# Patient Record
Sex: Male | Born: 1955 | Race: White | Hispanic: No | Marital: Married | Smoking: Current every day smoker
Health system: Southern US, Community
[De-identification: ages and names within clinical notes are randomized; demographics above are authoritative.]

## PROBLEM LIST (undated history)

## (undated) DIAGNOSIS — E7800 Pure hypercholesterolemia, unspecified: Secondary | ICD-10-CM

## (undated) DIAGNOSIS — N529 Male erectile dysfunction, unspecified: Secondary | ICD-10-CM

## (undated) DIAGNOSIS — E785 Hyperlipidemia, unspecified: Secondary | ICD-10-CM

## (undated) DIAGNOSIS — K603 Anal fistula, unspecified: Secondary | ICD-10-CM

## (undated) DIAGNOSIS — I639 Cerebral infarction, unspecified: Secondary | ICD-10-CM

## (undated) DIAGNOSIS — J449 Chronic obstructive pulmonary disease, unspecified: Secondary | ICD-10-CM

## (undated) DIAGNOSIS — E278 Other specified disorders of adrenal gland: Secondary | ICD-10-CM

## (undated) DIAGNOSIS — K649 Unspecified hemorrhoids: Secondary | ICD-10-CM

## (undated) DIAGNOSIS — E039 Hypothyroidism, unspecified: Secondary | ICD-10-CM

## (undated) DIAGNOSIS — R200 Anesthesia of skin: Secondary | ICD-10-CM

## (undated) DIAGNOSIS — I10 Essential (primary) hypertension: Secondary | ICD-10-CM

## (undated) HISTORY — DX: Other specified disorders of adrenal gland: E27.8

## (undated) HISTORY — DX: Hyperlipidemia, unspecified: E78.5

## (undated) HISTORY — DX: Cerebral infarction, unspecified: I63.9

## (undated) HISTORY — DX: Essential (primary) hypertension: I10

## (undated) HISTORY — DX: Chronic obstructive pulmonary disease, unspecified: J44.9

## (undated) HISTORY — DX: Pure hypercholesterolemia, unspecified: E78.00

## (undated) HISTORY — DX: Male erectile dysfunction, unspecified: N52.9

## (undated) HISTORY — DX: Anesthesia of skin: R20.0

## (undated) HISTORY — PX: COLONOSCOPY: SHX174

---

## 2000-03-31 ENCOUNTER — Encounter (INDEPENDENT_AMBULATORY_CARE_PROVIDER_SITE_OTHER): Payer: Self-pay | Admitting: Gastroenterology

## 2000-03-31 ENCOUNTER — Other Ambulatory Visit: Admission: RE | Admit: 2000-03-31 | Discharge: 2000-03-31 | Payer: Self-pay | Admitting: Gastroenterology

## 2003-04-27 ENCOUNTER — Emergency Department (HOSPITAL_COMMUNITY): Admission: EM | Admit: 2003-04-27 | Discharge: 2003-04-27 | Payer: Self-pay | Admitting: Emergency Medicine

## 2003-11-20 ENCOUNTER — Inpatient Hospital Stay (HOSPITAL_COMMUNITY): Admission: AC | Admit: 2003-11-20 | Discharge: 2003-11-22 | Payer: Self-pay

## 2003-11-21 HISTORY — PX: OTHER SURGICAL HISTORY: SHX169

## 2003-12-27 ENCOUNTER — Ambulatory Visit (HOSPITAL_COMMUNITY): Admission: RE | Admit: 2003-12-27 | Discharge: 2003-12-27 | Payer: Self-pay | Admitting: *Deleted

## 2003-12-27 ENCOUNTER — Ambulatory Visit (HOSPITAL_BASED_OUTPATIENT_CLINIC_OR_DEPARTMENT_OTHER): Admission: RE | Admit: 2003-12-27 | Discharge: 2003-12-27 | Payer: Self-pay | Admitting: *Deleted

## 2003-12-27 HISTORY — PX: OTHER SURGICAL HISTORY: SHX169

## 2005-04-14 ENCOUNTER — Encounter: Payer: Self-pay | Admitting: Gastroenterology

## 2005-08-11 ENCOUNTER — Encounter: Payer: Self-pay | Admitting: Gastroenterology

## 2005-08-11 ENCOUNTER — Inpatient Hospital Stay (HOSPITAL_COMMUNITY): Admission: AD | Admit: 2005-08-11 | Discharge: 2005-08-14 | Payer: Self-pay | Admitting: *Deleted

## 2005-08-11 ENCOUNTER — Encounter (INDEPENDENT_AMBULATORY_CARE_PROVIDER_SITE_OTHER): Payer: Self-pay | Admitting: Specialist

## 2005-08-11 HISTORY — PX: OTHER SURGICAL HISTORY: SHX169
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
## 2006-11-01 ENCOUNTER — Ambulatory Visit: Payer: Self-pay | Admitting: Gastroenterology

## 2006-11-17 ENCOUNTER — Encounter (INDEPENDENT_AMBULATORY_CARE_PROVIDER_SITE_OTHER): Payer: Self-pay | Admitting: *Deleted

## 2006-11-17 ENCOUNTER — Ambulatory Visit: Payer: Self-pay | Admitting: Gastroenterology

## 2009-01-09 ENCOUNTER — Ambulatory Visit: Payer: Self-pay | Admitting: Gastroenterology

## 2009-01-09 DIAGNOSIS — R198 Other specified symptoms and signs involving the digestive system and abdomen: Secondary | ICD-10-CM | POA: Insufficient documentation

## 2009-01-09 DIAGNOSIS — R1031 Right lower quadrant pain: Secondary | ICD-10-CM | POA: Insufficient documentation

## 2009-01-09 DIAGNOSIS — R159 Full incontinence of feces: Secondary | ICD-10-CM | POA: Insufficient documentation

## 2009-01-24 ENCOUNTER — Ambulatory Visit: Payer: Self-pay | Admitting: Gastroenterology

## 2009-01-31 ENCOUNTER — Ambulatory Visit: Payer: Self-pay | Admitting: Gastroenterology

## 2009-01-31 ENCOUNTER — Encounter: Payer: Self-pay | Admitting: Gastroenterology

## 2009-02-04 ENCOUNTER — Telehealth (INDEPENDENT_AMBULATORY_CARE_PROVIDER_SITE_OTHER): Payer: Self-pay

## 2009-02-06 ENCOUNTER — Ambulatory Visit: Payer: Self-pay | Admitting: Internal Medicine

## 2009-02-08 ENCOUNTER — Encounter: Payer: Self-pay | Admitting: Gastroenterology

## 2009-02-10 ENCOUNTER — Telehealth (INDEPENDENT_AMBULATORY_CARE_PROVIDER_SITE_OTHER): Payer: Self-pay

## 2009-02-21 ENCOUNTER — Ambulatory Visit: Payer: Self-pay | Admitting: Endocrinology

## 2009-02-21 DIAGNOSIS — E278 Other specified disorders of adrenal gland: Secondary | ICD-10-CM | POA: Insufficient documentation

## 2009-02-24 ENCOUNTER — Encounter: Payer: Self-pay | Admitting: Internal Medicine

## 2009-02-24 ENCOUNTER — Telehealth (INDEPENDENT_AMBULATORY_CARE_PROVIDER_SITE_OTHER): Payer: Self-pay | Admitting: *Deleted

## 2009-03-03 ENCOUNTER — Ambulatory Visit: Payer: Self-pay | Admitting: Gastroenterology

## 2009-03-03 DIAGNOSIS — K623 Rectal prolapse: Secondary | ICD-10-CM

## 2009-03-03 DIAGNOSIS — R933 Abnormal findings on diagnostic imaging of other parts of digestive tract: Secondary | ICD-10-CM

## 2009-03-10 LAB — CONVERTED CEMR LAB
Catecholamines Tot(E+NE) 24 Hr U: 0.049 mg/24hr
Metaneph Total, Ur: 427 ug/24hr (ref 224–832)

## 2009-04-02 ENCOUNTER — Telehealth: Payer: Self-pay | Admitting: Endocrinology

## 2009-04-18 ENCOUNTER — Ambulatory Visit: Payer: Self-pay | Admitting: Endocrinology

## 2009-04-29 ENCOUNTER — Telehealth: Payer: Self-pay | Admitting: Endocrinology

## 2009-05-01 ENCOUNTER — Ambulatory Visit: Payer: Self-pay | Admitting: Gastroenterology

## 2009-05-06 ENCOUNTER — Ambulatory Visit: Payer: Self-pay | Admitting: Cardiology

## 2009-05-15 ENCOUNTER — Encounter: Payer: Self-pay | Admitting: Gastroenterology

## 2009-05-15 ENCOUNTER — Ambulatory Visit: Payer: Self-pay | Admitting: Gastroenterology

## 2009-05-18 ENCOUNTER — Encounter: Payer: Self-pay | Admitting: Gastroenterology
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
## 2011-01-01 NOTE — Op Note (Signed)
NAME:  Todd Elliott, Todd Elliott               ACCOUNT NO.:  [id number]   MEDICAL RECORD NO.:  [id number]          PATIENT TYPE:  AMB   LOCATION:  DAY                          FACILITY:  [facility]   PHYSICIAN:  Vikki Ports, MDDATE OF BIRTH:  01/15/1956   DATE OF PROCEDURE:  08/11/2005  DATE OF DISCHARGE:                                 OPERATIVE REPORT   PREOPERATIVE DIAGNOSIS:  Acute appendicitis, probable rupture.   POSTOPERATIVE DIAGNOSIS:  Gangrenous ruptured appendicitis.   PROCEDURE:  Laparoscopy converted to open ileocecostomy.   SURGEON:  Danna Hefty, MD.   ASSISTANTWilmon Arms. Tsuei, MD.   ANESTHESIA:  General.   DESCRIPTION OF PROCEDURE:  The patient was taken to the operating room,  placed in supine position and after adequate general anesthesia was induced  using endotracheal tube, the abdomen was prepped and draped in a normal  sterile fashion. Using a 12 mm Optiview trocar in the left upper quadrant,  peritoneal access was obtained using direct vision. Pneumoperitoneum was  obtained and additional 12 and 5 mm trocars were placed in the left abdomen.  The right lower quadrant was inspected and a gangrenous appendix was  visualized. The tip of the appendix was inspected and mobilized. The  mesoappendix was very stuck as was the cecum. On mobilization, an area of  purulence and abscess was encountered. There was obvious fecal contamination  and obvious perforation was encountered as well. At this point, I decided to  convert to an open operation.   I then made a right lower quadrant transverse incision. Dissecting down  through the skin and dermis, I went through the fascia and muscle using  Bovie electrocautery and entered the peritoneum. The right colon was  mobilized. The cecum was densely adhered to the omentum and to the right  paracolic gutter. After mobilizing this with great caution, there was  significant fibrosis and inflammation. The terminal ileum  was transected  using a GIA stapling device and the mesentery was taken down with the  harmonic scalpel. An area of noninflamed right colon which had been  mobilized was transected using the GIA stapling device and the ileocecum was  sent for pathologic evaluation along with the perforated appendix. A side-to-  side ileocolostomy was then performed in the standard fashion using a GIA 75  stapling device and a TA-60 stapling device. The anastomosis was reinforced  using Tisseel. The mesenteric defect was closed with interrupted 2-0 silk  ligatures. The right lower quadrant was copiously irrigated with 3 liters  of saline solution. The fascia was closed in two layers with running #1  Novofil and skin was closed loosely with staples and packed with a Teflon  wicks. The patient tolerated the procedure well and went to PACU in good  condition.      Vikki Ports, MD  Electronically Signed     KRH/MEDQ  D:  08/11/2005  T:  08/12/2005  Job:  [id number]

## 2011-01-01 NOTE — Op Note (Signed)
NAME:  Todd Elliott, Todd Elliott                         ACCOUNT NO.:  [id number]   MEDICAL RECORD NO.:  [id number]                   PATIENT TYPE:  AMB   LOCATION:  DSC                                  FACILITY:  [facility]   PHYSICIAN:  Lowell Bouton, M.D.      DATE OF BIRTH:  02/04/1956   DATE OF PROCEDURE:  DATE OF DISCHARGE:                                 OPERATIVE REPORT   PREOPERATIVE DIAGNOSIS:  Fractured left scaphoid.   POSTOPERATIVE DIAGNOSIS:  Fractured left scaphoid.   PROCEDURE:  Open reduction internal fixation, left scaphoid fracture.   SURGEON:  Lowell Bouton, M.D.   ANESTHESIA:  General.   OPERATIVE FINDINGS:  The patient had a mid shaft scaphoid fracture, that was  minimally displaced.  The fracture was about four weeks old.   PROCEDURE:  Under general anesthesia, with a tourniquet on the left arm, the  left hand was prepped and draped in the usual fashion.  After exsanguinating  the limb, the tourniquet was inflated to 250 mmHg.  A longitudinal incision  was made along the FCR tendon volarly and then oblique to cross the thenar  eminence and the wrist crease.  Sharp dissection was carried through the  subcutaneous tissues and bleeding points were coagulated.  Blunt dissection  was carried down to the FCR tendon sheath, and the sheath was opened with  the scissors.  The tendon was then retracted ulnarly and the floor of the  sheath was incised sharply.  Dissection was carried down onto the scaphoid  tuberosity and a knife was used to elevate the periosteum.  A branch of the  radial artery was tied off with a 4-0 chromic suture that crossed right at  the fracture.  The fracture site was then identified and a Therapist, nutritional  was inserted in the radiocarpal joint.  The fracture was comminuted ulnarly,  but aligned well radially.  It was felt that an AccuTrac screw would be  appropriate and supply good compression.  There were no cystic changes in  the  bone that I could note volarly.  A K-wire was then placed through the  scaphoid tuberosity across the fracture site and into the proximal pole.  X-  ray showed good alignment with good centralization of the K-wire.  A second  K-wire using a 3.5 wire was then placed obliquely across the fracture to  keep the fracture from rotating.  The AccuTrac drill was then placed over  the first K-wire and under power, the track was drilled using the AccuTrac  drill.  Size 27.5 mm screw was then inserted over the K-wire.  X-rays showed  excellent alignment and there was good compression of the fracture site.  It  was determined that no bone graft would be required as there was no defect  in the fracture.  The rotational K-wire was then removed and the screw was  inserted a few turns further.  X-rays showed good alignment in  both the PA,  lateral and oblique views.  The wound was then irrigated copiously with  saline.  A vessel loop drain  was left in for drainage.  Subcutaneous tissues were closed with 4-0 Vicryl,  and the skin with a 3-0 subcuticular Prolene.  Steri-Strips were applied,  followed by dressings and a thumb spica splint.  The patient tolerated the  procedure well, and went to the recovery room awake, stable and in good  condition.                                               Lowell Bouton, M.D.    EMM/MEDQ  D:  12/27/2003  T:  12/28/2003  Job:  [id number]   cc:   Nadara Mustard, M.D.  Fax: [phone]

## 2011-01-01 NOTE — Discharge Summary (Signed)
NAME:  Todd Elliott, Todd Elliott               ACCOUNT NO.:  [id number]   MEDICAL RECORD NO.:  [id number]          PATIENT TYPE:  INP   LOCATION:  1518                         FACILITY:  [facility]   PHYSICIAN:  Vikki Ports, MDDATE OF BIRTH:  08-20-1955   DATE OF ADMISSION:  08/11/2005  DATE OF DISCHARGE:  08/14/2005                                 DISCHARGE SUMMARY   ADMISSION DIAGNOSIS:  Ruptured appendicitis.   DISCHARGE DIAGNOSIS:  Ruptured appendicitis   CONDITION ON DISCHARGE:  Good and improved.   DISPOSITION:  Discharged to home.   MEDICATIONS ON DISCHARGE:  Levothyroxine, Cipro, Flagyl, and Vicodin.   FOLLOW-UP:  With me in four to five days.   HOSPITAL COURSE:  Patient was admitted with a CT diagnosis of ruptured  appendicitis.  He was taken to the operating room.  Attempt at laparoscopic  appendectomy was performed.  The patient had rupture and repair of the  appendiceal base of the cecum.  He was converted to an open ileocecostomy.  He was maintained on IV antibiotics and his diet was advanced over the  following two to three days.  Patient was doing very well, tolerating a  regular diet.  He was a afebrile for over 24 hours, had had a bowel  movement, was anxious to go home.  Wound care instructions were given.  He  was changed to p.o. antibiotics and was discharged home with instructions to  call for a fever of over 101 or any change in his symptomatology.  His white  count was decreased to 12,000 at the time of discharge.      Vikki Ports, MD  Electronically Signed     KRH/MEDQ  D:  08/14/2005  T:  08/16/2005  Job:  [id number]

## 2011-01-01 NOTE — H&P (Signed)
NAME:  Todd Elliott, Todd Elliott               ACCOUNT NO.:  [id number]   MEDICAL RECORD NO.:  [id number]          PATIENT TYPE:  AMB   LOCATION:  DAY                          [facility]ACILITY:  [facility]   PHYSICIAN:  Vikki Ports, MDDATE OF BIRTH:  June 06, 1956   DATE OF ADMISSION:  08/11/2005  DATE OF DISCHARGE:                                HISTORY & PHYSICAL   HISTORY OF PRESENT ILLNESS:  The patient is a 55 year old white male who  began with right upper quadrant pain localizing to the right lower quadrant.  He was seen by Dr. Herb Grays earlier today with an onset of pain six days  ago in the right lower quadrant which then localized about three days ago to  the right lower quadrant.  The patient had increasing pain over the last 48  hours and was seen in her office today.  He had a white count of 11,000 and  a temperature of 99 degrees.  He was sent for CT scan which showed acute  appendicitis with early rupture.  He was sent to the [facility]  emergency room and prepared for laparoscopic appendectomy.  The patient has  complained of anorexia, did have one episode of emesis.  He did use on  laxative when he complained of constipation.   PAST MEDICAL HISTORY:  Significant only for urolithiasis.  He does have a  questionable history of cholelithiasis.   MEDICATIONS:  None.   ALLERGIES:  None.   SOCIAL HISTORY:  Negative for tobacco and alcohol use.   FAMILY HISTORY:  Noncontributory.   PHYSICAL EXAMINATION:  VITAL SIGNS:  His temperature was 99 degrees.  His  heart rate was 88.  Respiratory rate was 18.  Blood pressure was 146/72.  GENERAL:  He is an age appropriate white male in minimal distress.  HEENT:  Benign.  Normocephalic, atraumatic.  Pupils were equal, round,  reactive to light.  NECK:  Supple and soft without thyromegaly or cervical adenopathy.  LUNGS:  Clear to auscultation and percussion x2.  ABDOMEN:  Soft and tender in the right lower quadrant to minimal  palpation  with localized peritonitis.  EXTREMITIES:  Without clubbing, cyanosis, or edema.  NEUROLOGIC:  Grossly intact.   White count was 11,000.   IMPRESSION:  Acute appendicitis with early rupture.   PLAN OF ACTION:  Laparoscopic appendectomy with possible open appendectomy.  Risks and indications, benefits and options were explained to the patient  and he wishes to proceed.      Vikki Ports, MD  Electronically Signed     KRH/MEDQ  D:  08/11/2005  T:  08/11/2005  Job:  [id number]
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
## 2011-08-17 HISTORY — PX: CHOLECYSTECTOMY: SHX55

## 2011-08-17 HISTORY — PX: VENTRAL HERNIA REPAIR: SHX424

## 2011-11-05 ENCOUNTER — Encounter (INDEPENDENT_AMBULATORY_CARE_PROVIDER_SITE_OTHER): Payer: Self-pay | Admitting: General Surgery

## 2013-11-22 ENCOUNTER — Encounter: Payer: Self-pay | Admitting: Gastroenterology

## 2013-12-24 ENCOUNTER — Encounter (HOSPITAL_BASED_OUTPATIENT_CLINIC_OR_DEPARTMENT_OTHER): Payer: Self-pay | Admitting: Emergency Medicine

## 2013-12-24 ENCOUNTER — Emergency Department (HOSPITAL_BASED_OUTPATIENT_CLINIC_OR_DEPARTMENT_OTHER)
Admission: EM | Admit: 2013-12-24 | Discharge: 2013-12-24 | Disposition: A | Discharge status: Home / Self Care | Payer: BC Managed Care – PPO | Attending: Emergency Medicine | Admitting: Emergency Medicine

## 2013-12-24 DIAGNOSIS — E079 Disorder of thyroid, unspecified: Secondary | ICD-10-CM | POA: Insufficient documentation

## 2013-12-24 DIAGNOSIS — K644 Residual hemorrhoidal skin tags: Secondary | ICD-10-CM | POA: Insufficient documentation

## 2013-12-24 DIAGNOSIS — F17200 Nicotine dependence, unspecified, uncomplicated: Secondary | ICD-10-CM | POA: Insufficient documentation

## 2013-12-24 DIAGNOSIS — R198 Other specified symptoms and signs involving the digestive system and abdomen: Secondary | ICD-10-CM | POA: Insufficient documentation

## 2013-12-24 HISTORY — DX: Unspecified hemorrhoids: K64.9

## 2013-12-24 MED ORDER — LIDOCAINE HCL 2 % EX GEL
Freq: Once | CUTANEOUS | Status: AC
  Administered 2013-12-24: 1 via TOPICAL
  Filled 2013-12-24: qty 20

## 2013-12-24 NOTE — ED Notes (Signed)
Pt reports 2 weeks of rectal pressure.  Went to [facility] and was sent here for further eval.  Hx of hemorrhoids.  States stools are small semiformed.  Reports hemoccult at [facility] was positive.

## 2013-12-24 NOTE — Discharge Instructions (Signed)
Start doing a warm water soak several times daily, especially when your pain is worsening - this may relieve your symptoms.  Follow up with your doctor at [facility] as discussed.  Also,  You may try your flexeril  - this may also be helpful.

## 2013-12-24 NOTE — ED Provider Notes (Signed)
CSN: [id number]     Arrival date & time 12/24/13  1505 History   First MD Initiated Contact with Patient 12/24/13 1746     Chief Complaint  Patient presents with  . Rectal Pain     (Consider location/radiation/quality/duration/timing/severity/associated sxs/prior Treatment) The history is provided by the patient.   Todd Elliott is a 58 y.o. male presenting with rectal pain which started early this am,  Waking him up.  He describes intermittent episodes of severe, sharp, cramping pain and pressure in his rectum which have been recurring, with several similar episodes over the past month.  He has a history of hemorrhoids with a hemorrhoidectomy many years ago, and reports he was able to palpate a swelling along his right medial buttock near his rectum when the pain was severe this am, but since the swelling has resolved.  He still has soreness at this location with palpation,  Stating it feels "bruise like".  He denies problems with constipation or diarrhea, had a small stool today without change in symptoms.  He denies back pain, dysuria, hematuria and rectal bleeding. He was seen by his pcp today, however,  Was found to be hemoccult positive and was sent here for further testing.  He has had screening colonoscopies, most recently 4 years ago and states he is due for one this year.     Past Medical History  Diagnosis Date  . Hemorrhoid   . Thyroid disease    Past Surgical History  Procedure Laterality Date  . Arm surgery    . Appendectomy    . Abdominal surgery    . Gastric resection    . Hernia repair     No family history on file. History  Substance Use Topics  . Smoking status: Light Tobacco Smoker  . Smokeless tobacco: Not on file  . Alcohol Use: Yes    Review of Systems  Constitutional: Negative for fever.  HENT: Negative for congestion and sore throat.   Eyes: Negative.   Respiratory: Negative for chest tightness and shortness of breath.   Cardiovascular: Negative  for chest pain.  Gastrointestinal: Positive for rectal pain. Negative for nausea, abdominal pain, diarrhea, constipation and anal bleeding.  Genitourinary: Negative.   Musculoskeletal: Negative for arthralgias, joint swelling and neck pain.  Skin: Negative.  Negative for rash and wound.  Neurological: Negative for dizziness, weakness, light-headedness, numbness and headaches.  Psychiatric/Behavioral: Negative.       Allergies  Review of patient's allergies indicates no known allergies.  Home Medications   Prior to Admission medications   Medication Sig Start Date End Date Taking? Authorizing Provider  levothyroxine (SYNTHROID, LEVOTHROID) 175 MCG tablet Take 175 mcg by mouth daily before breakfast.   Yes Historical Provider, MD   BP 167/99  Pulse 94  Temp(Src) 98.7 F (37.1 C) (Oral)  Resp 18  Ht 5\' 10"  (1.778 m)  Wt 218 lb (98.884 kg)  BMI 31.28 kg/m2  SpO2 100% Physical Exam  Nursing note and vitals reviewed. Constitutional: He appears well-developed and well-nourished.  HENT:  Head: Normocephalic and atraumatic.  Eyes: Conjunctivae are normal.  Neck: Normal range of motion.  Cardiovascular: Normal rate, regular rhythm, normal heart sounds and intact distal pulses.   Pulmonary/Chest: Effort normal and breath sounds normal. He has no wheezes.  Abdominal: Soft. Bowel sounds are normal. He exhibits no distension and no mass. There is no tenderness. There is no rebound and no guarding.  Genitourinary: Rectal exam shows external hemorrhoid. Rectal exam shows no  internal hemorrhoid, no fissure, no mass, no tenderness and anal tone normal. Prostate is not enlarged and not tender. Cremasteric reflex is present.  Small external hemorrhoidal flap,  Not tense,  No hematoma, more consistent with a healed old hemorrhoid rather than a new one.  No internal hemorrhoid appreciated.  Prostate nontender.  TTP at left buttock around the 5 oclock position near the anus.  No edema, fluctuance,  erythema or induration.  Few scattered folliculitis lesions but no abscess.    Musculoskeletal: Normal range of motion.  Neurological: He is alert.  Skin: Skin is warm and dry.  Psychiatric: He has a normal mood and affect.    ED Course  Procedures (including critical care time) Labs Review Labs Reviewed - No data to display  Imaging Review No results found.   EKG Interpretation None      MDM   Final diagnoses:  Rectal tenesmus    Discussed with Dr. Doy Mince prior to dc home.  Pt was given topical lidocaine jelly with small amount applied to rectal area.  He did obtain partial pain relief.  At time of dc,  His pain was improved,  Stating no pain with lying and standing, only with sitting.  He was offered pain medicine prescription which he declined.  Plan to f/u with [facility] for further evaluation of these intermittent episodes of pain.  No appreciable hemorrhoidal findings to suggest this as source of pain.  No abscess, no hx of falls to suggest pelvic bony source of pain.  Suspect rectal tenesmus. He does have prescription of flexeril for prn back pain,  Suggested he try this medicine, may also do warm sitz baths for muscle spasm relief.      Evalee Jefferson, PA-C 12/25/13 2312

## 2013-12-25 ENCOUNTER — Telehealth: Payer: Self-pay | Admitting: Gastroenterology

## 2013-12-25 NOTE — Telephone Encounter (Signed)
Patient reports rectal pain and pressure.  States he can't sit.  He was seen in the ER but note not completed.  He was given lidocaine and not able to tolerate the pain.  He will come in and see Alonza Bogus, PA tomorrow at 2;00

## 2013-12-26 ENCOUNTER — Ambulatory Visit (INDEPENDENT_AMBULATORY_CARE_PROVIDER_SITE_OTHER): Payer: BC Managed Care – PPO | Admitting: Gastroenterology

## 2013-12-26 ENCOUNTER — Encounter: Payer: Self-pay | Admitting: Gastroenterology

## 2013-12-26 ENCOUNTER — Other Ambulatory Visit (INDEPENDENT_AMBULATORY_CARE_PROVIDER_SITE_OTHER): Payer: BC Managed Care – PPO

## 2013-12-26 ENCOUNTER — Ambulatory Visit (INDEPENDENT_AMBULATORY_CARE_PROVIDER_SITE_OTHER)
Admission: RE | Admit: 2013-12-26 | Discharge: 2013-12-26 | Disposition: A | Discharge status: Home / Self Care | Payer: BC Managed Care – PPO | Source: Ambulatory Visit | Attending: Gastroenterology | Admitting: Gastroenterology

## 2013-12-26 VITALS — BP 124/70 | HR 70 | Ht 70.0 in | Wt 215.4 lb

## 2013-12-26 DIAGNOSIS — K6289 Other specified diseases of anus and rectum: Secondary | ICD-10-CM

## 2013-12-26 DIAGNOSIS — Z860100 Personal history of colon polyps, unspecified: Secondary | ICD-10-CM | POA: Insufficient documentation

## 2013-12-26 DIAGNOSIS — Z1211 Encounter for screening for malignant neoplasm of colon: Secondary | ICD-10-CM

## 2013-12-26 LAB — BASIC METABOLIC PANEL
BUN: 17 mg/dL (ref 6–23)
CO2: 29 mEq/L (ref 19–32)
CREATININE: 1.1 mg/dL (ref 0.4–1.5)
Calcium: 9.4 mg/dL (ref 8.4–10.5)
Chloride: 104 mEq/L (ref 96–112)
GFR: 73.94 mL/min (ref >60.00)
GLUCOSE: 92 mg/dL (ref 70–99)
POTASSIUM: 3.9 meq/L (ref 3.5–5.1)
Sodium: 140 mEq/L (ref 135–145)

## 2013-12-26 LAB — CBC
HEMATOCRIT: 45.9 % (ref 39.0–52.0)
HEMOGLOBIN: 15.5 g/dL (ref 13.0–17.0)
MCHC: 33.8 g/dL (ref 30.0–36.0)
MCV: 88.8 fl (ref 78.0–100.0)
PLATELETS: 305 10*3/uL (ref 150.0–400.0)
RBC: 5.17 Mil/uL (ref 4.22–5.81)
RDW: 12.7 % (ref 11.5–15.5)
WBC: 12.7 10*3/uL — ABNORMAL HIGH (ref 4.0–10.5)

## 2013-12-26 MED ORDER — TRAMADOL HCL 50 MG PO TABS: 50.0000 mg | ORAL_TABLET | Freq: Four times a day (QID) | ORAL | Status: DC | PRN

## 2013-12-26 MED ORDER — MOVIPREP 100 G PO SOLR: 1.0000 | Freq: Once | ORAL | Status: DC

## 2013-12-26 MED ORDER — IOHEXOL 300 MG/ML  SOLN
80.0000 mL | Freq: Once | INTRAMUSCULAR | Status: AC | PRN
  Administered 2013-12-26: 80 mL via INTRAVENOUS

## 2013-12-26 NOTE — Patient Instructions (Signed)
Your physician has requested that you go to the basement for the following lab work before leaving today: CBC BMET  We have sent the following medications to your pharmacy for you to pick up at your convenience: Tramadol 50 mg, please take one tablet by mouth as needed for pain ___________________________________________________________________________________________ You have been scheduled for a CT scan of the pelvis at [facility] ([address]---this is in the same building as Press [facility]).   You are scheduled on 12-26-2013 at 4 pm. You should arrive 15 minutes prior to your appointment time for registration. Please follow the written instructions below on the day of your exam:  WARNING: IF YOU ARE ALLERGIC TO IODINE/X-RAY DYE, PLEASE NOTIFY RADIOLOGY IMMEDIATELY AT [phone]! YOU WILL BE GIVEN A 13 HOUR PREMEDICATION PREP.  1) Do not eat or drink anything. 2) You have been given 2 bottles of oral contrast to drink. The solution may taste better if refrigerated, but do NOT add ice or any other liquid to this solution. Shake well before drinking.   Drank the contrast-one bottle here  You may take any medications as prescribed with a small amount of water except for the following: Metformin, Glucophage, Glucovance, Avandamet, Riomet, Fortamet, Actoplus Met, Janumet, Glumetza or Metaglip. The above medications must be held the day of the exam AND 48 hours after the exam.  The purpose of you drinking the oral contrast is to aid in the visualization of your intestinal tract. The contrast solution may cause some diarrhea. Before your exam is started, you will be given a small amount of fluid to drink. Depending on your individual set of symptoms, you may also receive an intravenous injection of x-ray contrast/dye. Plan on being at [facility] for 30 minutes or long, depending on the type of exam you are having performed.  This test typically takes 30-45 minutes to  complete.  If you have any questions regarding your exam or if you need to reschedule, you may call the CT department at [phone] between the hours of 8:00 am and 5:00 pm, Monday-Friday.  ________________________________________________________________________

## 2013-12-26 NOTE — Progress Notes (Addendum)
12/26/2013 Todd Elliott [id number] Sep 02, 1955   HISTORY OF PRESENT ILLNESS:  This is a pleasant 58 year old male who is known to Dr. Fuller Plan for previous colonoscopies.  Last colonoscopy was in June 2010 at which time he was found to have a nodular and firm mucosa in the rectum, along with 4 polyps in the sigmoid colon, and internal hemorrhoids.  The sigmoid polyps were hyperplastic. Biopsy the rectum showed findings consistent with mild mucosal prolapse without significant active inflammation, etc. He then again underwent a flexible sigmoidoscopy in September 2010 at which time the nodular mucosa was also noted in the rectum.  At that time biopsies revealed polypoid fragments of rectal mucosa with hyperplastic/degenerative changes, focal ulceration, inflammation and superficial hemorrhages in the lamina propria and a few fragments of surface ulceration and focal active mucosal proctitis. From what I can tell he has never had any adenomatous polyps, but he has had several polyps on different occasions. He does have a family history of colon cancer in his father who was diagnosed at age 54. He has been getting surveillance colonoscopies approximately every 5 years with repeat recommended in June 2015.  He presents to our office today with complaints of rectal pain that began on Monday and woke him from sleep around 3 AM. He states that a couple weeks ago he was having issues with what he thought were his hemorrhoids and began using hemorrhoid suppositories. Symptoms seemed to subside, but then Monday he was woken by this pain. The patient reports that the pain was initially rated as a 12/10 on the pain scale. He cannot sit without significant pain. He went to the emergency department on Monday, May 11 where they performed a rectal exam and they told him that he likely had rectal spasm/tenesmus. He was given topical lidocaine jelly to use, but says that he has not received any relief from that. He was offered  pain medication but declined. He says that the pain feels as if someone shoves a golf ball inside his rectum and feels like a fullness/pressure. He denies any burning pain and there is really not much pain with a bowel movement. He says having a bowel movement actually somewhat relieves the discomfort. He has seen some pinkish colored blood mixed with mucus coming from his rectum as well.  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    Past Medical History  Diagnosis Date  . Hemorrhoid   . Thyroid disease    Past Surgical History  Procedure Laterality Date  . Arm surgery Right   . Appendectomy    . Abdominal surgery    . Gastric resection    . Hernia repair Right     reports that he has been smoking Cigarettes.  He has been smoking about 0.00 packs per day for the past 35 years. He has never used smokeless tobacco. He reports that he drinks alcohol. He reports that he does not use illicit drugs. family history includes Colon cancer (age of onset: 31) in his father; Diabetes in his maternal aunt, maternal uncle, and another family member; Heart disease in his maternal grandfather. No Known Allergies    Outpatient Encounter Prescriptions as of 12/26/2013  Medication Sig  . levothyroxine (SYNTHROID, LEVOTHROID) 175 MCG tablet Take 175 mcg by mouth daily before breakfast.  . Multiple Vitamins-Minerals (CENTRUM SILVER PO) Take 1 tablet by mouth daily.  . Pyridoxine HCl (B-6 PO) Take 1 tablet by mouth daily.  Marland Kitchen MOVIPREP  100 G SOLR Take 1 kit (200 g total) by mouth once.  . traMADol (ULTRAM) 50 MG tablet Take 1 tablet (50 mg total) by mouth every 6 (six) hours as needed.     REVIEW OF SYSTEMS  : All other systems reviewed and negative except where noted in the History of Present Illness.   PHYSICAL EXAM: BP 124/70  Pulse 70  Ht $R'5\' 10"'da$  (1.778 m)  Wt 215 lb 6.4 oz (97.705 kg)  BMI 30.91 kg/m2 General: Well developed white male in no acute  distress Head: Normocephalic and atraumatic Eyes:  Sclerae anicteric, conjunctiva pink. Ears: Normal auditory acuity Lungs: Clear throughout to auscultation Heart: Regular rate and rhythm Abdomen: Soft, non-distended.  Normal bowel sounds.  Non-tender. Rectal:  Small external hemorrhoid noted.  DRE revealed no massa, but there was tenderness and fullness on the left side of the anal canal.  Also, there was tenderness and almost a swollen area palpated on the left buttock.   Musculoskeletal: Symmetrical with no gross deformities  Skin: No lesions on visible extremities Extremities: No edema  Neurological: Alert oriented x 4, grossly non-focal Psychological:  Alert and cooperative. Normal mood and affect  ASSESSMENT AND PLAN: -Rectal pain:  Unsure of the source.  I am concerned about an abscess although he does not have fever.  Will check labs including a CBC and BMP.  Will check CT scan of the pelvis with contrast.  We will also give him some tramadol for pain to use in the interim. -Personal history of colon polyps:  Surveillance colonoscopy is recommended for June 2015 so we will proceed with scheduling that as well.  The risks, benefits, and alternatives were discussed with the patient and he consents to proceed.

## 2013-12-26 NOTE — Progress Notes (Signed)
Reviewed and agree with management plan.  Johnjoseph Rolfe T. Perina Salvaggio, MD FACG 

## 2013-12-27 ENCOUNTER — Telehealth (INDEPENDENT_AMBULATORY_CARE_PROVIDER_SITE_OTHER): Payer: Self-pay

## 2013-12-27 ENCOUNTER — Telehealth: Payer: Self-pay | Admitting: *Deleted

## 2013-12-27 ENCOUNTER — Encounter (HOSPITAL_COMMUNITY): Payer: Self-pay | Admitting: Emergency Medicine

## 2013-12-27 ENCOUNTER — Emergency Department (HOSPITAL_COMMUNITY): Payer: BC Managed Care – PPO | Admitting: Anesthesiology

## 2013-12-27 ENCOUNTER — Observation Stay (HOSPITAL_COMMUNITY)
Admission: EM | Admit: 2013-12-27 | Discharge: 2013-12-28 | Disposition: A | Discharge status: Home / Self Care | Payer: BC Managed Care – PPO | Attending: General Surgery | Admitting: General Surgery

## 2013-12-27 ENCOUNTER — Encounter (INDEPENDENT_AMBULATORY_CARE_PROVIDER_SITE_OTHER): Payer: BC Managed Care – PPO | Admitting: Surgery

## 2013-12-27 ENCOUNTER — Encounter (HOSPITAL_COMMUNITY): Payer: BC Managed Care – PPO | Admitting: Anesthesiology

## 2013-12-27 ENCOUNTER — Other Ambulatory Visit: Payer: Self-pay | Admitting: *Deleted

## 2013-12-27 ENCOUNTER — Encounter (HOSPITAL_COMMUNITY)
Admission: EM | Disposition: A | Discharge status: Home / Self Care | Payer: Self-pay | Source: Home / Self Care | Attending: Emergency Medicine

## 2013-12-27 DIAGNOSIS — K620 Anal polyp: Secondary | ICD-10-CM

## 2013-12-27 DIAGNOSIS — F17200 Nicotine dependence, unspecified, uncomplicated: Secondary | ICD-10-CM | POA: Insufficient documentation

## 2013-12-27 DIAGNOSIS — Z800 Family history of malignant neoplasm of digestive organs: Secondary | ICD-10-CM | POA: Insufficient documentation

## 2013-12-27 DIAGNOSIS — Z9089 Acquired absence of other organs: Secondary | ICD-10-CM | POA: Insufficient documentation

## 2013-12-27 DIAGNOSIS — K610 Anal abscess: Secondary | ICD-10-CM

## 2013-12-27 DIAGNOSIS — K649 Unspecified hemorrhoids: Secondary | ICD-10-CM | POA: Insufficient documentation

## 2013-12-27 DIAGNOSIS — K621 Rectal polyp: Secondary | ICD-10-CM

## 2013-12-27 DIAGNOSIS — K612 Anorectal abscess: Principal | ICD-10-CM | POA: Insufficient documentation

## 2013-12-27 DIAGNOSIS — E079 Disorder of thyroid, unspecified: Secondary | ICD-10-CM | POA: Insufficient documentation

## 2013-12-27 DIAGNOSIS — K611 Rectal abscess: Secondary | ICD-10-CM | POA: Diagnosis present

## 2013-12-27 HISTORY — PX: INCISION AND DRAINAGE PERIRECTAL ABSCESS: SHX1804

## 2013-12-27 LAB — COMPREHENSIVE METABOLIC PANEL
ALBUMIN: 3.8 g/dL (ref 3.5–5.2)
ALK PHOS: 96 U/L (ref 39–117)
ALT: 21 U/L (ref 0–53)
AST: 36 U/L (ref 0–37)
BUN: 16 mg/dL (ref 6–23)
CALCIUM: 9.7 mg/dL (ref 8.4–10.5)
CO2: 22 mEq/L (ref 19–32)
Chloride: 102 mEq/L (ref 96–112)
Creatinine, Ser: 0.82 mg/dL (ref 0.50–1.35)
GFR calc Af Amer: >90 mL/min (ref >90)
GFR calc non Af Amer: >90 mL/min (ref >90)
GLUCOSE: 95 mg/dL (ref 70–99)
Potassium: 4.7 mEq/L (ref 3.7–5.3)
SODIUM: 138 meq/L (ref 137–147)
TOTAL PROTEIN: 7.1 g/dL (ref 6.0–8.3)
Total Bilirubin: 0.3 mg/dL (ref 0.3–1.2)

## 2013-12-27 LAB — CBC
HCT: 45.1 % (ref 39.0–52.0)
Hemoglobin: 15.6 g/dL (ref 13.0–17.0)
MCH: 30.4 pg (ref 26.0–34.0)
MCHC: 34.6 g/dL (ref 30.0–36.0)
MCV: 87.9 fL (ref 78.0–100.0)
PLATELETS: 251 10*3/uL (ref 150–400)
RBC: 5.13 MIL/uL (ref 4.22–5.81)
RDW: 12.4 % (ref 11.5–15.5)
WBC: 11.9 10*3/uL — ABNORMAL HIGH (ref 4.0–10.5)

## 2013-12-27 LAB — PROTIME-INR
INR: 0.97 (ref 0.00–1.49)
Prothrombin Time: 12.7 seconds (ref 11.6–15.2)

## 2013-12-27 LAB — APTT: aPTT: 33 seconds (ref 24–37)

## 2013-12-27 SURGERY — INCISION AND DRAINAGE, ABSCESS, PERIRECTAL
Anesthesia: General | Site: Perineum | Laterality: Left

## 2013-12-27 MED ORDER — CISATRACURIUM BESYLATE 20 MG/10ML IV SOLN
INTRAVENOUS | Status: AC
  Filled 2013-12-27: qty 10

## 2013-12-27 MED ORDER — CISATRACURIUM BESYLATE (PF) 10 MG/5ML IV SOLN
INTRAVENOUS | Status: DC | PRN
  Administered 2013-12-27: 4 mg via INTRAVENOUS

## 2013-12-27 MED ORDER — 0.9 % SODIUM CHLORIDE (POUR BTL) OPTIME
TOPICAL | Status: DC | PRN
  Administered 2013-12-27: 1000 mL

## 2013-12-27 MED ORDER — FENTANYL CITRATE 0.05 MG/ML IJ SOLN
INTRAMUSCULAR | Status: AC
  Filled 2013-12-27: qty 5

## 2013-12-27 MED ORDER — LIDOCAINE HCL (CARDIAC) 20 MG/ML IV SOLN
INTRAVENOUS | Status: AC
  Filled 2013-12-27: qty 5

## 2013-12-27 MED ORDER — NEOSTIGMINE METHYLSULFATE 10 MG/10ML IV SOLN
INTRAVENOUS | Status: DC | PRN
  Administered 2013-12-27: 3 mg via INTRAVENOUS

## 2013-12-27 MED ORDER — DEXTROSE 5 % IV SOLN
INTRAVENOUS | Status: AC
  Filled 2013-12-27: qty 2

## 2013-12-27 MED ORDER — MIDAZOLAM HCL 5 MG/5ML IJ SOLN
INTRAMUSCULAR | Status: DC | PRN
  Administered 2013-12-27: 2 mg via INTRAVENOUS

## 2013-12-27 MED ORDER — MEPERIDINE HCL 50 MG/ML IJ SOLN: 6.2500 mg | INTRAMUSCULAR | Status: DC | PRN

## 2013-12-27 MED ORDER — ONDANSETRON HCL 4 MG/2ML IJ SOLN
INTRAMUSCULAR | Status: AC
  Filled 2013-12-27: qty 2

## 2013-12-27 MED ORDER — PROPOFOL 10 MG/ML IV BOLUS
INTRAVENOUS | Status: AC
  Filled 2013-12-27: qty 20

## 2013-12-27 MED ORDER — BUPIVACAINE-EPINEPHRINE (PF) 0.25% -1:200000 IJ SOLN
INTRAMUSCULAR | Status: DC | PRN
  Administered 2013-12-27: 30 mL

## 2013-12-27 MED ORDER — PROPOFOL 10 MG/ML IV BOLUS
INTRAVENOUS | Status: DC | PRN
  Administered 2013-12-27: 200 mg via INTRAVENOUS
  Administered 2013-12-27: 70 mg via INTRAVENOUS

## 2013-12-27 MED ORDER — LACTATED RINGERS IV SOLN
INTRAVENOUS | Status: DC | PRN
  Administered 2013-12-27: 13:00:00 via INTRAVENOUS

## 2013-12-27 MED ORDER — ACETAMINOPHEN 650 MG RE SUPP: 650.0000 mg | Freq: Four times a day (QID) | RECTAL | Status: DC | PRN

## 2013-12-27 MED ORDER — BUPIVACAINE-EPINEPHRINE (PF) 0.25% -1:200000 IJ SOLN
INTRAMUSCULAR | Status: AC
  Filled 2013-12-27: qty 30

## 2013-12-27 MED ORDER — LACTATED RINGERS IV SOLN: INTRAVENOUS | Status: DC

## 2013-12-27 MED ORDER — FENTANYL CITRATE 0.05 MG/ML IJ SOLN: 25.0000 ug | INTRAMUSCULAR | Status: DC | PRN

## 2013-12-27 MED ORDER — MORPHINE SULFATE 10 MG/ML IJ SOLN: 2.0000 mg | INTRAMUSCULAR | Status: DC | PRN

## 2013-12-27 MED ORDER — PIPERACILLIN-TAZOBACTAM 3.375 G IVPB
3.3750 g | Freq: Three times a day (TID) | INTRAVENOUS | Status: DC
  Administered 2013-12-27 – 2013-12-28 (×2): 3.375 g via INTRAVENOUS
  Filled 2013-12-27 (×4): qty 50

## 2013-12-27 MED ORDER — GLYCOPYRROLATE 0.2 MG/ML IJ SOLN
INTRAMUSCULAR | Status: AC
  Filled 2013-12-27: qty 2

## 2013-12-27 MED ORDER — CEFOXITIN SODIUM 1 G IV SOLR
1.0000 g | INTRAVENOUS | Status: DC | PRN
  Administered 2013-12-27: 2 g via INTRAVENOUS

## 2013-12-27 MED ORDER — FENTANYL CITRATE 0.05 MG/ML IJ SOLN
INTRAMUSCULAR | Status: DC | PRN
  Administered 2013-12-27: 100 ug via INTRAVENOUS

## 2013-12-27 MED ORDER — MIDAZOLAM HCL 2 MG/2ML IJ SOLN
INTRAMUSCULAR | Status: AC
Start: 2013-12-27 — End: 2013-12-27
  Filled 2013-12-27: qty 2

## 2013-12-27 MED ORDER — ONDANSETRON HCL 4 MG/2ML IJ SOLN
INTRAMUSCULAR | Status: DC | PRN
  Administered 2013-12-27: 4 mg via INTRAVENOUS

## 2013-12-27 MED ORDER — OXYCODONE HCL 5 MG PO TABS
5.0000 mg | ORAL_TABLET | ORAL | Status: DC | PRN
  Administered 2013-12-28 (×2): 5 mg via ORAL
  Filled 2013-12-27 (×2): qty 1

## 2013-12-27 MED ORDER — DIPHENHYDRAMINE HCL 12.5 MG/5ML PO ELIX: 12.5000 mg | ORAL_SOLUTION | Freq: Four times a day (QID) | ORAL | Status: DC | PRN

## 2013-12-27 MED ORDER — GLYCOPYRROLATE 0.2 MG/ML IJ SOLN
INTRAMUSCULAR | Status: DC | PRN
  Administered 2013-12-27: .4 mg via INTRAVENOUS

## 2013-12-27 MED ORDER — DIPHENHYDRAMINE HCL 50 MG/ML IJ SOLN: 12.5000 mg | Freq: Four times a day (QID) | INTRAMUSCULAR | Status: DC | PRN

## 2013-12-27 MED ORDER — ONDANSETRON HCL 4 MG/2ML IJ SOLN: 4.0000 mg | Freq: Four times a day (QID) | INTRAMUSCULAR | Status: DC | PRN

## 2013-12-27 MED ORDER — ACETAMINOPHEN 325 MG PO TABS: 650.0000 mg | ORAL_TABLET | Freq: Four times a day (QID) | ORAL | Status: DC | PRN

## 2013-12-27 MED ORDER — PROMETHAZINE HCL 25 MG/ML IJ SOLN: 6.2500 mg | INTRAMUSCULAR | Status: DC | PRN

## 2013-12-27 MED ORDER — LIDOCAINE HCL (CARDIAC) 20 MG/ML IV SOLN
INTRAVENOUS | Status: DC | PRN
  Administered 2013-12-27: 50 mg via INTRAVENOUS

## 2013-12-27 MED ORDER — LEVOTHYROXINE SODIUM 175 MCG PO TABS
175.0000 ug | ORAL_TABLET | Freq: Every day | ORAL | Status: DC
  Administered 2013-12-28: 175 ug via ORAL
  Filled 2013-12-27 (×2): qty 1

## 2013-12-27 MED ORDER — SUCCINYLCHOLINE CHLORIDE 20 MG/ML IJ SOLN
INTRAMUSCULAR | Status: DC | PRN
  Administered 2013-12-27: 140 mg via INTRAVENOUS

## 2013-12-27 SURGICAL SUPPLY — 33 items
BLADE HEX COATED 2.75 (ELECTRODE) ×2 IMPLANT
BLADE SURG 15 STRL LF DISP TIS (BLADE) ×1 IMPLANT
BLADE SURG 15 STRL SS (BLADE) ×2
CANISTER SUCTION 2500CC (MISCELLANEOUS) ×1 IMPLANT
COVER SURGICAL LIGHT HANDLE (MISCELLANEOUS) ×3 IMPLANT
DRSG TELFA 3X8 NADH (GAUZE/BANDAGES/DRESSINGS) ×2 IMPLANT
ELECT REM PT RETURN 9FT ADLT (ELECTROSURGICAL) ×2
ELECTRODE REM PT RTRN 9FT ADLT (ELECTROSURGICAL) ×1 IMPLANT
GAUZE PACKING IODOFORM 1/2 (PACKING) ×1 IMPLANT
GAUZE PACKING IODOFORM 1/4X15 (GAUZE/BANDAGES/DRESSINGS) ×1 IMPLANT
GAUZE SPONGE 4X4 16PLY XRAY LF (GAUZE/BANDAGES/DRESSINGS) ×2 IMPLANT
GLOVE BIOGEL PI IND STRL 7.0 (GLOVE) ×1 IMPLANT
GLOVE BIOGEL PI INDICATOR 7.0 (GLOVE) ×2
GLOVE ECLIPSE 6.5 STRL STRAW (GLOVE) ×1 IMPLANT
GLOVE SS BIOGEL STRL SZ 7.5 (GLOVE) IMPLANT
GLOVE SUPERSENSE BIOGEL SZ 7.5 (GLOVE) ×1
GOWN STRL REUS W/TWL LRG LVL3 (GOWN DISPOSABLE) ×1 IMPLANT
GOWN STRL REUS W/TWL XL LVL3 (GOWN DISPOSABLE) ×4 IMPLANT
KIT BASIN OR (CUSTOM PROCEDURE TRAY) ×2 IMPLANT
LUBRICANT JELLY K Y 4OZ (MISCELLANEOUS) ×1 IMPLANT
NDL HYPO 25X1 1.5 SAFETY (NEEDLE) IMPLANT
NEEDLE HYPO 25X1 1.5 SAFETY (NEEDLE) ×2 IMPLANT
PACK LITHOTOMY IV (CUSTOM PROCEDURE TRAY) ×2 IMPLANT
PAD ABD 8X10 STRL (GAUZE/BANDAGES/DRESSINGS) ×1 IMPLANT
PAD DRESSING TELFA 3X8 NADH (GAUZE/BANDAGES/DRESSINGS) IMPLANT
PENCIL BUTTON HOLSTER BLD 10FT (ELECTRODE) ×2 IMPLANT
SOL PREP PROV IODINE SCRUB 4OZ (MISCELLANEOUS) ×2 IMPLANT
SPONGE GAUZE 4X4 12PLY (GAUZE/BANDAGES/DRESSINGS) ×2 IMPLANT
SWAB COLLECTION DEVICE MRSA (MISCELLANEOUS) ×1 IMPLANT
SYR CONTROL 10ML LL (SYRINGE) ×1 IMPLANT
TOWEL OR 17X26 10 PK STRL BLUE (TOWEL DISPOSABLE) ×2 IMPLANT
UNDERPAD 30X30 INCONTINENT (UNDERPADS AND DIAPERS) ×2 IMPLANT
YANKAUER SUCT BULB TIP 10FT TU (MISCELLANEOUS) ×2 IMPLANT

## 2013-12-27 NOTE — ED Notes (Signed)
Pt c/o rectal abscess that has been there for 2 weeks but gotten worse since Sunday.  Pt already spoken with surgeon's office bc they reviewed his CT scan and was told to come to ED. Will with surgery has been paged that pt has arrived to ED.

## 2013-12-27 NOTE — Telephone Encounter (Signed)
Per [facility],  Patient needs to go to [facility] ED to have perianal abscess taken care of per Dr. Johney Maine. Patient notified.

## 2013-12-27 NOTE — H&P (Signed)
Todd Elliott is an 58 y.o. male.   GI:   Todd Elliott PCP:  Todd Elliott Chief Complaint: rectal pain  HPI: Pt presents with some rectal discomfort 2 weeks ago and treated it like hemorrhoids and it improved.  He awoke on 12/24/13 at [facility] with severe pain in the left rectal area.  He was seen in the ED that day with this pain, hemoccult was positive, he said it fel like a bruise.  Rectal exam was essentially negative at that time aside from some healed hemorrhoid.  He was diagnosed with spasm or tenesmus.  He was seen yesterday by GI with significant ongoing pain and what was thought to be a perirectal abscess.  CT scan showed: LEFT perianal region into the LEFT buttock medially, collection measuring 7.4 x 2.1 x 1.3 cm in size, most likely representing a perianal abscess.  He was sent to the ED for [place] to see today.  The perirectal site is much smaller on exam this exam is much less tender than yesterday and he does not feel the "golf ball," sized area that was there before.  Pain is at least 50% better than yesterday.  We are seeing in the ED.  Past Medical History  Diagnosis Date  . Hemorrhoid   . Thyroid disease Hx of tobacco use on and off     Past Surgical History  Procedure Laterality Date  . Arm surgery Right   . Appendectomy    . Abdominal surgery    . Gastric resection    . Hernia repair Right   . Cholecystectomy      Family History  Problem Relation Age of Onset  . Colon cancer Father 74  . Diabetes Maternal Aunt     x2  . Diabetes Maternal Uncle   . Diabetes      Mat great GM  . Heart disease Maternal Grandfather    Social History:  reports that he has been smoking Cigarettes.  He has been smoking about 0.00 packs per day for the past 35 years. He has never used smokeless tobacco. He reports that he drinks alcohol. He reports that he does not use illicit drugs.  Allergies: No Known Allergies   (Not in a hospital admission)  Results for orders placed in visit on  12/26/13 (from the past 48 hour(s))  CBC     Status: Abnormal   Collection Time    12/26/13  3:11 PM      Result Value Ref Range   WBC 12.7 (*) 4.0 - 10.5 K/uL   RBC 5.17  4.22 - 5.81 Mil/uL   Platelets 305.0  150.0 - 400.0 K/uL   Hemoglobin 15.5  13.0 - 17.0 g/dL   HCT 45.9  39.0 - 52.0 %   MCV 88.8  78.0 - 100.0 fl   MCHC 33.8  30.0 - 36.0 g/dL   RDW 12.7  11.5 - 00.9 %  BASIC METABOLIC PANEL     Status: None   Collection Time    12/26/13  3:11 PM      Result Value Ref Range   Sodium 140  135 - 145 mEq/L   Potassium 3.9  3.5 - 5.1 mEq/L   Chloride 104  96 - 112 mEq/L   CO2 29  19 - 32 mEq/L   Glucose, Bld 92  70 - 99 mg/dL   BUN 17  6 - 23 mg/dL   Creatinine, Ser 1.1  0.4 - 1.5 mg/dL   Calcium 9.4  8.4 - 10.5 mg/dL   GFR 73.94  >60.00 mL/min   Ct Pelvis W Contrast  12/26/2013   CLINICAL DATA:  Rectal pain, lower pelvic pain  EXAM: CT PELVIS WITH CONTRAST  TECHNIQUE: Multidetector CT imaging of the pelvis was performed using the standard protocol following the bolus administration of intravenous contrast. Sagittal and coronal MPR images reconstructed from axial data set.  CONTRAST:  73mL OMNIPAQUE IOHEXOL 300 MG/ML SOLN IV. Dilute oral contrast.  COMPARISON:  02/06/2009  FINDINGS: Ileocolic anastomosis at upper RIGHT pelvis.  Diverticulosis of sigmoid colon.  Scattered atherosclerotic calcifications.  Remaining visualized bowel loops normal appearance.  Minimal prostatic enlargement.  Rectal and anal wall thickening.  Scattered infiltrative changes are seen in perirectal fat extending into presacral and perianal region.  An elongated fluid collection with an enhancing margin is seen extending from the LEFT perianal region into the LEFT buttock medially, collection measuring 7.4 x 2.1 x 1.3 cm in size, most likely representing a perianal abscess.  No abnormal soft tissue gas identified.  Few small soft tissue nodules are seen within perirectal fat, could represent reactive lymph nodes  No  intrapelvic free fluid or additional fluid collections.  Osseous structures unremarkable.  IMPRESSION: Wall thickening of the rectum and anus with perirectal and perianal inflammatory changes compatible with infection/inflammatory process.  Additionally, elongated fluid collection identified LEFT perianal extending into the LEFT buttock medially compatible with LEFT perianal abscess.   Electronically Signed   By: Lavonia Dana M.D.   On: 12/26/2013 17:00    Review of Systems  Constitutional: Negative.   HENT: Negative.   Eyes: Negative.   Respiratory: Negative.   Cardiovascular: Negative.   Gastrointestinal: Positive for vomiting. Negative for diarrhea, constipation, blood in stool and melena.       He  Michela Pitcher he had some rectal pain 2 weeks ago and it got better with hemorrhoid suppository and warm baths.  He Awoke on 5/11 with 10/10 pain.  It became progressively worse.   He was seen in the ED and reported to have spasm or tenesmus.  Lidocaine jelly did not help.   Pt said he has had thin stools like my "pen," for 3-4 months.    Genitourinary: Negative.   Musculoskeletal: Positive for back pain.       He has DJD of the lower back  Skin: Negative.   Neurological: Negative.   Endo/Heme/Allergies: Negative.   Psychiatric/Behavioral: Negative.     Blood pressure 144/92, pulse 83, temperature 97.9 F (36.6 C), temperature source Oral, resp. rate 19, SpO2 97.00%. Physical Exam  Constitutional: He appears well-developed and well-nourished. No distress.  HENT:  Head: Normocephalic and atraumatic.  Nose: Nose normal.  Eyes: Conjunctivae and EOM are normal. Pupils are equal, round, and reactive to light.  Neck: Normal range of motion. Neck supple. No JVD present. No tracheal deviation present. No thyromegaly present.  Cardiovascular: Normal rate, regular rhythm, normal heart sounds and intact distal pulses.  Exam reveals no gallop.   No murmur heard. Respiratory: Effort normal. No respiratory  distress. He has no wheezes. He has no rales. He exhibits no tenderness.  GI: Soft. Bowel sounds are normal. He exhibits no distension and no mass. There is no tenderness. There is no rebound and no guarding.  He cannot lie on his back so this is not a good abdominal exam. Mid line incisional scar.  Genitourinary:  He has some tenderness on the left, but i cannot see any abscess.  He says he  felt something yesterday coming from the left rectal area, but it is not present by my exam or his.  He is tender left buttocks, around the rectum.  He is not tender on the right.  He has an area to the left of the rectum that is very tender and Dr. Excell Seltzer appreciates a mass there.  Lymphadenopathy:    He has no cervical adenopathy.  Skin: He is not diaphoretic.     Assessment/Plan 1.  Perirectal abscess 2.  Thyroid disease 3.  Hx of tobacco use 4.  Hemorrhoids   Plan:  He is going to the OR for I&D.  Dr. Excell Seltzer has seen and discussed procedure risk and benefits.  Earnstine Regal 12/27/2013, 11:44 AM

## 2013-12-27 NOTE — ED Notes (Signed)
Patient transported to surgery. 

## 2013-12-27 NOTE — Anesthesia Preprocedure Evaluation (Addendum)
Anesthesia Evaluation  Patient identified by MRN, date of birth, ID band Patient awake    Reviewed: Allergy & Precautions, H&P , NPO status , Patient's Chart, lab work & pertinent test results  Airway Mallampati: II TM Distance: >3 FB Neck ROM: Full    Dental no notable dental hx.    Pulmonary neg pulmonary ROS, Current Smoker,  breath sounds clear to auscultation  Pulmonary exam normal       Cardiovascular negative cardio ROS  Rhythm:Regular Rate:Normal     Neuro/Psych negative neurological ROS  negative psych ROS   GI/Hepatic negative GI ROS, Neg liver ROS,   Endo/Other  negative endocrine ROS  Renal/GU negative Renal ROS  negative genitourinary   Musculoskeletal negative musculoskeletal ROS (+)   Abdominal   Peds negative pediatric ROS (+)  Hematology negative hematology ROS (+)   Anesthesia Other Findings   Reproductive/Obstetrics negative OB ROS                          Anesthesia Physical Anesthesia Plan  ASA: II  Anesthesia Plan: General   Post-op Pain Management:    Induction: Intravenous, Rapid sequence and Cricoid pressure planned  Airway Management Planned: Oral ETT  Additional Equipment:   Intra-op Plan:   Post-operative Plan: Extubation in OR  Informed Consent: I have reviewed the patients History and Physical, chart, labs and discussed the procedure including the risks, benefits and alternatives for the proposed anesthesia with the patient or authorized representative who has indicated his/her understanding and acceptance.   Dental advisory given  Plan Discussed with: CRNA  Anesthesia Plan Comments:         Anesthesia Quick Evaluation

## 2013-12-27 NOTE — ED Provider Notes (Signed)
Medical screening examination/treatment/procedure(s) were performed by non-physician practitioner and as supervising physician I was immediately available for consultation/collaboration.   Houston Siren III, MD 12/27/13 1500

## 2013-12-27 NOTE — H&P (Signed)
Patient interviewed and examined, imaging personally reviewed, agree with PA note above. He has a somewhat subtle but definite tender left perianal mass and imaging consistent with an abscess.  I discussed options of management and risks with the patient and we have elected to proceed with I&D  Edward Jolly MD, FACS  12/27/2013 1:16 PM

## 2013-12-27 NOTE — ED Provider Notes (Signed)
Patient is a 58 year old male who presents to ED after direction from Dr. Johney Maine for perirectal abscess drainage by surgery service. Patient is in no acute distress. He reports an uncomfortable feeling in his rectum. Patient was offered pain medications which he declined. He is afebrile and hemodynamically stable. Will Creig Hines, PA-C is at bedside currently. He will order appropriate labs and prep patient for OR.   Filed Vitals:   12/27/13 1100  BP: 144/92  Pulse: 83  Temp: 97.9 F (36.6 C)  Resp: [address] [place] 12/27/13 1126

## 2013-12-27 NOTE — Op Note (Signed)
Preoperative Diagnosis: perirectal abcess  Postoprative Diagnosis: #1 same     #2 rectal mass  Procedure: Procedure(s):  #1 incision and drainage of left perirectal abscess  #2 rigid sigmoidoscopy with biopsy of rectal mass    Surgeon: Excell Seltzer T   Assistants: none  Anesthesia:  General endotracheal anesthesia  Indications: patient is a 58 year old male who presents with several days of worsening left perirectal pain. He has had a palpable mass in the perineum just to the left of the anus which has waxed and waned to some in size, noticed by his caregivers and him. He underwent CT scan yesterday showing some inflammatory change in the left perirectal space and then a fluid collection in the left perianal space consistent with abscess. He has a mildly elevated white count and a somewhat indistinct palpable and very tender mass in the left perirectal space on exam of his perineum. With this constellation of findings I recommended proceeding with incision and drainage of apparent left perirectal abscess. I discussed the nature of the surgery and risks of anesthetic complications, bleeding, infection and fistula formation. He is now brought to the operating room for this procedure.    Procedure Detail:  Patient was brought to the operating room, placed in the supine position on the operating table, and general endotracheal anesthesia induced. He was carefully positioned in lithotomy position and the perineum sterilely prepped and draped. He received IV antibiotics preoperatively. Patient was performed and correct procedure verified. Initially I performed a digital rectal exam and with the patient relaxed in lithotomy position at the tip of my finger I was able to feel a polypoid somewhat firm based mass in the left lateral rectal wall. There was also noted to be an area of induration or mass in the perirectal space on the left. I performed a rigid sigmoidoscopy this point to 15 cm and at 7-8  cm from the anal verge in the left lateral rectum was an approximately 2 cm exophytic polypoid somewhat friable mass that seemed to have a firm base. 2 generous biopsies were obtained with biopsy forceps from the mass near the base. This was definitely several centimeters above the perianal induration and there was normal rectal wall palpable between the perirectal process and the mass and I could not detect that they were connected all. I then made an incision in the perianal space a couple centimeters from the anal verge over the indurated area and entered a moderate sized abscess cavity and drained about 10 cc of purulent material. This was cultured. The cavity was several centimeters in diameter all loculations were broken up. This was packed with iodoform gauze. Dry gauze dressings were applied. Sponge needle and instrument counts were correct.    Findings: #1 left perianal abscess    #2 at 7 cm to 8 cm on the left lateral rectal wall a 2 cm friable mass, biopsies taken  Estimated Blood Loss:  Minimal         Drains: abscess cavity packed with iodoform gauze  Blood Given: none          Specimens: #1 culture and sensitivity left perirectal abscess  #2 biopsy x2 of left rectal mass        Complications:  * No complications entered in OR log *         Disposition: PACU - hemodynamically stable.         Condition: stable

## 2013-12-27 NOTE — ED Notes (Signed)
Patient states that he has not been informed of risks of procedure, states that surgical PA left to look over his scans and states he believes surgical PA plans to talk to him further.

## 2013-12-27 NOTE — ED Notes (Signed)
Surgical PA at bedside  

## 2013-12-27 NOTE — Transfer of Care (Signed)
Immediate Anesthesia Transfer of Care Note  Patient: Todd Elliott  Procedure(s) Performed: Procedure(s): IRRIGATION AND DEBRIDEMENT PERIRECTAL ABSCESS (Left)  Patient Location: PACU  Anesthesia Type:General  Level of Consciousness: awake, alert  and oriented  Airway & Oxygen Therapy: Patient Spontanous Breathing and Patient connected to face mask oxygen  Post-op Assessment: Report given to PACU RN and Post -op Vital signs reviewed and stable  Post vital signs: Reviewed and stable  Complications: No apparent anesthesia complications

## 2013-12-27 NOTE — Telephone Encounter (Signed)
Called to notify them at [facility] that this pt really needs to go to [facility] to be evaluated for surgery for his perianal abscess. The abscess is measuring over 7cm with a white count of 12.7 this is just too large for [place] to be able to do in the office. I want them to call the pt to explain the reason for sending him to [facility]. I will call our P.A. Will that is over at [facility] to give him a heads up on the pt coming to [facility].  I paged Will gave him the information of the pt and I called the charge nurse Stacy @[facility]  ER for them to page Will once the pt arrives.

## 2013-12-27 NOTE — Anesthesia Postprocedure Evaluation (Signed)
  Anesthesia Post-op Note  Patient: Todd Elliott  Procedure(s) Performed: Procedure(s) (LRB): IRRIGATION AND DEBRIDEMENT PERIRECTAL ABSCESS (Left)  Patient Location: PACU  Anesthesia Type: General  Level of Consciousness: awake and alert   Airway and Oxygen Therapy: Patient Spontanous Breathing  Post-op Pain: mild  Post-op Assessment: Post-op Vital signs reviewed, Patient's Cardiovascular Status Stable, Respiratory Function Stable, Patent Airway and No signs of Nausea or vomiting  Last Vitals:  Filed Vitals:   12/27/13 1432  BP: 141/80  Pulse: 81  Temp: 36.9 C  Resp: 18    Post-op Vital Signs: stable   Complications: No apparent anesthesia complications

## 2013-12-28 ENCOUNTER — Telehealth (INDEPENDENT_AMBULATORY_CARE_PROVIDER_SITE_OTHER): Payer: Self-pay | Admitting: General Surgery

## 2013-12-28 ENCOUNTER — Encounter (HOSPITAL_COMMUNITY): Payer: Self-pay | Admitting: General Surgery

## 2013-12-28 LAB — BASIC METABOLIC PANEL
BUN: 14 mg/dL (ref 6–23)
CO2: 28 mEq/L (ref 19–32)
Calcium: 9.2 mg/dL (ref 8.4–10.5)
Chloride: 105 mEq/L (ref 96–112)
Creatinine, Ser: 1.07 mg/dL (ref 0.50–1.35)
GFR calc Af Amer: 87 mL/min — ABNORMAL LOW (ref >90)
GFR, EST NON AFRICAN AMERICAN: 75 mL/min — AB (ref >90)
Glucose, Bld: 101 mg/dL — ABNORMAL HIGH (ref 70–99)
POTASSIUM: 4 meq/L (ref 3.7–5.3)
SODIUM: 142 meq/L (ref 137–147)

## 2013-12-28 LAB — CBC
HCT: 42.4 % (ref 39.0–52.0)
HEMOGLOBIN: 13.7 g/dL (ref 13.0–17.0)
MCH: 29.3 pg (ref 26.0–34.0)
MCHC: 32.3 g/dL (ref 30.0–36.0)
MCV: 90.8 fL (ref 78.0–100.0)
PLATELETS: 246 10*3/uL (ref 150–400)
RBC: 4.67 MIL/uL (ref 4.22–5.81)
RDW: 12.7 % (ref 11.5–15.5)
WBC: 11.4 10*3/uL — ABNORMAL HIGH (ref 4.0–10.5)

## 2013-12-28 MED ORDER — OXYCODONE HCL 5 MG PO TABS: 5.0000 mg | ORAL_TABLET | ORAL | Status: DC | PRN

## 2013-12-28 MED ORDER — AMOXICILLIN-POT CLAVULANATE 875-125 MG PO TABS: 1.0000 | ORAL_TABLET | Freq: Two times a day (BID) | ORAL | Status: DC

## 2013-12-28 MED ORDER — POLYETHYLENE GLYCOL 3350 17 G PO PACK
17.0000 g | PACK | Freq: Every day | ORAL | Status: DC
  Administered 2013-12-28: 17 g via ORAL
  Filled 2013-12-28: qty 1

## 2013-12-28 NOTE — Telephone Encounter (Signed)
Called the patient and discussed the pathology

## 2013-12-28 NOTE — Progress Notes (Signed)
Discharge instructions reviewed with patient and spouse, questions answered, verbalized understanding.  Patient given written prescription for antibiotic and pain medication.  Patient transported to front of hospital via wheelchair to be taken home by wife.  Patient in good condition at time of discharge from [facility].

## 2013-12-28 NOTE — Discharge Summary (Signed)
Patient ID: HAMMAD FINKLER MRN: [id number] DOB/AGE: [birth year] 58 y.o.  Admit date: 12/27/2013 Discharge date: 12/28/2013  Procedures: incision and drainage of perirectal abscess Rigid sigmoidoscopy with biopsy of rectal mass  Consults: None  Reason for Admission: Pt presents with some rectal discomfort 2 weeks ago and treated it like hemorrhoids and it improved. He awoke on 12/24/13 at [facility] with severe pain in the left rectal area. He was seen in the ED that day with this pain, hemoccult was positive, he said it fel like a bruise. Rectal exam was essentially negative at that time aside from some healed hemorrhoid. He was diagnosed with spasm or tenesmus. He was seen yesterday by GI with significant ongoing pain and what was thought to be a perirectal abscess. CT scan showed: LEFT perianal region into the LEFT buttock medially, collection measuring 7.4 x 2.1 x 1.3 cm in size, most likely representing a perianal abscess.  He was sent to the ED for [place] to see today. The perirectal site is much smaller on exam this exam is much less tender than yesterday and he does not feel the "golf ball," sized area that was there before. Pain is at least 50% better than yesterday. We are seeing in the ED.  Admission Diagnoses:  1. Perirectal abscess  2. Thyroid disease  3. Hx of tobacco use  4. Hemorrhoids   Hospital Course: the patient was admitted and taken to the OR where he was found to have a perirectal abscess that was I&D.  He was also noted to have a 2cm friable polypoid type mass about 7-8cm from his anal verge.  2 biopsies were taken of this.  The patient's packing was removed on POD 1.  He was otherwise doing well.  His wound was not repacked.  He was informed to start sitz bathes.  He was stable for dc home with abx therapy on POD 1.  Biopsies still pending.  PE: Buttock: packing removed without difficulty.  There is no erythema or further purulent drainage.  Discharge Diagnoses:  Active  Problems:   Perirectal abscess rectal mass, biopsies pending S/- I&D of perirectal abscess  Discharge Medications:   Medication List         amoxicillin-clavulanate 875-125 MG per tablet  Commonly known as:  AUGMENTIN  Take 1 tablet by mouth 2 (two) times daily.     B-6 PO  Take 1 tablet by mouth daily.     CENTRUM SILVER PO  Take 1 tablet by mouth daily.     levothyroxine 175 MCG tablet  Commonly known as:  SYNTHROID, LEVOTHROID  Take 175 mcg by mouth daily before breakfast.     MOVIPREP 100 G Solr  Generic drug:  peg 3350 powder  Take 1 kit (200 g total) by mouth once.     oxyCODONE 5 MG immediate release tablet  Commonly known as:  Oxy IR/ROXICODONE  Take 1-2 tablets (5-10 mg total) by mouth every 4 (four) hours as needed for moderate pain.     traMADol 50 MG tablet  Commonly known as:  ULTRAM  Take 1 tablet (50 mg total) by mouth every 6 (six) hours as needed.        Discharge Instructions:     Follow-up Information   Follow up with HOXWORTH,BENJAMIN T, MD. Schedule an appointment as soon as possible for a visit in 2 weeks.   Specialty:  General Surgery   Contact information:   [address] [place] [address]  [phone]       Follow up with Malcolm T. Stark, MD. Schedule an appointment as soon as possible for a visit in 2 weeks.   Specialty:  Gastroenterology   Contact information:   [address] [place] [address] [phone]       Signed: Kelly E Osborne 12/28/2013, 8:50 AM  

## 2013-12-28 NOTE — Discharge Summary (Signed)
Patient interviewed and examined. Agree with assessment and discharge plan as outlined by Ms. Osborne. All questions answered. To follow up with Dr. Excell Seltzer.  Edsel Petrin. Dalbert Batman, M.D., [facility] General and Minimally invasive Surgery Breast and Colorectal Surgery Office:   [phone] Pager:   [phone]

## 2013-12-28 NOTE — Discharge Instructions (Signed)
Sitz Bath 2-3 times a day and after each BM A sitz bath is a warm water bath taken in the sitting position that covers only the hips and buttocks. It may be used for either healing or hygiene purposes. Sitz baths are also used to relieve pain, itching, or muscle spasms. The water may contain medicine. Moist heat will help you heal and relax.  HOME CARE INSTRUCTIONS  Take 3 to 4 sitz baths a day. 1. Fill the bathtub half full with warm water. 2. Sit in the water and open the drain a little. 3. Turn on the warm water to keep the tub half full. Keep the water running constantly. 4. Soak in the water for 15 to 20 minutes. 5. After the sitz bath, pat the affected area dry first. SEEK MEDICAL CARE IF:  You get worse instead of better. Stop the sitz baths if you get worse. MAKE SURE YOU:  Understand these instructions.  Will watch your condition.  Will get help right away if you are not doing well or get worse. Document Released: 04/24/2004 Document Revised: 04/26/2012 Document Reviewed: 10/30/2010 [facility] Patient Information 2014 [place], [place].

## 2013-12-30 LAB — CULTURE, ROUTINE-ABSCESS

## 2013-12-31 ENCOUNTER — Telehealth (INDEPENDENT_AMBULATORY_CARE_PROVIDER_SITE_OTHER): Payer: Self-pay

## 2013-12-31 NOTE — Telephone Encounter (Signed)
Message copied by Ivor Costa on Mon Dec 31, 2013  2:29 PM ------      Message from: [facility]      Created: Mon Dec 31, 2013 10:42 AM      Contact: [phone]       HE HAD SURGERY ON 05/14 AND NEEDS A PO APPT ------

## 2013-12-31 NOTE — ED Provider Notes (Signed)
Medical screening examination/treatment/procedure(s) were performed by non-physician practitioner and as supervising physician I was immediately available for consultation/collaboration.   EKG Interpretation   Date/Time:  Thursday Dec 27 2013 11:53:51 EDT Ventricular Rate:  70 PR Interval:  112 QRS Duration: 88 QT Interval:  380 QTC Calculation: 410 R Axis:   48 Text Interpretation:  Normal sinus rhythm EKG WITHIN NORMAL LIMITS  Confirmed by Tawnya Crook  MD, MEGAN [phone]) on 12/27/2013 12:01:03 PM       Virgel Manifold, MD 12/31/13 [phone]

## 2013-12-31 NOTE — Telephone Encounter (Signed)
Called and spoke to patient with post op appointment date and time on 01/10/14 @ 11:30 am w/Dr. Excell Seltzer.

## 2014-01-03 LAB — ANAEROBIC CULTURE: Gram Stain: NONE SEEN

## 2014-01-10 ENCOUNTER — Telehealth (INDEPENDENT_AMBULATORY_CARE_PROVIDER_SITE_OTHER): Payer: Self-pay | Admitting: General Surgery

## 2014-01-10 ENCOUNTER — Ambulatory Visit (INDEPENDENT_AMBULATORY_CARE_PROVIDER_SITE_OTHER): Payer: BC Managed Care – PPO | Admitting: General Surgery

## 2014-01-10 VITALS — BP 142/86 | HR 71 | Temp 98.4°F | Resp 16

## 2014-01-10 DIAGNOSIS — K612 Anorectal abscess: Secondary | ICD-10-CM

## 2014-01-10 DIAGNOSIS — K611 Rectal abscess: Secondary | ICD-10-CM

## 2014-01-10 MED ORDER — AMOXICILLIN-POT CLAVULANATE 875-125 MG PO TABS: 1.0000 | ORAL_TABLET | Freq: Two times a day (BID) | ORAL | Status: DC

## 2014-01-10 NOTE — Progress Notes (Signed)
Chief complaint: Followup perirectal abscess and rectal mass  History: Patient returns 2 weeks following incision and drainage of left perirectal abscess. He presented with intermittent rectal pain for couple of weeks. Exam was subtle but he did have some induration in the left perirectal space. At the time of surgery I did drain about 10 cc of per material from a fairly deep left perirectal abscess. Also on sigmoidoscopy he had about a 2 cm indurated exophytic mass in the left lateral rectal wall at 7-8 cm which appeared suspicious for neoplasm. Biopsies were however benign. He had immediate relief following surgery but over the last couple of weeks has had some intermittent discomfort and swelling again. It is bothering him again today. It is not nearly as severe as it was preoperatively. He has a colonoscopy scheduled by Dr. Fuller Plan on June 10 which was scheduled before all this started.  Exam: BP 142/86  Pulse 71  Temp(Src) 98.4 F (36.9 C) (Temporal)  Resp 16 General: Appears well Rectal: There is some mild induration in the left perirectal space with possibly somewhat of a fistulous tract extending subcutaneously but no external opening or drainage. He is moderately tender here.  Assessment and plan: Unusual perirectal abscess and rectal mass that grossly was concern for tumor but negative biopsy. He obviously has some ongoing inflammatory issues as well. I think an MRI might help define this process will discuss this with Dr. Marcello Moores and ask her to see him in consultation. Continue with colonoscopy on June 10 and I'll be sure Dr. Fuller Plan is aware of the operative findings of this can be investigated and probably further biopsies obtained. Would even consider rectal ultrasound at that time.

## 2014-01-10 NOTE — Telephone Encounter (Signed)
Patient called in and explained that he had been seen earlier today and did not ask for a refill of his pain medication.  He is now experiencing sharp pain and would like a refill.  Informed him that I would send this message to Dr. Excell Seltzer, but per protocol we have to give him 24 hrs to respond.  Informed him to call out office tomorrow if he still had not heard a response and then we could take the refill request to our urgent office Dr. Informed him to use ibuprofen and warm sitz baths in the meantime. Patient verbalized understanding.

## 2014-01-10 NOTE — Patient Instructions (Signed)
I will call you tomorrow with further recommendations. If you do not hear from [place] by tomorrow afternoon please call to inquire.

## 2014-01-11 ENCOUNTER — Encounter: Payer: Self-pay | Admitting: Gastroenterology

## 2014-01-14 ENCOUNTER — Telehealth (INDEPENDENT_AMBULATORY_CARE_PROVIDER_SITE_OTHER): Payer: Self-pay

## 2014-01-14 ENCOUNTER — Telehealth: Payer: Self-pay | Admitting: Gastroenterology

## 2014-01-14 NOTE — Telephone Encounter (Signed)
Patient is rescheduled to Friday 01/18/14.  I have reviewed all instructions with him.  He verbalized understanding of the instructions.

## 2014-01-14 NOTE — Telephone Encounter (Signed)
Pt states that he has had continuous rectal pain and he wanted to see if there was anyway that Dr Excell Seltzer could have Dr. Fuller Plan move his colonoscopy up, it is currently scheduled for June 10. Informed pt that we would speak with Dr Excell Seltzer regarding this and we could get back in touch with him, Pt verbalized understanding and agrees with POC.

## 2014-01-14 NOTE — Telephone Encounter (Signed)
The patient called back and I wanted Dr. Excell Seltzer to moves his apt up with Dr Fuller Plan and I saw where Dr Excell Seltzer sent an message back and I read to the patient what Dr. Excell Seltzer stated that he needed that he will need to call Dr. Fuller Plan office himself to move his apt up. Or if he getting worse he needs to be seen in the office or ER and may require repeat drainage

## 2014-01-14 NOTE — Telephone Encounter (Signed)
I cannot change Dr Lynne Leader schedule, he could call Dr Lynne Leader office and check. If his pain is getting worse he needs to be seen in office or ER and may require repeat drainage

## 2014-01-15 NOTE — Telephone Encounter (Signed)
Appointment with Dr. Fuller Plan has been moved to earlier date (01/18/14)

## 2014-01-18 ENCOUNTER — Encounter: Payer: Self-pay | Admitting: Gastroenterology

## 2014-01-18 ENCOUNTER — Other Ambulatory Visit: Payer: Self-pay

## 2014-01-18 ENCOUNTER — Ambulatory Visit (AMBULATORY_SURGERY_CENTER): Payer: BC Managed Care – PPO | Admitting: Gastroenterology

## 2014-01-18 VITALS — BP 122/68 | HR 74 | Temp 98.7°F | Resp 24 | Ht 70.0 in | Wt 215.0 lb

## 2014-01-18 DIAGNOSIS — D126 Benign neoplasm of colon, unspecified: Secondary | ICD-10-CM

## 2014-01-18 DIAGNOSIS — K6289 Other specified diseases of anus and rectum: Secondary | ICD-10-CM

## 2014-01-18 DIAGNOSIS — Z86010 Personal history of colonic polyps: Secondary | ICD-10-CM

## 2014-01-18 DIAGNOSIS — R933 Abnormal findings on diagnostic imaging of other parts of digestive tract: Secondary | ICD-10-CM

## 2014-01-18 MED ORDER — TRAMADOL HCL 50 MG PO TABS: 50.0000 mg | ORAL_TABLET | Freq: Four times a day (QID) | ORAL | Status: DC | PRN

## 2014-01-18 MED ORDER — SODIUM CHLORIDE 0.9 % IV SOLN: 500.0000 mL | INTRAVENOUS | Status: DC

## 2014-01-18 NOTE — Op Note (Addendum)
[facility]  [address] [place], [address]   COLONOSCOPY PROCEDURE REPORT PATIENT: Todd Elliott, Todd Elliott  MR#: [id number] BIRTHDATE: April 28, 1956 , 57  yrs. old GENDER: Male ENDOSCOPIST: Ladene Artist, MD, [facility] PROCEDURE DATE:  01/18/2014 PROCEDURE:   Colonoscopy with biopsy and snare polypectomy First Screening Colonoscopy - Avg.  risk and is 50 yrs.  old or older - No.  Prior Negative Screening - Now for repeat screening. N/A  History of Adenoma - Now for follow-up colonoscopy & has been > or = to 3 yrs.  Yes hx of adenoma.  Has been 3 or more years since last colonoscopy.  Polyps Removed Today? Yes. ASA CLASS:   Class II INDICATIONS:Patient's personal history of adenomatous colon polyps and an abnormal CT. MEDICATIONS: MAC sedation, administered by CRNA and propofol (Diprivan) 400mg  IV DESCRIPTION OF PROCEDURE:   After the risks benefits and alternatives of the procedure were thoroughly explained, informed consent was obtained.  A digital rectal exam revealed a palpable rectal mass.   The LB [id number]  endoscope was introduced through the anus and advanced to the terminal ileum which was intubated for a short distance. No adverse events experienced. The quality of the prep was adequate after extensive rinsing and suctioning, using MoviPrep  The instrument was then slowly withdrawn as the colon was fully examined.  COLON FINDINGS: Mild ileitis, erythema, was found in the neoterminal ileum.  Multiple biopsies of the area were performed.   There was evidence of a prior ileocolonic surgical anastomosis in the ascending colon.  Two sessile polyps measuring 1 cm in size were found in the ascending colon.  A polypectomy was performed using snare cautery.  The resection was complete and the polyp tissue was completely retrieved.   A sessile polyp measuring 5 mm in size was found in the descending colon.  A polypectomy was performed with a cold snare.  The  resection was complete and the polyp tissue was completely retrieved. Mild diverticulosis was noted in the descending colon and sigmoid colon.   A one-third circumferential irregular mass, measuring 3 X 2 cm in size, with friable surfaces was found in the rectum. 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  Multiple biopsies of the lesion were performed using cold forceps and a hot snare.  Small internal hemorrhoids.  The colon was otherwise normal.  There was no diverticulosis, inflammation, polyps or cancers unless previously stated. Retroflexion was not performed due to a narrow rectal vault. The time to cecum=2 minutes 05 seconds.  Withdrawal time=21 minutes 39 seconds.  The scope was withdrawn and the procedure completed.  COMPLICATIONS: There were no complications.  ENDOSCOPIC IMPRESSION: 1.   Mild ileitis in the neoterminal ileum; multiple biopsies performed 2.   Prior ileocolonic surgical anastomosis in the ascending colon 3.   Two sessile polyps measuring 1 cm in the ascending colon; polypectomy performed using snare cautery 4.   Sessile polyp measuring 5 mm in the descending colon; polypectomy performed with a cold snare 5.   Mild diverticulosis in the descending colon and sigmoid colon 6.   One-third circumferential rectal mass, measuring 3 X 2cm in size and a deformed rectal vault; multiple biopsies performed 7.   Small internal hemorrhoids     RECOMMENDATIONS: 1.  Await pathology results 2.  Hold aspirin, aspirin products, and anti-inflammatory medication for 2 weeks. 3.  High fiber diet with liberal fluid intake. 4.  Repeat Colonoscopy in 3 years. 5.  Schedule abd/pelvic CT  eSigned:  Ladene Artist, MD, [facility] 01/18/2014 11:20 AM    cc: Excell Seltzer, MD   PATIENT NAME:  Reedy, Biernat MR#: [id number]

## 2014-01-18 NOTE — Patient Instructions (Addendum)
YOU HAD AN ENDOSCOPIC PROCEDURE TODAY AT [facility]: Refer to the procedure report that was given to you for any specific questions about what was found during the examination.  If the procedure report does not answer your questions, please call your gastroenterologist to clarify.  If you requested that your care partner not be given the details of your procedure findings, then the procedure report has been included in a sealed envelope for you to review at your convenience later.  YOU SHOULD EXPECT: Some feelings of bloating in the abdomen. Passage of more gas than usual.  Walking can help get rid of the air that was put into your GI tract during the procedure and reduce the bloating. If you had a lower endoscopy (such as a colonoscopy or flexible sigmoidoscopy) you may notice spotting of blood in your stool or on the toilet paper. If you underwent a bowel prep for your procedure, then you may not have a normal bowel movement for a few days.  DIET: Your first meal following the procedure should be a light meal and then it is ok to progress to your normal diet.  A half-sandwich or bowl of soup is an example of a good first meal.  Heavy or fried foods are harder to digest and may make you feel nauseous or bloated.  Likewise meals heavy in dairy and vegetables can cause extra gas to form and this can also increase the bloating.  Drink plenty of fluids but you should avoid alcoholic beverages for 24 hours.  ACTIVITY: Your care partner should take you home directly after the procedure.  You should plan to take it easy, moving slowly for the rest of the day.  You can resume normal activity the day after the procedure however you should NOT DRIVE or use heavy machinery for 24 hours (because of the sedation medicines used during the test).    SYMPTOMS TO REPORT IMMEDIATELY: A gastroenterologist can be reached at any hour.  During normal business hours, 8:30 AM to 5:00 PM Monday through Friday,  call [phone].  After hours and on weekends, please call the GI answering service at [phone] who will take a message and have the physician on call contact you.   Following lower endoscopy (colonoscopy or flexible sigmoidoscopy):  Excessive amounts of blood in the stool  Significant tenderness or worsening of abdominal pains  Swelling of the abdomen that is new, acute  Fever of 100F or higher   FOLLOW UP: If any biopsies were taken you will be contacted by phone or by letter within the next 1-3 weeks.  Call your gastroenterologist if you have not heard about the biopsies in 3 weeks.  Our staff will call the home number listed on your records the next business day following your procedure to check on you and address any questions or concerns that you may have at that time regarding the information given to you following your procedure. This is a courtesy call and so if there is no answer at the home number and we have not heard from you through the emergency physician on call, we will assume that you have returned to your regular daily activities without incident.  SIGNATURES/CONFIDENTIALITY: You and/or your care partner have signed paperwork which will be entered into your electronic medical record.  These signatures attest to the fact that that the information above on your After Visit Summary has been reviewed and is understood.  Full responsibility of the confidentiality of  this discharge information lies with you and/or your care-partner.  Recommendations Await pathology results Hold aspirin, aspirin products, and anti-inflammatory medication for 2 weeks High fiber diet with liberal fluid intake Repeat colonoscopy in 3 years with a more extensive bowel prep CT Scan scheduled for Monday, June 8th at 1:30pm. Contrast given, instructions, and driving directions on separate handout

## 2014-01-18 NOTE — Progress Notes (Signed)
You have been scheduled for a CT scan of the abdomen and pelvis at [place] [facility] ([address]---this is in the same building as Press photographer).   You are scheduled on 01/21/14 at 130 pm. You should arrive 15 minutes prior to your appointment time for registration. Please follow the written instructions below on the day of your exam:  WARNING: IF YOU ARE ALLERGIC TO IODINE/X-RAY DYE, PLEASE NOTIFY RADIOLOGY IMMEDIATELY AT [phone]! YOU WILL BE GIVEN A 13 HOUR PREMEDICATION PREP.  1) Do not eat or drink anything after 930 am (4 hours prior to your test) 2) You have been given 2 bottles of oral contrast to drink. The solution may taste better if refrigerated, but do NOT add ice or any other liquid to this solution. Shake  well before drinking.    Drink 1 bottle of contrast @ 1130 am (2 hours prior to your exam)  Drink 1 bottle of contrast @ 1230 pm (1 hour prior to your exam)  You may take any medications as prescribed with a small amount of water except for the following: Metformin, Glucophage, Glucovance, Avandamet, Riomet, Fortamet, Actoplus Met, Janumet, Glumetza or Metaglip. The above medications must be held the day of the exam AND 48 hours after the exam.  The purpose of you drinking the oral contrast is to aid in the visualization of your intestinal tract. The contrast solution may cause some diarrhea. Before your exam is started, you will be given a small amount of fluid to drink. Depending on your individual set of symptoms, you may also receive an intravenous injection of x-ray contrast/dye. Plan on being at [facility] for 30 minutes or long, depending on the type of exam you are having performed.  LE[facility] will give contrast and instructions to the pt   This test typically takes 30-45 minutes to complete.  If you have any questions regarding your exam or if you need to reschedule, you may call the CT department at [phone] between the hours of 8:00 am and  5:00 pm, Monday-Friday.  ________________________________________________________________________

## 2014-01-18 NOTE — Progress Notes (Signed)
CT scan scheduled, contrast and instructions given to patient and family member. Driving directions handout also given.

## 2014-01-18 NOTE — Progress Notes (Signed)
Procedure ends, to recovery, report given and VSS. 

## 2014-01-21 ENCOUNTER — Ambulatory Visit (INDEPENDENT_AMBULATORY_CARE_PROVIDER_SITE_OTHER)
Admission: RE | Admit: 2014-01-21 | Discharge: 2014-01-21 | Disposition: A | Discharge status: Home / Self Care | Payer: BC Managed Care – PPO | Source: Ambulatory Visit | Attending: Gastroenterology | Admitting: Gastroenterology

## 2014-01-21 ENCOUNTER — Encounter: Payer: Self-pay | Admitting: Gastroenterology

## 2014-01-21 ENCOUNTER — Telehealth (INDEPENDENT_AMBULATORY_CARE_PROVIDER_SITE_OTHER): Payer: Self-pay

## 2014-01-21 ENCOUNTER — Other Ambulatory Visit: Payer: Self-pay

## 2014-01-21 ENCOUNTER — Telehealth: Payer: Self-pay | Admitting: *Deleted

## 2014-01-21 DIAGNOSIS — K6289 Other specified diseases of anus and rectum: Secondary | ICD-10-CM

## 2014-01-21 DIAGNOSIS — R933 Abnormal findings on diagnostic imaging of other parts of digestive tract: Secondary | ICD-10-CM

## 2014-01-21 DIAGNOSIS — R198 Other specified symptoms and signs involving the digestive system and abdomen: Secondary | ICD-10-CM

## 2014-01-21 MED ORDER — IOHEXOL 300 MG/ML  SOLN
100.0000 mL | Freq: Once | INTRAMUSCULAR | Status: AC | PRN
  Administered 2014-01-21: 100 mL via INTRAVENOUS

## 2014-01-21 NOTE — Telephone Encounter (Signed)
Sheri with Dr Lynne Leader office was calling to let Dr Excell Seltzer know of the pt's colonoscopy and CT results. Pt is also scheduled to have a MRI on 01/22/14 @ 1145. Dr Fuller Plan would like to have the patient to come in to see either Dr Excell Seltzer or Dr Marcello Moores as soon as he could. Informed Sheri that I would send Dr Excell Seltzer and his nurse a message to view these results. Sheri verbalized understanding and agress with POC.

## 2014-01-21 NOTE — Telephone Encounter (Signed)
  Follow up Call-  Call back number 01/18/2014  Post procedure Call Back phone  # [phone]  Permission to leave phone message Yes     Patient questions:  Do you have a fever, pain , or abdominal swelling? no Pain Score  0 *  Have you tolerated food without any problems? yes  Have you been able to return to your normal activities? yes  Do you have any questions about your discharge instructions:  Drinking prep for CT Diet   no Medications  no Follow up visit  no  Do you have questions or concerns about your Care? no  Actions: * If pain score is 4 or above: No action needed, pain <4.

## 2014-01-22 ENCOUNTER — Ambulatory Visit: Admission: RE | Admit: 2014-01-22 | Payer: BC Managed Care – PPO | Source: Ambulatory Visit

## 2014-01-22 ENCOUNTER — Ambulatory Visit
Admission: RE | Admit: 2014-01-22 | Discharge: 2014-01-22 | Disposition: A | Discharge status: Home / Self Care | Payer: BC Managed Care – PPO | Source: Ambulatory Visit | Attending: Gastroenterology | Admitting: Gastroenterology

## 2014-01-22 ENCOUNTER — Other Ambulatory Visit: Payer: Self-pay | Admitting: Gastroenterology

## 2014-01-22 DIAGNOSIS — R933 Abnormal findings on diagnostic imaging of other parts of digestive tract: Secondary | ICD-10-CM

## 2014-01-22 DIAGNOSIS — Z139 Encounter for screening, unspecified: Secondary | ICD-10-CM

## 2014-01-22 MED ORDER — GADOBENATE DIMEGLUMINE 529 MG/ML IV SOLN
20.0000 mL | Freq: Once | INTRAVENOUS | Status: AC | PRN
  Administered 2014-01-22: 20 mL via INTRAVENOUS

## 2014-01-22 NOTE — Telephone Encounter (Signed)
Patient needs to see Dr. Marcello Moores. She is aware.

## 2014-01-23 ENCOUNTER — Ambulatory Visit (INDEPENDENT_AMBULATORY_CARE_PROVIDER_SITE_OTHER): Payer: BC Managed Care – PPO | Admitting: General Surgery

## 2014-01-23 ENCOUNTER — Encounter (INDEPENDENT_AMBULATORY_CARE_PROVIDER_SITE_OTHER): Payer: Self-pay | Admitting: General Surgery

## 2014-01-23 ENCOUNTER — Encounter (INDEPENDENT_AMBULATORY_CARE_PROVIDER_SITE_OTHER): Payer: BC Managed Care – PPO | Admitting: General Surgery

## 2014-01-23 ENCOUNTER — Encounter: Payer: BC Managed Care – PPO | Admitting: Gastroenterology

## 2014-01-23 VITALS — BP 148/96 | HR 78 | Temp 98.7°F | Resp 18 | Ht 71.0 in | Wt 213.2 lb

## 2014-01-23 DIAGNOSIS — K612 Anorectal abscess: Secondary | ICD-10-CM

## 2014-01-23 DIAGNOSIS — K611 Rectal abscess: Secondary | ICD-10-CM

## 2014-01-23 MED ORDER — CIPROFLOXACIN HCL 500 MG PO TABS: 500.0000 mg | ORAL_TABLET | Freq: Two times a day (BID) | ORAL | Status: DC

## 2014-01-23 MED ORDER — METRONIDAZOLE 500 MG PO TABS: 500.0000 mg | ORAL_TABLET | Freq: Three times a day (TID) | ORAL | Status: DC

## 2014-01-23 NOTE — Telephone Encounter (Signed)
Appointment rescheduled for today @ 2:25pm w/Dr. Marcello Moores.  Patient notified.

## 2014-01-23 NOTE — Telephone Encounter (Signed)
Called and spoke to patient regarding appointment with Dr. Marcello Moores.  Patient was given appointment for 02/06/14 w/Dr. Marcello Moores.  Patient not very happy about this due to his pain.  Patient requesting to be seen this week.  Advised patient that I would need to check schedules and see if there's any possibility to work him in any earlier.  Will call patient back to discuss appointment,

## 2014-01-23 NOTE — Patient Instructions (Signed)

## 2014-01-23 NOTE — Progress Notes (Signed)
Chief Complaint  Patient presents with  . Rectal Problems    new pt- eval rectal mass    HISTORY: Todd Elliott is a 58 y.o. male who presents to the office with rectal pain.  He states that this began several weeks ago.  He saw my partner Dr. Excell Seltzer who explored him in the operating room. An abscess was found proximally in the rectum and drained. His pain was relieved for 3 days and then returned. Since then he has had a colonoscopy which showed an inflamed area in his rectum at the site of the abscess. 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 Other symptoms include change is bowel habits.  This had been occurring for several weeks.  his bowel habits are regular and his bowel movements are usually soft, although he has seen a change in caliber.  his fiber intake is dietary.  his last colonoscopy was 3 days ago, where 2 small polyps were found In the ascending and descending colon. There was mild ileitis noted in the neoterminal ileum (patient had ileocecectomy for perforated appendicitis). An area of inflammation was noted in the rectum approximately 1/3 circumferential. Biopsies were done which showed inflammation but no signs of cancer.  he denies any fecal leakage or loose stools.  He denies personal or family h/o IBD. 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  Past Medical History  Diagnosis Date  . Hemorrhoid   . Thyroid disease       Past Surgical History  Procedure Laterality Date  . Arm surgery Right   . Appendectomy    . Abdominal surgery    . Gastric resection    . Hernia repair Right   . Cholecystectomy    . Incision and drainage perirectal abscess Left 12/27/2013    Procedure: IRRIGATION AND DEBRIDEMENT PERIRECTAL ABSCESS;  Surgeon: Edward Jolly, MD;  Location: [facility];  Service: General;  Laterality: Left;  . Colonoscopy          Current Outpatient Prescriptions  Medication Sig Dispense  Refill  . levothyroxine (SYNTHROID, LEVOTHROID) 175 MCG tablet Take 175 mcg by mouth daily before breakfast.      . Multiple Vitamins-Minerals (CENTRUM SILVER PO) Take 1 tablet by mouth daily.      . Pyridoxine HCl (B-6 PO) Take 1 tablet by mouth daily.      . traMADol (ULTRAM) 50 MG tablet Take 1 tablet (50 mg total) by mouth every 6 (six) hours as needed.  30 tablet  0  . ciprofloxacin (CIPRO) 500 MG tablet Take 1 tablet (500 mg total) by mouth 2 (two) times daily.  28 tablet  0  . metroNIDAZOLE (FLAGYL) 500 MG tablet Take 1 tablet (500 mg total) by mouth 3 (three) times daily.  42 tablet  0   No current facility-administered medications for this visit.      No Known Allergies    Family History  Problem Relation Age of Onset  . Colon cancer Father 50  . Cancer Father     colon  . Diabetes Maternal Aunt     x2  . Diabetes Maternal Uncle   . Diabetes      Mat great GM  . Heart disease Maternal Grandfather   . Cancer Maternal Grandfather     lymphoma    History   Social History  . Marital Status: Married    Spouse Name: Todd Elliott  Number of Children: 3  . Years of Education: N/A   Occupational History  . Dispatcher    Social History Main Topics  . Smoking status: Light Tobacco Smoker -- 35 years    Types: Cigarettes  . Smokeless tobacco: Never Used  . Alcohol Use: Yes     Comment: 3 x a week  . Drug Use: No  . Sexual Activity: None   Other Topics Concern  . None   Social History Narrative  . None      REVIEW OF SYSTEMS - PERTINENT POSITIVES ONLY: Review of Systems - General ROS: negative for - chills, fever or weight loss Hematological and Lymphatic ROS: negative for - bleeding problems, blood clots or bruising Respiratory ROS: no cough, shortness of breath, or wheezing Cardiovascular ROS: no chest pain or dyspnea on exertion Gastrointestinal ROS: no abdominal pain, change in bowel habits, or black or bloody stools Genito-Urinary ROS: no dysuria, trouble  voiding, or hematuria  EXAM: Filed Vitals:   01/23/14 1408  BP: 148/96  Pulse: 78  Temp: 98.7 F (37.1 C)  Resp: 18    General appearance: alert and cooperative Resp: clear to auscultation bilaterally Cardio: regular rate and rhythm GI: soft, non-tender; bowel sounds normal; no masses,  no organomegaly  Anal Exam Findings: Palpable mass in left lateral perianal region. Digital exam reveals no other concerning factors.    ASSESSMENT AND PLAN:  [name] Is a 58 year old male who is status post drainage of a perirectal abscess which was noted proximally in the perirectal space.  I think one of 2 things happened. He very his abscess recurred due to the proximal nature and the distal aspect of the wound closed before the proximal aspect to be drained completely. 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 I recommended an exam under anesthesia with evaluation for fistula. If one is found a seton will be placed. If no fistulous opening can be identified I will place a drain into the space and allow this to slowly close down as an outpatient. I've discussed this with him and he is in agreement. We'll place him on Cipro and Flagyl in the meantime to help minimize his inflammation. We will get this scheduled as soon as possible.   Rosario Adie, MD Colon and Rectal Surgery / [facility]      Visit Diagnoses: 1. Perirectal abscess     Primary Care Physician: PROVIDER NOT IN SYSTEM

## 2014-01-29 ENCOUNTER — Ambulatory Visit (INDEPENDENT_AMBULATORY_CARE_PROVIDER_SITE_OTHER): Payer: BC Managed Care – PPO | Admitting: General Surgery

## 2014-01-29 ENCOUNTER — Encounter (HOSPITAL_BASED_OUTPATIENT_CLINIC_OR_DEPARTMENT_OTHER): Payer: Self-pay

## 2014-01-29 NOTE — Anesthesia Preprocedure Evaluation (Addendum)
Anesthesia Evaluation  Patient identified by MRN, date of birth, ID band Patient awake    Reviewed: Allergy & Precautions, H&P , NPO status , Patient's Chart, lab work & pertinent test results  Airway Mallampati: II TM Distance: >3 FB Neck ROM: Full    Dental  (+) Edentulous Upper, Edentulous Lower, Dental Advisory Given   Pulmonary neg pulmonary ROS, Current Smoker,  breath sounds clear to auscultation  Pulmonary exam normal       Cardiovascular negative cardio ROS  Rhythm:Regular Rate:Normal     Neuro/Psych negative neurological ROS  negative psych ROS   GI/Hepatic negative GI ROS, Neg liver ROS,   Endo/Other  Hypothyroidism   Renal/GU negative Renal ROS  negative genitourinary   Musculoskeletal negative musculoskeletal ROS (+)   Abdominal   Peds negative pediatric ROS (+)  Hematology negative hematology ROS (+)   Anesthesia Other Findings   Reproductive/Obstetrics                          Anesthesia Physical Anesthesia Plan  ASA: II  Anesthesia Plan: General   Post-op Pain Management:    Induction: Intravenous  Airway Management Planned: Oral ETT  Additional Equipment:   Intra-op Plan:   Post-operative Plan: Extubation in OR  Informed Consent: I have reviewed the patients History and Physical, chart, labs and discussed the procedure including the risks, benefits and alternatives for the proposed anesthesia with the patient or authorized representative who has indicated his/her understanding and acceptance.   Dental advisory given  Plan Discussed with: CRNA  Anesthesia Plan Comments:         Anesthesia Quick Evaluation                                  Anesthesia Evaluation  Patient identified by MRN, date of birth, ID band Patient awake    Reviewed: Allergy & Precautions, H&P , NPO status , Patient's Chart, lab work & pertinent test  results  Airway Mallampati: II TM Distance: >3 FB Neck ROM: Full    Dental no notable dental hx.    Pulmonary neg pulmonary ROS, Current Smoker,  breath sounds clear to auscultation  Pulmonary exam normal       Cardiovascular negative cardio ROS  Rhythm:Regular Rate:Normal     Neuro/Psych negative neurological ROS  negative psych ROS   GI/Hepatic negative GI ROS, Neg liver ROS,   Endo/Other  negative endocrine ROS  Renal/GU negative Renal ROS  negative genitourinary   Musculoskeletal negative musculoskeletal ROS (+)   Abdominal   Peds negative pediatric ROS (+)  Hematology negative hematology ROS (+)   Anesthesia Other Findings   Reproductive/Obstetrics negative OB ROS                          Anesthesia Physical Anesthesia Plan  ASA: II  Anesthesia Plan: General   Post-op Pain Management:    Induction: Intravenous, Rapid sequence and Cricoid pressure planned  Airway Management Planned: Oral ETT  Additional Equipment:   Intra-op Plan:   Post-operative Plan: Extubation in OR  Informed Consent: I have reviewed the patients History and Physical, chart, labs and discussed the procedure including the risks, benefits and alternatives for the proposed anesthesia with the patient or authorized representative who has indicated his/her understanding and acceptance.   Dental advisory given  Plan Discussed with: CRNA  Anesthesia Plan  Comments:         Anesthesia Quick Evaluation                                   Anesthesia Evaluation  Patient identified by MRN, date of birth, ID band Patient awake    Reviewed: Allergy & Precautions, H&P , NPO status , Patient's Chart, lab work & pertinent test results  Airway Mallampati: II TM Distance: >3 FB Neck ROM: Full    Dental no notable dental hx.    Pulmonary neg pulmonary ROS, Current Smoker,  breath sounds clear to auscultation  Pulmonary exam  normal       Cardiovascular negative cardio ROS  Rhythm:Regular Rate:Normal     Neuro/Psych negative neurological ROS  negative psych ROS   GI/Hepatic negative GI ROS, Neg liver ROS,   Endo/Other  negative endocrine ROS  Renal/GU negative Renal ROS  negative genitourinary   Musculoskeletal negative musculoskeletal ROS (+)   Abdominal   Peds negative pediatric ROS (+)  Hematology negative hematology ROS (+)   Anesthesia Other Findings   Reproductive/Obstetrics negative OB ROS                          Anesthesia Physical Anesthesia Plan  ASA: II  Anesthesia Plan: General   Post-op Pain Management:    Induction: Intravenous, Rapid sequence and Cricoid pressure planned  Airway Management Planned: Oral ETT  Additional Equipment:   Intra-op Plan:   Post-operative Plan: Extubation in OR  Informed Consent: I have reviewed the patients History and Physical, chart, labs and discussed the procedure including the risks, benefits and alternatives for the proposed anesthesia with the patient or authorized representative who has indicated his/her understanding and acceptance.   Dental advisory given  Plan Discussed with: CRNA  Anesthesia Plan Comments:         Anesthesia Quick Evaluation

## 2014-01-30 ENCOUNTER — Encounter (HOSPITAL_BASED_OUTPATIENT_CLINIC_OR_DEPARTMENT_OTHER)
Admission: RE | Disposition: A | Discharge status: Home / Self Care | Payer: Self-pay | Source: Ambulatory Visit | Attending: General Surgery

## 2014-01-30 ENCOUNTER — Encounter (HOSPITAL_BASED_OUTPATIENT_CLINIC_OR_DEPARTMENT_OTHER): Payer: BC Managed Care – PPO | Admitting: Anesthesiology

## 2014-01-30 ENCOUNTER — Ambulatory Visit (HOSPITAL_BASED_OUTPATIENT_CLINIC_OR_DEPARTMENT_OTHER): Payer: BC Managed Care – PPO | Admitting: Anesthesiology

## 2014-01-30 ENCOUNTER — Encounter (HOSPITAL_BASED_OUTPATIENT_CLINIC_OR_DEPARTMENT_OTHER): Payer: Self-pay | Admitting: *Deleted

## 2014-01-30 ENCOUNTER — Ambulatory Visit (HOSPITAL_BASED_OUTPATIENT_CLINIC_OR_DEPARTMENT_OTHER)
Admission: RE | Admit: 2014-01-30 | Discharge: 2014-01-30 | Disposition: A | Discharge status: Home / Self Care | Payer: BC Managed Care – PPO | Source: Ambulatory Visit | Attending: General Surgery | Admitting: General Surgery

## 2014-01-30 DIAGNOSIS — K612 Anorectal abscess: Secondary | ICD-10-CM

## 2014-01-30 DIAGNOSIS — Z860100 Personal history of colon polyps, unspecified: Secondary | ICD-10-CM | POA: Insufficient documentation

## 2014-01-30 DIAGNOSIS — K603 Anal fistula, unspecified: Secondary | ICD-10-CM | POA: Insufficient documentation

## 2014-01-30 DIAGNOSIS — Z79899 Other long term (current) drug therapy: Secondary | ICD-10-CM | POA: Insufficient documentation

## 2014-01-30 DIAGNOSIS — Z9089 Acquired absence of other organs: Secondary | ICD-10-CM | POA: Insufficient documentation

## 2014-01-30 DIAGNOSIS — F17200 Nicotine dependence, unspecified, uncomplicated: Secondary | ICD-10-CM | POA: Insufficient documentation

## 2014-01-30 DIAGNOSIS — E039 Hypothyroidism, unspecified: Secondary | ICD-10-CM | POA: Insufficient documentation

## 2014-01-30 HISTORY — DX: Hypothyroidism, unspecified: E03.9

## 2014-01-30 HISTORY — PX: ANAL FISTULOTOMY: SHX6423

## 2014-01-30 HISTORY — PX: EXAMINATION UNDER ANESTHESIA: SHX1540

## 2014-01-30 LAB — POCT I-STAT, CHEM 8
BUN: 18 mg/dL (ref 6–23)
CALCIUM ION: 1.29 mmol/L — AB (ref 1.12–1.23)
CHLORIDE: 105 meq/L (ref 96–112)
Creatinine, Ser: 0.8 mg/dL (ref 0.50–1.35)
Glucose, Bld: 112 mg/dL — ABNORMAL HIGH (ref 70–99)
HEMATOCRIT: 50 % (ref 39.0–52.0)
Hemoglobin: 17 g/dL (ref 13.0–17.0)
Potassium: 3.9 mEq/L (ref 3.7–5.3)
SODIUM: 144 meq/L (ref 137–147)
TCO2: 23 mmol/L (ref 0–100)

## 2014-01-30 SURGERY — EXAM UNDER ANESTHESIA
Anesthesia: General | Site: Anus

## 2014-01-30 MED ORDER — ONDANSETRON HCL 4 MG/2ML IJ SOLN
INTRAMUSCULAR | Status: DC | PRN
  Administered 2014-01-30: 4 mg via INTRAVENOUS

## 2014-01-30 MED ORDER — MIDAZOLAM HCL 5 MG/5ML IJ SOLN
INTRAMUSCULAR | Status: DC | PRN
  Administered 2014-01-30: 2 mg via INTRAVENOUS

## 2014-01-30 MED ORDER — LACTATED RINGERS IV SOLN
INTRAVENOUS | Status: DC
  Administered 2014-01-30 (×2): via INTRAVENOUS
  Filled 2014-01-30: qty 1000

## 2014-01-30 MED ORDER — MIDAZOLAM HCL 2 MG/2ML IJ SOLN
INTRAMUSCULAR | Status: AC
  Filled 2014-01-30: qty 2

## 2014-01-30 MED ORDER — FENTANYL CITRATE 0.05 MG/ML IJ SOLN
INTRAMUSCULAR | Status: AC
  Filled 2014-01-30: qty 4

## 2014-01-30 MED ORDER — OXYCODONE-ACETAMINOPHEN 5-325 MG PO TABS: 1.0000 | ORAL_TABLET | ORAL | Status: DC | PRN

## 2014-01-30 MED ORDER — SODIUM CHLORIDE 0.9 % IV SOLN
250.0000 mL | INTRAVENOUS | Status: DC | PRN
  Filled 2014-01-30: qty 250

## 2014-01-30 MED ORDER — LACTATED RINGERS IV SOLN
INTRAVENOUS | Status: DC
  Filled 2014-01-30: qty 1000

## 2014-01-30 MED ORDER — SODIUM CHLORIDE 0.9 % IJ SOLN
3.0000 mL | INTRAMUSCULAR | Status: DC | PRN
  Filled 2014-01-30: qty 3

## 2014-01-30 MED ORDER — FENTANYL CITRATE 0.05 MG/ML IJ SOLN
INTRAMUSCULAR | Status: DC | PRN
  Administered 2014-01-30: 100 ug via INTRAVENOUS

## 2014-01-30 MED ORDER — DOCUSATE SODIUM 100 MG PO CAPS: 100.0000 mg | ORAL_CAPSULE | Freq: Two times a day (BID) | ORAL | Status: DC

## 2014-01-30 MED ORDER — PROPOFOL 10 MG/ML IV BOLUS
INTRAVENOUS | Status: DC | PRN
  Administered 2014-01-30: 200 mg via INTRAVENOUS

## 2014-01-30 MED ORDER — DEXAMETHASONE SODIUM PHOSPHATE 4 MG/ML IJ SOLN
INTRAMUSCULAR | Status: DC | PRN
  Administered 2014-01-30: 10 mg via INTRAVENOUS

## 2014-01-30 MED ORDER — ACETAMINOPHEN 325 MG PO TABS
650.0000 mg | ORAL_TABLET | ORAL | Status: DC | PRN
  Filled 2014-01-30: qty 2

## 2014-01-30 MED ORDER — BUPIVACAINE LIPOSOME 1.3 % IJ SUSP
INTRAMUSCULAR | Status: DC | PRN
  Administered 2014-01-30: 20 mL

## 2014-01-30 MED ORDER — BUPIVACAINE-EPINEPHRINE 0.5% -1:200000 IJ SOLN
INTRAMUSCULAR | Status: DC | PRN
  Administered 2014-01-30: 20 mL

## 2014-01-30 MED ORDER — PROMETHAZINE HCL 25 MG/ML IJ SOLN
6.2500 mg | INTRAMUSCULAR | Status: DC | PRN
  Filled 2014-01-30: qty 1

## 2014-01-30 MED ORDER — SUCCINYLCHOLINE CHLORIDE 20 MG/ML IJ SOLN
INTRAMUSCULAR | Status: DC | PRN
  Administered 2014-01-30: 140 mg via INTRAVENOUS

## 2014-01-30 MED ORDER — SODIUM CHLORIDE 0.9 % IR SOLN
Status: DC | PRN
  Administered 2014-01-30: 500 mL

## 2014-01-30 MED ORDER — HYDROMORPHONE HCL PF 1 MG/ML IJ SOLN
0.2500 mg | INTRAMUSCULAR | Status: DC | PRN
  Filled 2014-01-30: qty 1

## 2014-01-30 MED ORDER — LIDOCAINE HCL (CARDIAC) 20 MG/ML IV SOLN
INTRAVENOUS | Status: DC | PRN
  Administered 2014-01-30: 70 mg via INTRAVENOUS

## 2014-01-30 MED ORDER — SODIUM CHLORIDE 0.9 % IJ SOLN
3.0000 mL | Freq: Two times a day (BID) | INTRAMUSCULAR | Status: DC
  Filled 2014-01-30: qty 3

## 2014-01-30 MED ORDER — OXYCODONE HCL 5 MG PO TABS
5.0000 mg | ORAL_TABLET | ORAL | Status: DC | PRN
  Filled 2014-01-30: qty 2

## 2014-01-30 MED ORDER — EPHEDRINE SULFATE 50 MG/ML IJ SOLN
INTRAMUSCULAR | Status: DC | PRN
  Administered 2014-01-30 (×2): 10 mg via INTRAVENOUS

## 2014-01-30 MED ORDER — ACETAMINOPHEN 650 MG RE SUPP
650.0000 mg | RECTAL | Status: DC | PRN
  Filled 2014-01-30: qty 1

## 2014-01-30 SURGICAL SUPPLY — 43 items
APL SKNCLS STERI-STRIP NONHPOA (GAUZE/BANDAGES/DRESSINGS) ×2
BENZOIN TINCTURE PRP APPL 2/3 (GAUZE/BANDAGES/DRESSINGS) ×4 IMPLANT
BLADE HEX COATED 2.75 (ELECTRODE) ×4 IMPLANT
BLADE SURG 15 STRL LF DISP TIS (BLADE) ×2 IMPLANT
BLADE SURG 15 STRL SS (BLADE) ×4
BRIEF STRETCH FOR OB PAD LRG (UNDERPADS AND DIAPERS) ×5 IMPLANT
CANISTER SUCTION 2500CC (MISCELLANEOUS) ×4 IMPLANT
COVER MAYO STAND STRL (DRAPES) ×4 IMPLANT
COVER TABLE BACK 60X90 (DRAPES) ×4 IMPLANT
DRAIN PENROSE 18X1/4 LTX STRL (WOUND CARE) ×3 IMPLANT
DRAPE LG THREE QUARTER DISP (DRAPES) ×4 IMPLANT
DRAPE PED LAPAROTOMY (DRAPES) ×4 IMPLANT
DRSG PAD ABDOMINAL 8X10 ST (GAUZE/BANDAGES/DRESSINGS) ×3 IMPLANT
ELECT REM PT RETURN 9FT ADLT (ELECTROSURGICAL) ×4
ELECTRODE REM PT RTRN 9FT ADLT (ELECTROSURGICAL) ×2 IMPLANT
GAUZE SPONGE 4X4 16PLY XRAY LF (GAUZE/BANDAGES/DRESSINGS) ×4 IMPLANT
GAUZE VASELINE 3X9 (GAUZE/BANDAGES/DRESSINGS) IMPLANT
GLOVE BIO SURGEON STRL SZ 6.5 (GLOVE) ×6 IMPLANT
GLOVE BIO SURGEONS STRL SZ 6.5 (GLOVE) ×2
GLOVE BIOGEL M STER SZ 6 (GLOVE) ×3 IMPLANT
GLOVE INDICATOR 6.5 STRL GRN (GLOVE) ×3 IMPLANT
GLOVE INDICATOR 7.0 STRL GRN (GLOVE) ×5 IMPLANT
GOWN PREVENTION PLUS LG XLONG (DISPOSABLE) ×4 IMPLANT
GOWN STRL REUS W/TWL 2XL LVL3 (GOWN DISPOSABLE) ×4 IMPLANT
GOWN STRL REUS W/TWL LRG LVL3 (GOWN DISPOSABLE) ×3 IMPLANT
HEMOSTAT SURGICEL 2X14 (HEMOSTASIS) ×1 IMPLANT
HYDROGEN PEROXIDE 16OZ (MISCELLANEOUS) IMPLANT
LOOP VESSEL MAXI BLUE (MISCELLANEOUS) IMPLANT
NEEDLE HYPO 22GX1.5 SAFETY (NEEDLE) ×4 IMPLANT
NS IRRIG 500ML POUR BTL (IV SOLUTION) ×4 IMPLANT
PACK BASIN DAY SURGERY FS (CUSTOM PROCEDURE TRAY) ×4 IMPLANT
PAD ABD 8X10 STRL (GAUZE/BANDAGES/DRESSINGS) IMPLANT
PENCIL BUTTON HOLSTER BLD 10FT (ELECTRODE) ×4 IMPLANT
SPONGE GAUZE 4X4 12PLY (GAUZE/BANDAGES/DRESSINGS) IMPLANT
SPONGE GAUZE 4X4 12PLY STER LF (GAUZE/BANDAGES/DRESSINGS) ×3 IMPLANT
SPONGE SURGIFOAM ABS GEL 12-7 (HEMOSTASIS) IMPLANT
SUT ETHIBOND 0 (SUTURE) ×3 IMPLANT
SYR CONTROL 10ML LL (SYRINGE) ×4 IMPLANT
TOWEL OR 17X24 6PK STRL BLUE (TOWEL DISPOSABLE) ×8 IMPLANT
TRAY DSU PREP LF (CUSTOM PROCEDURE TRAY) ×4 IMPLANT
TUBE CONNECTING 12'X1/4 (SUCTIONS) ×1
TUBE CONNECTING 12X1/4 (SUCTIONS) ×3 IMPLANT
YANKAUER SUCT BULB TIP NO VENT (SUCTIONS) ×4 IMPLANT

## 2014-01-30 NOTE — Transfer of Care (Signed)
Immediate Anesthesia Transfer of Care Note  Patient: Todd Elliott  Procedure(s) Performed: Procedure(s) (LRB): EXAM UNDER ANESTHESIA (N/A) ANAL FISTULOTOMY WITH DRAIN PLACEMENT (N/A)  Patient Location: PACU  Anesthesia Type: General  Level of Consciousness: drowsy  Airway & Oxygen Therapy: Patient Spontanous Breathing and Patient connected to face mask oxygen  Post-op Assessment: Report given to PACU RN and Post -op Vital signs reviewed and stable  Post vital signs: Reviewed and stable  Complications: No apparent anesthesia complications

## 2014-01-30 NOTE — Anesthesia Procedure Notes (Signed)
Procedure Name: Intubation Date/Time: 01/30/2014 8:58 AM Performed by: Mechele Claude Pre-anesthesia Checklist: Patient identified, Emergency Drugs available, Suction available and Patient being monitored Patient Re-evaluated:Patient Re-evaluated prior to inductionOxygen Delivery Method: Circle System Utilized Preoxygenation: Pre-oxygenation with 100% oxygen Intubation Type: IV induction Ventilation: Mask ventilation without difficulty Laryngoscope Size: Mac and 4 Grade View: Grade I Tube type: Oral Number of attempts: 1 Airway Equipment and Method: stylet Placement Confirmation: ETT inserted through vocal cords under direct vision,  positive ETCO2 and breath sounds checked- equal and bilateral Secured at: 21 cm Tube secured with: Tape Dental Injury: Teeth and Oropharynx as per pre-operative assessment

## 2014-01-30 NOTE — Interval H&P Note (Signed)
History and Physical Interval Note:  01/30/2014 8:38 AM  Todd Elliott  has presented today for surgery, with the diagnosis of anal/abcess  The various methods of treatment have been discussed with the patient and family. After consideration of risks, benefits and other options for treatment, the patient has consented to  Procedure(s): EXAM UNDER ANESTHESIA (N/A) PLACEMENT OF SETON (N/A) as a surgical intervention .  The patient's history has been reviewed, patient examined, no change in status, stable for surgery.  I have reviewed the patient's chart and labs.  Questions were answered to the patient's satisfaction.     Rosario Adie, MD  Colorectal and [facility]

## 2014-01-30 NOTE — H&P (View-Only) (Signed)
Chief Complaint  Patient presents with  . Rectal Problems    new pt- eval rectal mass    HISTORY: Todd Elliott is a 58 y.o. male who presents to the office with rectal pain.  He states that this began several weeks ago.  He saw my partner Dr. Excell Seltzer who explored him in the operating room. An abscess was found proximally in the rectum and drained. His pain was relieved for 3 days and then returned. Since then he has had a colonoscopy which showed an inflamed area in his rectum at the site of the abscess. 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 Other symptoms include change is bowel habits.  This had been occurring for several weeks.  his bowel habits are regular and his bowel movements are usually soft, although he has seen a change in caliber.  his fiber intake is dietary.  his last colonoscopy was 3 days ago, where 2 small polyps were found In the ascending and descending colon. There was mild ileitis noted in the neoterminal ileum (patient had ileocecectomy for perforated appendicitis). An area of inflammation was noted in the rectum approximately 1/3 circumferential. Biopsies were done which showed inflammation but no signs of cancer.  he denies any fecal leakage or loose stools.  He denies personal or family h/o IBD. 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  Past Medical History  Diagnosis Date  . Hemorrhoid   . Thyroid disease       Past Surgical History  Procedure Laterality Date  . Arm surgery Right   . Appendectomy    . Abdominal surgery    . Gastric resection    . Hernia repair Right   . Cholecystectomy    . Incision and drainage perirectal abscess Left 12/27/2013    Procedure: IRRIGATION AND DEBRIDEMENT PERIRECTAL ABSCESS;  Surgeon: Edward Jolly, MD;  Location: [facility];  Service: General;  Laterality: Left;  . Colonoscopy          Current Outpatient Prescriptions  Medication Sig Dispense  Refill  . levothyroxine (SYNTHROID, LEVOTHROID) 175 MCG tablet Take 175 mcg by mouth daily before breakfast.      . Multiple Vitamins-Minerals (CENTRUM SILVER PO) Take 1 tablet by mouth daily.      . Pyridoxine HCl (B-6 PO) Take 1 tablet by mouth daily.      . traMADol (ULTRAM) 50 MG tablet Take 1 tablet (50 mg total) by mouth every 6 (six) hours as needed.  30 tablet  0  . ciprofloxacin (CIPRO) 500 MG tablet Take 1 tablet (500 mg total) by mouth 2 (two) times daily.  28 tablet  0  . metroNIDAZOLE (FLAGYL) 500 MG tablet Take 1 tablet (500 mg total) by mouth 3 (three) times daily.  42 tablet  0   No current facility-administered medications for this visit.      No Known Allergies    Family History  Problem Relation Age of Onset  . Colon cancer Father 44  . Cancer Father     colon  . Diabetes Maternal Aunt     x2  . Diabetes Maternal Uncle   . Diabetes      Mat great GM  . Heart disease Maternal Grandfather   . Cancer Maternal Grandfather     lymphoma    History   Social History  . Marital Status: Married    Spouse Name: Todd Elliott  Number of Children: 3  . Years of Education: N/A   Occupational History  . Dispatcher    Social History Main Topics  . Smoking status: Light Tobacco Smoker -- 35 years    Types: Cigarettes  . Smokeless tobacco: Never Used  . Alcohol Use: Yes     Comment: 3 x a week  . Drug Use: No  . Sexual Activity: None   Other Topics Concern  . None   Social History Narrative  . None      REVIEW OF SYSTEMS - PERTINENT POSITIVES ONLY: Review of Systems - General ROS: negative for - chills, fever or weight loss Hematological and Lymphatic ROS: negative for - bleeding problems, blood clots or bruising Respiratory ROS: no cough, shortness of breath, or wheezing Cardiovascular ROS: no chest pain or dyspnea on exertion Gastrointestinal ROS: no abdominal pain, change in bowel habits, or black or bloody stools Genito-Urinary ROS: no dysuria, trouble  voiding, or hematuria  EXAM: Filed Vitals:   01/23/14 1408  BP: 148/96  Pulse: 78  Temp: 98.7 F (37.1 C)  Resp: 18    General appearance: alert and cooperative Resp: clear to auscultation bilaterally Cardio: regular rate and rhythm GI: soft, non-tender; bowel sounds normal; no masses,  no organomegaly  Anal Exam Findings: Palpable mass in left lateral perianal region. Digital exam reveals no other concerning factors.    ASSESSMENT AND PLAN:  Todd Elliott Is a 58 year old male who is status post drainage of a perirectal abscess which was noted proximally in the perirectal space.  I think one of 2 things happened. He very his abscess recurred due to the proximal nature and the distal aspect of the wound closed before the proximal aspect to be drained completely. 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 I recommended an exam under anesthesia with evaluation for fistula. If one is found a seton will be placed. If no fistulous opening can be identified I will place a drain into the space and allow this to slowly close down as an outpatient. I've discussed this with him and he is in agreement. We'll place him on Cipro and Flagyl in the meantime to help minimize his inflammation. We will get this scheduled as soon as possible.   Rosario Adie, MD Colon and Rectal Surgery / [facility]      Visit Diagnoses: 1. Perirectal abscess     Primary Care Physician: PROVIDER NOT IN SYSTEM

## 2014-01-30 NOTE — Discharge Instructions (Addendum)
ANORECTAL SURGERY: POST OP INSTRUCTIONS °1. Take your usually prescribed home medications unless otherwise directed. °2. DIET: During the first few hours after surgery sip on some liquids until you are able to urinate.  It is normal to not urinate for several hours after this surgery.  If you feel uncomfortable, please contact the office for instructions.  After you are able to urinate,you may eat, if you feel like it.  Follow a light bland diet the first 24 hours after arrival home, such as soup, liquids, crackers, etc.  Be sure to include lots of fluids daily (6-8 glasses).  Avoid fast food or heavy meals, as your are more likely to get nauseated.  Eat a low fat diet the next few days after surgery.  Limit caffeine intake to 1-2 servings a day. °3. PAIN CONTROL: °a. Pain is best controlled by a usual combination of several different methods TOGETHER: °i. Muscle relaxation °1.  Soak in a warm bath (or Sitz bath) three times a day and after bowel movements.  Continue to do this until all pain is resolved. °ii. Over the counter pain medication °iii. Prescription pain medication °b. Most patients will experience some swelling and discomfort in the anus/rectal area and incisions.  Heat such as warm towels, sitz baths, warm baths, etc to help relax tight/sore spots and speed recovery.  Some people prefer to use ice, especially in the first couple days after surgery, as it may decrease the pain and swelling, or alternate between ice & heat.  Experiment to what works for you.  Swelling and bruising can take several weeks to resolve.  Pain can take even longer to completely resolve. °c. It is helpful to take an over-the-counter pain medication regularly for the first few weeks.  Choose one of the following that works best for you: °i. Naproxen (Aleve, etc)  Two 220mg tabs twice a day °ii. Ibuprofen (Advil, etc) Three 200mg tabs four times a day (every meal & bedtime) °d. A  prescription for pain medication (such as  percocet, oxycodone, hydrocodone, etc) should be given to you upon discharge.  Take your pain medication as prescribed.  °i. If you are having problems/concerns with the prescription medicine (does not control pain, nausea, vomiting, rash, itching, etc), please call us [phone] to see if we need to switch you to a different pain medicine that will work better for you and/or control your side effect better. °ii. If you need a refill on your pain medication, please contact your pharmacy.  They will contact our office to request authorization. Prescriptions will not be filled after 5 pm or on week-ends. °4. KEEP YOUR BOWELS REGULAR and AVOID CONSTIPATION °a. The goal is one to two soft bowel movements a day.  You should at least have a bowel movement every other day. °b. Avoid getting constipated.  Between the surgery and the pain medications, it is common to experience some constipation. This can be very painful after rectal surgery.  Increasing fluid intake and taking a fiber supplement (such as Metamucil, Citrucel, FiberCon, etc) 1-2 times a day regularly will usually help prevent this problem from occurring.  A stool softener like colace is also recommended.  This can be purchased over the counter at your pharmacy.  You can take it up to 3 times a day.  If you do not have a bowel movement after 24 hrs since your surgery, take one does of milk of magnesia.  If you still haven't had a bowel movement 8-12   hours after that dose, take another dose.  If you don't have a bowel movement 48 hrs after surgery, purchase a Fleets enema from the drug store and administer gently per package instructions.  If you still are having trouble with your bowel movements after that, please call the office for further instructions. °c. If you develop diarrhea or have many loose bowel movements, simplify your diet to bland foods & liquids for a few days.  Stop any stool softeners and decrease your fiber supplement.  Switching to mild  anti-diarrheal medications (Kayopectate, Pepto Bismol) can help.  If this worsens or does not improve, please call us. ° °5. Wound Care °a. Remove your bandages before your first bowel movement or 8 hours after surgery.     °b. Remove any wound packing material at this tim,e as well.  You do not need to repack the wound unless instructed otherwise.  Wear an absorbent pad or soft cotton gauze in your underwear to catch any drainage and help keep the area clean. You should change this every 2-3 hours while awake. °c. Keep the area clean and dry.  Bathe / shower every day, especially after bowel movements.  Keep the area clean by showering / bathing over the incision / wound.   It is okay to soak an open wound to help wash it.  Wet wipes or showers / gentle washing after bowel movements is often less traumatic than regular toilet paper. °d. You may have some styrofoam-like soft packing in the rectum which will come out with the first bowel movement.  °e. You will often notice bleeding with bowel movements.  This should slow down by the end of the first week of surgery °f. Expect some drainage.  This should slow down, too, by the end of the first week of surgery.  Wear an absorbent pad or soft cotton gauze in your underwear until the drainage stops. °g. Do Not sit on a rubber or pillow ring.  This can make you symptoms worse.  You may sit on a soft pillow if needed.  °6. ACTIVITIES as tolerated:   °a. You may resume regular (light) daily activities beginning the next day--such as daily self-care, walking, climbing stairs--gradually increasing activities as tolerated.  If you can walk 30 minutes without difficulty, it is safe to try more intense activity such as jogging, treadmill, bicycling, low-impact aerobics, swimming, etc. °b. Save the most intensive and strenuous activity for last such as sit-ups, heavy lifting, contact sports, etc  Refrain from any heavy lifting or straining until you are off narcotics for pain  control.   °c. You may drive when you are no longer taking prescription pain medication, you can comfortably sit for long periods of time, and you can safely maneuver your car and apply brakes. °d. You may have sexual intercourse when it is comfortable.  °7. FOLLOW UP in our office °a. Please call [facility] at [phone] to set up an appointment to see your surgeon in the office for a follow-up appointment approximately 3-4 weeks after your surgery. °b. Make sure that you call for this appointment the day you arrive home to insure a convenient appointment time. °10. IF YOU HAVE DISABILITY OR FAMILY LEAVE FORMS, BRING THEM TO THE OFFICE FOR PROCESSING.  DO NOT GIVE THEM TO YOUR DOCTOR. ° ° ° ° °WHEN TO CALL US [phone]: °1. Poor pain control °2. Reactions / problems with new medications (rash/itching, nausea, etc)  °3. Fever over 101.5 F (38.5   C) 4. Inability to urinate 5. Nausea and/or vomiting 6. Worsening swelling or bruising 7. Continued bleeding from incision. 8. Increased pain, redness, or drainage from the incision  The clinic staff is available to answer your questions during regular business hours (8:30am-5pm).  Please dont hesitate to call and ask to speak to one of our nurses for clinical concerns.   A surgeon from [facility] is always on call at the hospitals   If you have a medical emergency, go to the nearest emergency room or call 911.    [facility], [address], [place], [place], [place]  [id number] ? MAIN: [phone] ? TOLL FREE: [phone] ? FAX [phone] [email or web address]    Post Anesthesia Home Care Instructions  Activity: Get plenty of rest for the remainder of the day. A responsible adult should stay with you for 24 hours following the procedure.  For the next 24 hours, DO NOT: -Drive a car -Paediatric nurse -Drink alcoholic beverages -Take any medication unless instructed by your physician -Make  any legal decisions or sign important papers.  Meals: Start with liquid foods such as gelatin or soup. Progress to regular foods as tolerated. Avoid greasy, spicy, heavy foods. If nausea and/or vomiting occur, drink only clear liquids until the nausea and/or vomiting subsides. Call your physician if vomiting continues.  Special Instructions/Symptoms: Your throat may feel dry or sore from the anesthesia or the breathing tube placed in your throat during surgery. If this causes discomfort, gargle with warm salt water. The discomfort should disappear within 24 hours. Information for Discharge Teaching: EXPAREL (bupivacaine liposome injectable suspension)   Your surgeon gave you EXPAREL(bupivacaine) in your surgical incision to help control your pain after surgery.   EXPAREL is a local anesthetic that provides pain relief by numbing the tissue around the surgical site.  EXPAREL is designed to release pain medication over time and can control pain for up to 72 hours.  Depending on how you respond to EXPAREL, you may require less pain medication during your recovery.  Possible side effects:  Temporary loss of sensation or ability to move in the area where bupivacaine was injected.  Nausea, vomiting, constipation  Rarely, numbness and tingling in your mouth or lips, lightheadedness, or anxiety may occur.  Call your doctor right away if you think you may be experiencing any of these sensations, or if you have other questions regarding possible side effects.  Follow all other discharge instructions given to you by your surgeon or nurse. Eat a healthy diet and drink plenty of water or other fluids.  If you return to the hospital for any reason within 96 hours following the administration of EXPAREL, please inform your health care providers.

## 2014-01-30 NOTE — Op Note (Signed)
01/30/2014  9:41 AM  PATIENT:  Todd Elliott  58 y.o. male  Patient Care Team: Provider Not In System as PCP - General  PRE-OPERATIVE DIAGNOSIS:  Anal abcess  POST-OPERATIVE DIAGNOSIS:  HORSESHOE FISTULA with abscess  PROCEDURE: EXAM UNDER ANESTHESIA ANAL FISTULOTOMY WITH DRAIN PLACEMENT Lannette Donath Procedure)   SURGEON:  Surgeon(s): [name], MD  ASSISTANT: none   ANESTHESIA:   local and general  SPECIMEN:  No Specimen  DISPOSITION OF SPECIMEN:  N/A  COUNTS:  YES  PLAN OF CARE: Discharge to home after PACU  PATIENT DISPOSITION:  PACU - hemodynamically stable.  INDICATION: This is a 58 y.o. with a recurrent anal abscess after drainage.  Colonoscopy reveals a area of inflammation in the distal rectum concerning for an internal opening.   OR FINDINGS: Horseshoe fistula, unilateral   DESCRIPTION: the patient was identified in the preoperative holding area and taken to the OR where they were laid on the operating room table.  General anesthesia was induced without difficulty. The patient was then positioned in prone jackknife position with buttocks gently taped apart.  The patient was then prepped and draped in usual sterile fashion.  SCDs were noted to be in place prior to the initiation of anesthesia. A surgical timeout was performed indicating the correct patient, procedure, positioning and need for preoperative antibiotics.  I began with a digital rectal exam.  I placed a rectal block using Marcaine with epinephrine.  I then placed a Hill-Ferguson anoscope into the anal canal and evaluated this completely.   I could easily identify a internal opening in the proximal posterior midline. Purulence was expressed from this opening. I then made an incision in the right lateral sidewall and identified purulence consistent with abscess. A fistula probe was inserted into the abscess cavity. There was no clear path noted to the internal opening. I then made a counterincision in the  posterior midline using a scalpel. Blunt dissection was carried down to the level of the deep postanal space. This was also entered bluntly using a hemostat. I then entered the internal opening at that time. This confirmed a horseshoe fistula with a deep postanal abscess. I internally opened the internal fistula at the level of the deep posterior space. I could easily palpate coccyx confirming the correct place.  I then placed a one quarter-inch Penrose drain through the lateral opening and through the posterior external opening. This was tied into a loop using Ethibond sutures x3. I then injected Exparel to assist with postoperative pain control.

## 2014-01-31 ENCOUNTER — Telehealth (INDEPENDENT_AMBULATORY_CARE_PROVIDER_SITE_OTHER): Payer: Self-pay

## 2014-01-31 ENCOUNTER — Encounter (HOSPITAL_BASED_OUTPATIENT_CLINIC_OR_DEPARTMENT_OTHER): Payer: Self-pay | Admitting: General Surgery

## 2014-01-31 NOTE — Telephone Encounter (Signed)
Spoke to pt and scheduled appt for 7/6 @ 3:35pm.

## 2014-02-06 ENCOUNTER — Ambulatory Visit (INDEPENDENT_AMBULATORY_CARE_PROVIDER_SITE_OTHER): Payer: BC Managed Care – PPO | Admitting: General Surgery

## 2014-02-10 ENCOUNTER — Encounter (HOSPITAL_COMMUNITY): Payer: Self-pay | Admitting: Emergency Medicine

## 2014-02-10 ENCOUNTER — Encounter (HOSPITAL_COMMUNITY)
Admission: EM | Disposition: A | Discharge status: Home / Self Care | Payer: Self-pay | Source: Home / Self Care | Attending: Emergency Medicine

## 2014-02-10 ENCOUNTER — Emergency Department (HOSPITAL_COMMUNITY): Payer: BC Managed Care – PPO | Admitting: Anesthesiology

## 2014-02-10 ENCOUNTER — Encounter (HOSPITAL_COMMUNITY): Payer: BC Managed Care – PPO | Admitting: Anesthesiology

## 2014-02-10 ENCOUNTER — Observation Stay (HOSPITAL_COMMUNITY)
Admission: EM | Admit: 2014-02-10 | Discharge: 2014-02-11 | Disposition: A | Discharge status: Home / Self Care | Payer: BC Managed Care – PPO | Attending: General Surgery | Admitting: General Surgery

## 2014-02-10 DIAGNOSIS — E039 Hypothyroidism, unspecified: Secondary | ICD-10-CM | POA: Insufficient documentation

## 2014-02-10 DIAGNOSIS — K612 Anorectal abscess: Principal | ICD-10-CM | POA: Insufficient documentation

## 2014-02-10 DIAGNOSIS — K610 Anal abscess: Secondary | ICD-10-CM | POA: Diagnosis present

## 2014-02-10 DIAGNOSIS — F17200 Nicotine dependence, unspecified, uncomplicated: Secondary | ICD-10-CM | POA: Insufficient documentation

## 2014-02-10 DIAGNOSIS — K611 Rectal abscess: Secondary | ICD-10-CM

## 2014-02-10 HISTORY — PX: INCISION AND DRAINAGE PERIRECTAL ABSCESS: SHX1804

## 2014-02-10 LAB — BASIC METABOLIC PANEL
BUN: 10 mg/dL (ref 6–23)
CHLORIDE: 97 meq/L (ref 96–112)
CO2: 23 mEq/L (ref 19–32)
Calcium: 9.3 mg/dL (ref 8.4–10.5)
Creatinine, Ser: 0.83 mg/dL (ref 0.50–1.35)
Glucose, Bld: 119 mg/dL — ABNORMAL HIGH (ref 70–99)
Potassium: 3.6 mEq/L — ABNORMAL LOW (ref 3.7–5.3)
SODIUM: 135 meq/L — AB (ref 137–147)

## 2014-02-10 LAB — CBC
HEMATOCRIT: 43.7 % (ref 39.0–52.0)
Hemoglobin: 14.6 g/dL (ref 13.0–17.0)
MCH: 29.7 pg (ref 26.0–34.0)
MCHC: 33.4 g/dL (ref 30.0–36.0)
MCV: 89 fL (ref 78.0–100.0)
Platelets: 210 10*3/uL (ref 150–400)
RBC: 4.91 MIL/uL (ref 4.22–5.81)
RDW: 12.9 % (ref 11.5–15.5)
WBC: 17.7 10*3/uL — AB (ref 4.0–10.5)

## 2014-02-10 LAB — I-STAT CG4 LACTIC ACID, ED: LACTIC ACID, VENOUS: 0.85 mmol/L (ref 0.5–2.2)

## 2014-02-10 SURGERY — INCISION AND DRAINAGE, ABSCESS, PERIRECTAL
Anesthesia: General | Site: Buttocks

## 2014-02-10 MED ORDER — HYDROMORPHONE HCL PF 1 MG/ML IJ SOLN
0.2500 mg | INTRAMUSCULAR | Status: DC | PRN
  Administered 2014-02-10: 0.5 mg via INTRAVENOUS

## 2014-02-10 MED ORDER — HYDROMORPHONE HCL PF 1 MG/ML IJ SOLN
1.0000 mg | Freq: Once | INTRAMUSCULAR | Status: AC
  Administered 2014-02-10: 1 mg via INTRAVENOUS
  Filled 2014-02-10: qty 1

## 2014-02-10 MED ORDER — HYDROMORPHONE HCL PF 1 MG/ML IJ SOLN
INTRAMUSCULAR | Status: AC
  Filled 2014-02-10: qty 1

## 2014-02-10 MED ORDER — PHENYLEPHRINE HCL 10 MG/ML IJ SOLN
INTRAMUSCULAR | Status: DC | PRN
  Administered 2014-02-10: 80 ug via INTRAVENOUS

## 2014-02-10 MED ORDER — ONDANSETRON HCL 4 MG/2ML IJ SOLN
INTRAMUSCULAR | Status: AC
  Filled 2014-02-10: qty 2

## 2014-02-10 MED ORDER — BUPIVACAINE-EPINEPHRINE (PF) 0.5% -1:200000 IJ SOLN
INTRAMUSCULAR | Status: AC
  Filled 2014-02-10: qty 30

## 2014-02-10 MED ORDER — GLYCOPYRROLATE 0.2 MG/ML IJ SOLN
INTRAMUSCULAR | Status: AC
  Filled 2014-02-10: qty 2

## 2014-02-10 MED ORDER — SODIUM CHLORIDE 0.9 % IV SOLN
INTRAVENOUS | Status: DC
  Administered 2014-02-10: 16:00:00 via INTRAVENOUS

## 2014-02-10 MED ORDER — DOCUSATE SODIUM 100 MG PO CAPS
100.0000 mg | ORAL_CAPSULE | Freq: Two times a day (BID) | ORAL | Status: DC
  Administered 2014-02-10 (×2): 100 mg via ORAL
  Filled 2014-02-10 (×2): qty 1

## 2014-02-10 MED ORDER — ONDANSETRON HCL 4 MG/2ML IJ SOLN: 4.0000 mg | Freq: Four times a day (QID) | INTRAMUSCULAR | Status: DC | PRN

## 2014-02-10 MED ORDER — ONDANSETRON HCL 4 MG PO TABS: 4.0000 mg | ORAL_TABLET | Freq: Four times a day (QID) | ORAL | Status: DC | PRN

## 2014-02-10 MED ORDER — OXYCODONE HCL 5 MG/5ML PO SOLN: 5.0000 mg | Freq: Once | ORAL | Status: DC | PRN

## 2014-02-10 MED ORDER — LIDOCAINE HCL 1 % IJ SOLN
INTRAMUSCULAR | Status: DC | PRN
  Administered 2014-02-10: 30 mL

## 2014-02-10 MED ORDER — MIDAZOLAM HCL 2 MG/2ML IJ SOLN
INTRAMUSCULAR | Status: AC
  Filled 2014-02-10: qty 2

## 2014-02-10 MED ORDER — HYDROMORPHONE HCL PF 1 MG/ML IJ SOLN
INTRAMUSCULAR | Status: AC
  Administered 2014-02-10: 0.5 mg via INTRAVENOUS
  Filled 2014-02-10: qty 1

## 2014-02-10 MED ORDER — HYDROMORPHONE HCL PF 1 MG/ML IJ SOLN
INTRAMUSCULAR | Status: DC | PRN
Start: 2014-02-10 — End: 2014-02-11
  Administered 2014-02-10: 1 mg via INTRAVENOUS

## 2014-02-10 MED ORDER — LIDOCAINE HCL (CARDIAC) 20 MG/ML IV SOLN
INTRAVENOUS | Status: DC | PRN
  Administered 2014-02-10: 50 mg via INTRAVENOUS

## 2014-02-10 MED ORDER — IBUPROFEN 600 MG PO TABS: 600.0000 mg | ORAL_TABLET | Freq: Four times a day (QID) | ORAL | Status: DC | PRN

## 2014-02-10 MED ORDER — LACTATED RINGERS IV SOLN
INTRAVENOUS | Status: DC | PRN
  Administered 2014-02-10: 12:00:00 via INTRAVENOUS

## 2014-02-10 MED ORDER — OXYCODONE HCL 5 MG PO TABS: 5.0000 mg | ORAL_TABLET | Freq: Once | ORAL | Status: DC | PRN

## 2014-02-10 MED ORDER — ENOXAPARIN SODIUM 40 MG/0.4ML ~~LOC~~ SOLN
40.0000 mg | SUBCUTANEOUS | Status: DC
  Administered 2014-02-11: 40 mg via SUBCUTANEOUS
  Filled 2014-02-10 (×2): qty 0.4

## 2014-02-10 MED ORDER — MORPHINE SULFATE 2 MG/ML IJ SOLN: 2.0000 mg | INTRAMUSCULAR | Status: DC | PRN

## 2014-02-10 MED ORDER — 0.9 % SODIUM CHLORIDE (POUR BTL) OPTIME
TOPICAL | Status: DC | PRN
  Administered 2014-02-10: 1000 mL

## 2014-02-10 MED ORDER — MIDAZOLAM HCL 5 MG/5ML IJ SOLN
INTRAMUSCULAR | Status: DC | PRN
  Administered 2014-02-10: 2 mg via INTRAVENOUS

## 2014-02-10 MED ORDER — CLINDAMYCIN PHOSPHATE 600 MG/50ML IV SOLN
600.0000 mg | Freq: Once | INTRAVENOUS | Status: AC
  Administered 2014-02-10: 600 mg via INTRAVENOUS
  Filled 2014-02-10: qty 50

## 2014-02-10 MED ORDER — FENTANYL CITRATE 0.05 MG/ML IJ SOLN
INTRAMUSCULAR | Status: AC
  Filled 2014-02-10: qty 5

## 2014-02-10 MED ORDER — DEXTROSE 5 % IV SOLN
2.0000 g | INTRAVENOUS | Status: AC
  Administered 2014-02-10: 2 g via INTRAVENOUS
  Filled 2014-02-10 (×2): qty 2

## 2014-02-10 MED ORDER — DEXTROSE 5 % IV SOLN
2.0000 g | Freq: Three times a day (TID) | INTRAVENOUS | Status: DC
  Administered 2014-02-10 – 2014-02-11 (×2): 2 g via INTRAVENOUS
  Filled 2014-02-10 (×4): qty 2

## 2014-02-10 MED ORDER — ONDANSETRON HCL 4 MG/2ML IJ SOLN
INTRAMUSCULAR | Status: DC | PRN
  Administered 2014-02-10: 4 mg via INTRAVENOUS

## 2014-02-10 MED ORDER — SUCCINYLCHOLINE CHLORIDE 20 MG/ML IJ SOLN
INTRAMUSCULAR | Status: DC | PRN
  Administered 2014-02-10: 100 mg via INTRAVENOUS

## 2014-02-10 MED ORDER — OXYCODONE-ACETAMINOPHEN 5-325 MG PO TABS
ORAL_TABLET | ORAL | Status: AC
  Administered 2014-02-10: 2 via ORAL
  Filled 2014-02-10: qty 2

## 2014-02-10 MED ORDER — OXYCODONE-ACETAMINOPHEN 5-325 MG PO TABS
1.0000 | ORAL_TABLET | ORAL | Status: DC | PRN
  Administered 2014-02-10: 1 via ORAL
  Administered 2014-02-10 – 2014-02-11 (×3): 2 via ORAL
  Filled 2014-02-10 (×3): qty 2

## 2014-02-10 MED ORDER — ARTIFICIAL TEARS OP OINT
TOPICAL_OINTMENT | OPHTHALMIC | Status: AC
  Filled 2014-02-10: qty 3.5

## 2014-02-10 MED ORDER — PROPOFOL 10 MG/ML IV BOLUS
INTRAVENOUS | Status: DC | PRN
  Administered 2014-02-10: 200 mg via INTRAVENOUS

## 2014-02-10 MED ORDER — SODIUM CHLORIDE 0.9 % IV BOLUS (SEPSIS)
1000.0000 mL | Freq: Once | INTRAVENOUS | Status: AC
  Administered 2014-02-10: 1000 mL via INTRAVENOUS

## 2014-02-10 MED ORDER — SODIUM CHLORIDE 0.9 % IV BOLUS (SEPSIS): 1000.0000 mL | Freq: Once | INTRAVENOUS | Status: DC

## 2014-02-10 MED ORDER — BUPIVACAINE-EPINEPHRINE (PF) 0.5% -1:200000 IJ SOLN
INTRAMUSCULAR | Status: DC | PRN
  Administered 2014-02-10: 30 mL via PERINEURAL

## 2014-02-10 MED ORDER — ROCURONIUM BROMIDE 50 MG/5ML IV SOLN
INTRAVENOUS | Status: AC
  Filled 2014-02-10: qty 1

## 2014-02-10 MED ORDER — LIDOCAINE HCL (PF) 1 % IJ SOLN
INTRAMUSCULAR | Status: AC
  Filled 2014-02-10: qty 30

## 2014-02-10 MED ORDER — LEVOTHYROXINE SODIUM 175 MCG PO TABS
175.0000 ug | ORAL_TABLET | Freq: Every day | ORAL | Status: DC
  Administered 2014-02-11: 175 ug via ORAL
  Filled 2014-02-10 (×2): qty 1

## 2014-02-10 MED ORDER — FENTANYL CITRATE 0.05 MG/ML IJ SOLN
INTRAMUSCULAR | Status: DC | PRN
  Administered 2014-02-10: 150 ug via INTRAVENOUS
  Administered 2014-02-10 (×2): 50 ug via INTRAVENOUS

## 2014-02-10 SURGICAL SUPPLY — 42 items
BANDAGE GAUZE ELAST BULKY 4 IN (GAUZE/BANDAGES/DRESSINGS) ×2 IMPLANT
CANISTER SUCTION 2500CC (MISCELLANEOUS) ×3 IMPLANT
COVER SURGICAL LIGHT HANDLE (MISCELLANEOUS) ×3 IMPLANT
DRAIN PENROSE 1/4X12 LTX STRL (WOUND CARE) ×2 IMPLANT
DRAPE UTILITY 15X26 W/TAPE STR (DRAPE) ×6 IMPLANT
DRSG PAD ABDOMINAL 8X10 ST (GAUZE/BANDAGES/DRESSINGS) ×3 IMPLANT
ELECT CAUTERY BLADE 6.4 (BLADE) IMPLANT
ELECT REM PT RETURN 9FT ADLT (ELECTROSURGICAL)
ELECTRODE REM PT RTRN 9FT ADLT (ELECTROSURGICAL) IMPLANT
GLOVE BIO SURGEON STRL SZ 6.5 (GLOVE) ×3 IMPLANT
GLOVE BIO SURGEONS STRL SZ 6.5 (GLOVE) ×2
GLOVE BIOGEL PI IND STRL 6.5 (GLOVE) IMPLANT
GLOVE BIOGEL PI IND STRL 7.0 (GLOVE) ×1 IMPLANT
GLOVE BIOGEL PI INDICATOR 6.5 (GLOVE) ×2
GLOVE BIOGEL PI INDICATOR 7.0 (GLOVE) ×2
GOWN STRL REUS W/ TWL LRG LVL3 (GOWN DISPOSABLE) ×1 IMPLANT
GOWN STRL REUS W/TWL 2XL LVL3 (GOWN DISPOSABLE) ×3 IMPLANT
GOWN STRL REUS W/TWL LRG LVL3 (GOWN DISPOSABLE) ×6
KIT BASIN OR (CUSTOM PROCEDURE TRAY) ×3 IMPLANT
KIT ROOM TURNOVER OR (KITS) ×3 IMPLANT
NDL SPNL 20GX3.5 QUINCKE YW (NEEDLE) ×1 IMPLANT
NEEDLE SPNL 20GX3.5 QUINCKE YW (NEEDLE) ×3 IMPLANT
NS IRRIG 1000ML POUR BTL (IV SOLUTION) ×3 IMPLANT
PACK LITHOTOMY IV (CUSTOM PROCEDURE TRAY) IMPLANT
PACK SURGICAL SETUP 50X90 (CUSTOM PROCEDURE TRAY) IMPLANT
PAD ABD 8X10 STRL (GAUZE/BANDAGES/DRESSINGS) ×2 IMPLANT
PAD ARMBOARD 7.5X6 YLW CONV (MISCELLANEOUS) ×3 IMPLANT
PENCIL BUTTON HOLSTER BLD 10FT (ELECTRODE) ×3 IMPLANT
SPONGE GAUZE 4X4 12PLY (GAUZE/BANDAGES/DRESSINGS) ×3 IMPLANT
SPONGE LAP 18X18 X RAY DECT (DISPOSABLE) ×3 IMPLANT
SURGILUBE 2OZ TUBE FLIPTOP (MISCELLANEOUS) ×3 IMPLANT
SUT ETHIBOND NAB CT1 #1 30IN (SUTURE) ×2 IMPLANT
SWAB COLLECTION DEVICE MRSA (MISCELLANEOUS) IMPLANT
SYR CONTROL 10ML LL (SYRINGE) ×3 IMPLANT
TOWEL OR 17X24 6PK STRL BLUE (TOWEL DISPOSABLE) ×3 IMPLANT
TOWEL OR 17X26 10 PK STRL BLUE (TOWEL DISPOSABLE) ×3 IMPLANT
TUBE ANAEROBIC SPECIMEN COL (MISCELLANEOUS) IMPLANT
TUBE CONNECTING 12'X1/4 (SUCTIONS) ×1
TUBE CONNECTING 12X1/4 (SUCTIONS) ×2 IMPLANT
UNDERPAD 30X30 INCONTINENT (UNDERPADS AND DIAPERS) ×3 IMPLANT
WATER STERILE IRR 1000ML POUR (IV SOLUTION) IMPLANT
YANKAUER SUCT BULB TIP NO VENT (SUCTIONS) ×3 IMPLANT

## 2014-02-10 NOTE — ED Notes (Signed)
Transferred to OR,

## 2014-02-10 NOTE — Anesthesia Preprocedure Evaluation (Addendum)
Anesthesia Evaluation  Patient identified by MRN, date of birth, ID band Patient awake    Reviewed: Allergy & Precautions, H&P , NPO status , Patient's Chart, lab work & pertinent test results, reviewed documented beta blocker date and time   Airway Mallampati: II TM Distance: >3 FB Neck ROM: Full    Dental no notable dental hx. (+) Edentulous Upper, Edentulous Lower, Dental Advisory Given   Pulmonary Current Smoker,  breath sounds clear to auscultation  Pulmonary exam normal       Cardiovascular negative cardio ROS  Rhythm:Regular Rate:Normal     Neuro/Psych negative neurological ROS  negative psych ROS   GI/Hepatic negative GI ROS, Neg liver ROS,   Endo/Other  Hypothyroidism   Renal/GU negative Renal ROS  negative genitourinary   Musculoskeletal negative musculoskeletal ROS (+)   Abdominal (+)  Abdomen: soft. Bowel sounds: normal.  Peds  Hematology negative hematology ROS (+)   Anesthesia Other Findings   Reproductive/Obstetrics negative OB ROS                        Anesthesia Physical Anesthesia Plan  ASA: II  Anesthesia Plan: General   Post-op Pain Management:    Induction: Intravenous  Airway Management Planned: LMA and Oral ETT  Additional Equipment:   Intra-op Plan:   Post-operative Plan: Extubation in OR  Informed Consent: I have reviewed the patients History and Physical, chart, labs and discussed the procedure including the risks, benefits and alternatives for the proposed anesthesia with the patient or authorized representative who has indicated his/her understanding and acceptance.   Dental advisory given  Plan Discussed with: CRNA  Anesthesia Plan Comments:         Anesthesia Quick Evaluation

## 2014-02-10 NOTE — Op Note (Addendum)
02/10/2014  1:04 PM  PATIENT:  Todd Elliott  58 y.o. male  Patient Care Team: Provider Not In System as PCP - General  PRE-OPERATIVE DIAGNOSIS:  perirectal abcess  POST-OPERATIVE DIAGNOSIS:  perirectal abcess  PROCEDURE:  IRRIGATION AND DRAINAGE PERIRECTAL ABSCESS  SURGEON:  Surgeon(s): [name], MD  ASSISTANT: none   ANESTHESIA:   general  SPECIMEN:  No Specimen  DISPOSITION OF SPECIMEN: N/A  COUNTS:  YES  PLAN OF CARE: Admit for overnight observation  PATIENT DISPOSITION:  PACU - hemodynamically stable.  INDICATION: This is a 58 y.o. M s/p recent Lannette Donath procedure for horseshoe abscess.  He presents with worsening pain and elevated WBC. He has an obvious recurrence of his abscess on exam.     OR FINDINGS: Large L lateral abscess.  Draining under penrose drain.  DESCRIPTION: the patient was identified in the preoperative holding area and taken to the OR where they were laid on the operating room table.  General anesthesia was induced without difficulty. The patient was then positioned in prone jackknife position with buttocks gently taped apart.  The patient was then prepped and draped in usual sterile fashion.  SCDs were noted to be in place prior to the initiation of anesthesia. A surgical timeout was performed indicating the correct patient, procedure, positioning and need for preoperative antibiotics.  I began with a digital rectal exam.  The posterior lesion had almost closed.  I then placed a Hill-Ferguson anoscope into the anal canal and evaluated this completely.  Purulence was expressed through the proximal aspect of this wound.  I opened the left lateral incision to unroof the cavity.  I evacuated the purulence.  I replaced the penrose in the deeper tract.  This was sutured into place.  The wound was then packed with a Kerlex roll.  All counts were correct per OR staff.  The patient was awakened and sent to PACU in stable condition.

## 2014-02-10 NOTE — ED Notes (Signed)
Pt had a rectal fistula surgery done 2 weeks ago and drained left in.  Pt is now with increased swelling to left buttock and a lot of pain.

## 2014-02-10 NOTE — ED Provider Notes (Signed)
CSN: [id number]     Arrival date & time 02/10/14  8250 History   First MD Initiated Contact with Patient 02/10/14 0536     Chief Complaint  Patient presents with  . Wound Check    Rectal Horseshow Fistula and tube placed     (Consider location/radiation/quality/duration/timing/severity/associated sxs/prior Treatment) HPI This patient is a pleasant 58 yo man who is s/p anal fistulotomy with drain placement by Dr. [name] on 5/39/76. The patient has been going well post operatively until 2 days ago when the drain stopped draining, per patient. Since then, he has developed increasing pain over the inferior aspect of the left buttock with swelling. Pain is 10/10. Patient denies fever.   His pain radiates to the perineum. It is worse in the supine position. No fever. PO intake has been wnl. No nausea or vomiting or abdominal pain.   Past Medical History  Diagnosis Date  . Hemorrhoid   . Thyroid disease   . Hypothyroidism    Past Surgical History  Procedure Laterality Date  . Arm surgery Right   . Appendectomy    . Abdominal surgery    . Gastric resection    . Hernia repair Right   . Cholecystectomy    . Incision and drainage perirectal abscess Left 12/27/2013    Procedure: IRRIGATION AND DEBRIDEMENT PERIRECTAL ABSCESS;  Surgeon: Edward Jolly, MD;  Location: [facility];  Service: General;  Laterality: Left;  . Colonoscopy    . Colon surgery    . Examination under anesthesia N/A 01/30/2014    Procedure: EXAM UNDER ANESTHESIA;  Surgeon: [name], MD;  Location: [facility];  Service: General;  Laterality: N/A;  . Anal fistulotomy N/A 01/30/2014    Procedure: ANAL FISTULOTOMY WITH DRAIN PLACEMENT;  Surgeon: [name], MD;  Location: [facility];  Service: General;  Laterality: N/A;   Family History  Problem Relation Age of Onset  . Colon cancer Father 82  . Cancer Father     colon  . Diabetes Maternal Aunt     x2  . Diabetes Maternal  Uncle   . Diabetes      Mat great GM  . Heart disease Maternal Grandfather   . Cancer Maternal Grandfather     lymphoma   History  Substance Use Topics  . Smoking status: Light Tobacco Smoker -- 35 years    Types: Cigarettes  . Smokeless tobacco: Never Used  . Alcohol Use: Yes     Comment: 3 x a week    Review of Systems Ten point review of symptoms performed and is negative with the exception of symptoms noted above.     Allergies  Review of patient's allergies indicates no known allergies.  Home Medications   Prior to Admission medications   Medication Sig Start Date End Date Taking? Authorizing Provider  docusate sodium (COLACE) 100 MG capsule Take 1 capsule (100 mg total) by mouth 2 (two) times daily. 7/34/19  Yes [name], MD  levothyroxine (SYNTHROID, LEVOTHROID) 175 MCG tablet Take 175 mcg by mouth daily before breakfast.   Yes Historical Provider, MD  Multiple Vitamins-Minerals (CENTRUM SILVER PO) Take 1 tablet by mouth daily.   Yes Historical Provider, MD  Pyridoxine HCl (B-6 PO) Take 1 tablet by mouth daily.   Yes Historical Provider, MD  traMADol (ULTRAM) 50 MG tablet Take 50 mg by mouth every 6 (six) hours as needed for moderate pain.   Yes Historical Provider, MD  oxyCODONE-acetaminophen (ROXICET) 5-325  MG per tablet Take 1-2 tablets by mouth every 4 (four) hours as needed for severe pain. 3/84/66   [name], MD   BP 127/77  Pulse 88  Temp(Src) 99.5 F (37.5 C) (Oral)  Resp 20  SpO2 98% Physical Exam Gen: well developed and well nourished appearing Head: NCAT Eyes: PERL, EOMI Nose: no epistaixis or rhinorrhea Mouth/throat: mucosa is moist and pink Neck: supple, no stridor Lungs: CTA B, no wheezing, rhonchi or rales CV: RRR, no murmur, extremities appear well perfused.  Abd: soft, notender, nondistended Rectal exam: there is a drain sewn in place in the perianal region, the inferior half of the left gluteal region is erythematous, fluctuant,  erythematous. There is very subtle extension of these findings into the left perineal region.  Back: no ttp, no cva ttp Skin: warm and dry Ext: normal to inspection, no dependent edema Neuro: CN ii-xii grossly intact, no focal deficits Psyche; normal affect,  calm and cooperative.   ED Course  Procedures (including critical care time) Labs Review  Results for orders placed during the hospital encounter of 02/10/14 (from the past 24 hour(s))  CBC     Status: Abnormal   Collection Time    02/10/14  6:12 AM      Result Value Ref Range   WBC 17.7 (*) 4.0 - 10.5 K/uL   RBC 4.91  4.22 - 5.81 MIL/uL   Hemoglobin 14.6  13.0 - 17.0 g/dL   HCT 43.7  39.0 - 52.0 %   MCV 89.0  78.0 - 100.0 fL   MCH 29.7  26.0 - 34.0 pg   MCHC 33.4  30.0 - 36.0 g/dL   RDW 12.9  11.5 - 15.5 %   Platelets 210  150 - 400 K/uL  BASIC METABOLIC PANEL     Status: Abnormal   Collection Time    02/10/14  6:12 AM      Result Value Ref Range   Sodium 135 (*) 137 - 147 mEq/L   Potassium 3.6 (*) 3.7 - 5.3 mEq/L   Chloride 97  96 - 112 mEq/L   CO2 23  19 - 32 mEq/L   Glucose, Bld 119 (*) 70 - 99 mg/dL   BUN 10  6 - 23 mg/dL   Creatinine, Ser 0.83  0.50 - 1.35 mg/dL   Calcium 9.3  8.4 - 10.5 mg/dL   GFR calc non Af Amer >90  >90 mL/min   GFR calc Af Amer >90  >90 mL/min     MDM   Final diagnoses:  Perirectal abscess  with sepsis.   We are fluid resuscitating and treating empirically with IV Clindamycin. Patient discussed with Dr. Redmond Pulling at approximately 0600 and consultation requested. He reports that Dr. Redmond Pulling will see and evaluate the patient in the ED around 0730. He agrees with ED management. The patient will require admission.     Elyn Peers, MD 02/10/14 [phone]

## 2014-02-10 NOTE — H&P (Signed)
Todd Elliott is an 58 y.o. male.   Chief Complaint: perirectal pain HPI: This is a 58 y.o. M with a horseshow abscess that is well know to me.  He is ~2 weeks s/p Lannette Donath procedure for his fistula.  He reports ~24h h/o worsening perirectal pain and pressure and lack of drainage from his surgical site in that time period.  This has been associated with subjective fevers and chills.  He denies nausea or vomiting.  He is having regular BM's.  Past Medical History  Diagnosis Date  . Hemorrhoid   . Thyroid disease   . Hypothyroidism     Past Surgical History  Procedure Laterality Date  . Arm surgery Right   . Appendectomy    . Abdominal surgery    . Gastric resection    . Hernia repair Right   . Cholecystectomy    . Incision and drainage perirectal abscess Left 12/27/2013    Procedure: IRRIGATION AND DEBRIDEMENT PERIRECTAL ABSCESS;  Surgeon: Edward Jolly, MD;  Location: [facility];  Service: General;  Laterality: Left;  . Colonoscopy    . Colon surgery    . Examination under anesthesia N/A 01/30/2014    Procedure: EXAM UNDER ANESTHESIA;  Surgeon: [name], MD;  Location: [facility];  Service: General;  Laterality: N/A;  . Anal fistulotomy N/A 01/30/2014    Procedure: ANAL FISTULOTOMY WITH DRAIN PLACEMENT;  Surgeon: [name], MD;  Location: [facility];  Service: General;  Laterality: N/A;    Family History  Problem Relation Age of Onset  . Colon cancer Father 42  . Cancer Father     colon  . Diabetes Maternal Aunt     x2  . Diabetes Maternal Uncle   . Diabetes      Mat great GM  . Heart disease Maternal Grandfather   . Cancer Maternal Grandfather     lymphoma   Social History:  reports that he has been smoking Cigarettes.  He has been smoking about 0.00 packs per day for the past 35 years. He has never used smokeless tobacco. He reports that he drinks alcohol. He reports that he does not use illicit drugs.  Allergies: No Known  Allergies   (Not in a hospital admission)  Results for orders placed during the hospital encounter of 02/10/14 (from the past 48 hour(s))  CBC     Status: Abnormal   Collection Time    02/10/14  6:12 AM      Result Value Ref Range   WBC 17.7 (*) 4.0 - 10.5 K/uL   RBC 4.91  4.22 - 5.81 MIL/uL   Hemoglobin 14.6  13.0 - 17.0 g/dL   HCT 43.7  39.0 - 52.0 %   MCV 89.0  78.0 - 100.0 fL   MCH 29.7  26.0 - 34.0 pg   MCHC 33.4  30.0 - 36.0 g/dL   RDW 12.9  11.5 - 15.5 %   Platelets 210  150 - 400 K/uL  BASIC METABOLIC PANEL     Status: Abnormal   Collection Time    02/10/14  6:12 AM      Result Value Ref Range   Sodium 135 (*) 137 - 147 mEq/L   Potassium 3.6 (*) 3.7 - 5.3 mEq/L   Chloride 97  96 - 112 mEq/L   CO2 23  19 - 32 mEq/L   Glucose, Bld 119 (*) 70 - 99 mg/dL   BUN 10  6 - 23 mg/dL  Creatinine, Ser 0.83  0.50 - 1.35 mg/dL   Calcium 9.3  8.4 - 10.5 mg/dL   GFR calc non Af Amer >90  >90 mL/min   GFR calc Af Amer >90  >90 mL/min   Comment: (NOTE)     The eGFR has been calculated using the CKD EPI equation.     This calculation has not been validated in all clinical situations.     eGFR's persistently <90 mL/min signify possible Chronic Kidney     Disease.  I-STAT CG4 LACTIC ACID, ED     Status: None   Collection Time    02/10/14  8:45 AM      Result Value Ref Range   Lactic Acid, Venous 0.85  0.5 - 2.2 mmol/L   No results found.  Review of Systems  Constitutional: Positive for fever and chills.  Eyes: Negative for blurred vision.  Respiratory: Negative for cough and shortness of breath.   Cardiovascular: Negative for chest pain.  Gastrointestinal: Negative for nausea, vomiting, abdominal pain, diarrhea, constipation and blood in stool.  Genitourinary: Negative for dysuria, urgency and frequency.  Musculoskeletal: Negative for myalgias.  Neurological: Negative for headaches.    Blood pressure 149/100, pulse 89, temperature 99.5 F (37.5 C), temperature source  Oral, resp. rate 18, SpO2 99.00%. Physical Exam  Constitutional: He is oriented to person, place, and time. He appears well-developed and well-nourished.  HENT:  Head: Normocephalic and atraumatic.  Eyes: Conjunctivae are normal. Pupils are equal, round, and reactive to light.  Neck: Normal range of motion.  Cardiovascular: Normal rate and regular rhythm.   Respiratory: Effort normal and breath sounds normal.  GI: Soft. Bowel sounds are normal. He exhibits no distension. There is no tenderness.  L perirectal abscess  Musculoskeletal: Normal range of motion.  Neurological: He is alert and oriented to person, place, and time.  Skin: Skin is warm and dry.     Assessment/Plan This patient appears to be developing another abscess at his lateral incision.  I have recommended debridement and drainage in the OR.  Risks include bleeding, recurrence and pain.    Todd Elliott, Todd C. 8/85/0277, 41:28 AM

## 2014-02-10 NOTE — Anesthesia Postprocedure Evaluation (Signed)
  Anesthesia Post-op Note  Patient: Ector Laurel Moder  Procedure(s) Performed: Procedure(s): IRRIGATION AND DEBRIDEMENT PERIRECTAL ABSCESS (N/A)  Patient Location: PACU  Anesthesia Type:General  Level of Consciousness: awake and alert   Airway and Oxygen Therapy: Patient Spontanous Breathing  Post-op Pain: none  Post-op Assessment: Post-op Vital signs reviewed, Patient's Cardiovascular Status Stable and Respiratory Function Stable  Post-op Vital Signs: Reviewed  Filed Vitals:   02/10/14 1355  BP: 103/61  Pulse: 86  Temp: 37.1 C  Resp: 11    Complications: No apparent anesthesia complications

## 2014-02-11 LAB — CBC
HCT: 40.6 % (ref 39.0–52.0)
HEMOGLOBIN: 12.9 g/dL — AB (ref 13.0–17.0)
MCH: 29 pg (ref 26.0–34.0)
MCHC: 31.8 g/dL (ref 30.0–36.0)
MCV: 91.2 fL (ref 78.0–100.0)
PLATELETS: 201 10*3/uL (ref 150–400)
RBC: 4.45 MIL/uL (ref 4.22–5.81)
RDW: 13.1 % (ref 11.5–15.5)
WBC: 14.1 10*3/uL — AB (ref 4.0–10.5)

## 2014-02-11 MED ORDER — OXYCODONE-ACETAMINOPHEN 5-325 MG PO TABS: 1.0000 | ORAL_TABLET | ORAL | Status: DC | PRN

## 2014-02-11 MED ORDER — AMOXICILLIN-POT CLAVULANATE 875-125 MG PO TABS: 1.0000 | ORAL_TABLET | Freq: Two times a day (BID) | ORAL | Status: DC

## 2014-02-11 MED ORDER — TRAMADOL HCL 50 MG PO TABS: 50.0000 mg | ORAL_TABLET | Freq: Four times a day (QID) | ORAL | Status: DC | PRN

## 2014-02-11 NOTE — Transfer of Care (Signed)
Immediate Anesthesia Transfer of Care Note  Patient: Todd Elliott  Procedure(s) Performed: Procedure(s): IRRIGATION AND DEBRIDEMENT PERIRECTAL ABSCESS (N/A)  Patient Location: PACU  Anesthesia Type:General  Level of Consciousness: awake, alert  and oriented  Airway & Oxygen Therapy: Patient Spontanous Breathing and Patient connected to nasal cannula oxygen  Post-op Assessment: Report given to PACU RN  Post vital signs: Reviewed and stable  Complications: No apparent anesthesia complications

## 2014-02-11 NOTE — Discharge Instructions (Signed)
ANORECTAL SURGERY: POST OP INSTRUCTIONS 1. Take your usually prescribed home medications unless otherwise directed. 2. DIET: During the first few hours after surgery sip on some liquids until you are able to urinate.  It is normal to not urinate for several hours after this surgery.  If you feel uncomfortable, please contact the office for instructions.  After you are able to urinate,you may eat, if you feel like it.  Follow a light bland diet the first 24 hours after arrival home, such as soup, liquids, crackers, etc.  Be sure to include lots of fluids daily (6-8 glasses).  Avoid fast food or heavy meals, as your are more likely to get nauseated.  Eat a low fat diet the next few days after surgery.  Limit caffeine intake to 1-2 servings a day. 3. PAIN CONTROL: a. Pain is best controlled by a usual combination of several different methods TOGETHER: i. Muscle relaxation 1.  Soak in a warm bath (or Sitz bath) four times a day and after bowel movements.  Continue to do this until all pain is resolved. ii. Over the counter pain medication iii. Prescription pain medication b. Most patients will experience some swelling and discomfort in the anus/rectal area and incisions.  Heat such as warm towels, sitz baths, warm baths, etc to help relax tight/sore spots and speed recovery.  Some people prefer to use ice, especially in the first couple days after surgery, as it may decrease the pain and swelling, or alternate between ice & heat.  Experiment to what works for you.  Swelling and bruising can take several weeks to resolve.  Pain can take even longer to completely resolve. c. It is helpful to take an over-the-counter pain medication regularly for the first few weeks.  Choose one of the following that works best for you: i. Naproxen (Aleve, etc)  Two 220mg  tabs twice a day ii. Ibuprofen (Advil, etc) Three 200mg  tabs four times a day (every meal & bedtime) d. A  prescription for pain medication (such as  percocet, oxycodone, hydrocodone, etc) should be given to you upon discharge.  Take your pain medication as prescribed.  i. If you are having problems/concerns with the prescription medicine (does not control pain, nausea, vomiting, rash, itching, etc), please call us [phone] to see if we need to switch you to a different pain medicine that will work better for you and/or control your side effect better. ii. If you need a refill on your pain medication, please contact your pharmacy.  They will contact our office to request authorization. Prescriptions will not be filled after 5 pm or on week-ends. 4. KEEP YOUR BOWELS REGULAR and AVOID CONSTIPATION a. The goal is one to two soft bowel movements a day.  You should at least have a bowel movement every other day. b. Avoid getting constipated.  Between the surgery and the pain medications, it is common to experience some constipation. This can be very painful after rectal surgery.  Increasing fluid intake and taking a fiber supplement (such as Metamucil, Citrucel, FiberCon, etc) 1-2 times a day regularly will usually help prevent this problem from occurring.  A stool softener like colace is also recommended.  This can be purchased over the counter at your pharmacy.  You can take it up to 3 times a day.  If you do not have a bowel movement after 24 hrs since your surgery, take one does of milk of magnesia.  If you still haven't had a bowel movement 8-12  hours after that dose, take another dose.  If you don't have a bowel movement 48 hrs after surgery, purchase a Fleets enema from the drug store and administer gently per package instructions.  If you still are having trouble with your bowel movements after that, please call the office for further instructions. °c. If you develop diarrhea or have many loose bowel movements, simplify your diet to bland foods & liquids for a few days.  Stop any stool softeners and decrease your fiber supplement.  Switching to mild  anti-diarrheal medications (Kayopectate, Pepto Bismol) can help.  If this worsens or does not improve, please call us. ° °5. Wound Care °a. Remove your bandages before your first bowel movement or 8 hours after surgery.     °b. Remove any wound packing material at this tim,e as well.  You do not need to repack the wound unless instructed otherwise.  Wear an absorbent pad or soft cotton gauze in your underwear to catch any drainage and help keep the area clean. You should change this every 2-3 hours while awake. °c. Keep the area clean and dry.  Bathe / shower every day, especially after bowel movements.  Keep the area clean by showering / bathing over the incision / wound.   It is okay to soak an open wound to help wash it.  Wet wipes or showers / gentle washing after bowel movements is often less traumatic than regular toilet paper. °d. You may have some styrofoam-like soft packing in the rectum which will come out with the first bowel movement.  °e. You will often notice bleeding with bowel movements.  This should slow down by the end of the first week of surgery °f. Expect some drainage.  This should slow down, too, by the end of the first week of surgery.  Wear an absorbent pad or soft cotton gauze in your underwear until the drainage stops. °g. Do Not sit on a rubber or pillow ring.  This can make you symptoms worse.  You may sit on a soft pillow if needed.  °6. ACTIVITIES as tolerated:   °a. You may resume regular (light) daily activities beginning the next day--such as daily self-care, walking, climbing stairs--gradually increasing activities as tolerated.  If you can walk 30 minutes without difficulty, it is safe to try more intense activity such as jogging, treadmill, bicycling, low-impact aerobics, swimming, etc. °b. Save the most intensive and strenuous activity for last such as sit-ups, heavy lifting, contact sports, etc  Refrain from any heavy lifting or straining until you are off narcotics for pain  control.   °c. You may drive when you are no longer taking prescription pain medication, you can comfortably sit for long periods of time, and you can safely maneuver your car and apply brakes. °d. You may have sexual intercourse when it is comfortable.  °7. FOLLOW UP in our office °a. Please call [facility] at [phone] to set up an appointment to see your surgeon in the office for a follow-up appointment approximately 3-4 weeks after your surgery. °b. Make sure that you call for this appointment the day you arrive home to insure a convenient appointment time. °10. IF YOU HAVE DISABILITY OR FAMILY LEAVE FORMS, BRING THEM TO THE OFFICE FOR PROCESSING.  DO NOT GIVE THEM TO YOUR DOCTOR. ° ° ° ° °WHEN TO CALL US [phone]: °1. Poor pain control °2. Reactions / problems with new medications (rash/itching, nausea, etc)  °3. Fever over 101.5 F (38.5   C) 4. Inability to urinate 5. Nausea and/or vomiting 6. Worsening swelling or bruising 7. Continued bleeding from incision. 8. Increased pain, redness, or drainage from the incision  The clinic staff is available to answer your questions during regular business hours (8:30am-5pm).  Please dont hesitate to call and ask to speak to one of our nurses for clinical concerns.   A surgeon from [facility] is always on call at the hospitals   If you have a medical emergency, go to the nearest emergency room or call 911.    [facility], [place], [place], [place], [place]  [address] ? MAIN: [phone] ? TOLL FREE: [phone] ? FAX [phone] [email or web address]

## 2014-02-11 NOTE — Discharge Summary (Signed)
Physician Discharge Summary  Patient ID: KEDRICK MCNAMEE MRN: [id number] DOB/AGE: 03-11-1956 58 y.o.  Admit date: 02/10/2014 Discharge date: 02/11/2014  Admission Diagnoses: anal abscess  Discharge Diagnoses:  Active Problems:   Anal abscess   Discharged Condition: stable  Hospital Course: The patient was admitted overnight after I&D of perianal abscess.    Consults: None  Significant Diagnostic Studies: labs: cbc  Treatments: surgery: I&D  Discharge Exam: Blood pressure 121/72, pulse 68, temperature 97.6 F (36.4 C), temperature source Oral, resp. rate 18, SpO2 99.00%. General appearance: NAD Incision/Wound:packing removed, clean, draining well, seton in place   Disposition: 01-Home or Self Care     Medication List         amoxicillin-clavulanate 875-125 MG per tablet  Commonly known as:  AUGMENTIN  Take 1 tablet by mouth 2 (two) times daily.     B-6 PO  Take 1 tablet by mouth daily.     CENTRUM SILVER PO  Take 1 tablet by mouth daily.     docusate sodium 100 MG capsule  Commonly known as:  COLACE  Take 1 capsule (100 mg total) by mouth 2 (two) times daily.     levothyroxine 175 MCG tablet  Commonly known as:  SYNTHROID, LEVOTHROID  Take 175 mcg by mouth daily before breakfast.     oxyCODONE-acetaminophen 5-325 MG per tablet  Commonly known as:  ROXICET  Take 1-2 tablets by mouth every 4 (four) hours as needed for severe pain.     traMADol 50 MG tablet  Commonly known as:  ULTRAM  Take 1 tablet (50 mg total) by mouth every 6 (six) hours as needed for moderate pain.           Follow-up Information   Follow up with Rosario Adie., MD. Schedule an appointment as soon as possible for a visit in 2 weeks.   Specialty:  General Surgery   Contact information:   [address] [place] [address] [phone]       Signed: Rosario Adie 7/79/3903, 0:09 AM

## 2014-02-11 NOTE — Discharge Planning (Signed)
Patient discharged home in stable condition. Verbalizes understanding of all discharge instructions, including home medications and follow up appointments. 

## 2014-02-12 ENCOUNTER — Encounter (HOSPITAL_COMMUNITY): Payer: Self-pay | Admitting: General Surgery

## 2014-02-12 ENCOUNTER — Telehealth (INDEPENDENT_AMBULATORY_CARE_PROVIDER_SITE_OTHER): Payer: Self-pay

## 2014-02-12 NOTE — Telephone Encounter (Signed)
LMOM to let pt know that his appt has been changed from 07/06 to 07/14 per Dr. Marcello Moores needed to see the pt a couple of weeks out from today.

## 2014-02-18 ENCOUNTER — Telehealth (INDEPENDENT_AMBULATORY_CARE_PROVIDER_SITE_OTHER): Payer: Self-pay | Admitting: General Surgery

## 2014-02-18 ENCOUNTER — Encounter (INDEPENDENT_AMBULATORY_CARE_PROVIDER_SITE_OTHER): Payer: BC Managed Care – PPO | Admitting: General Surgery

## 2014-02-18 NOTE — Telephone Encounter (Signed)
Called to check on him.  Pain is controlled.  Area draining appropriately.  Having BM's regularly with stool softeners.  Using sitz baths 3-4 times a day.  He has completed his antibiotics and denies any new fevers.

## 2014-02-26 ENCOUNTER — Encounter (INDEPENDENT_AMBULATORY_CARE_PROVIDER_SITE_OTHER): Payer: Self-pay | Admitting: General Surgery

## 2014-02-26 ENCOUNTER — Ambulatory Visit (INDEPENDENT_AMBULATORY_CARE_PROVIDER_SITE_OTHER): Payer: BC Managed Care – PPO | Admitting: General Surgery

## 2014-02-26 VITALS — BP 123/81 | HR 76 | Temp 98.8°F | Resp 18 | Ht 71.0 in | Wt 217.0 lb

## 2014-02-26 DIAGNOSIS — Z9889 Other specified postprocedural states: Secondary | ICD-10-CM

## 2014-02-26 MED ORDER — OXYCODONE-ACETAMINOPHEN 5-325 MG PO TABS: 1.0000 | ORAL_TABLET | ORAL | Status: DC | PRN

## 2014-02-26 NOTE — Patient Instructions (Signed)
Return to the office in 2 weeks

## 2014-02-26 NOTE — Progress Notes (Signed)
Todd Elliott is a 58 y.o. male who is status post a debridement and drainage of a horseshoe rectal abscess on 6/10 and 6/28.  He is doing ok.  He is still having some drainage.  His pain is better.  He is back at work.  He is using sitz baths 10-12 times a day.    Objective: Filed Vitals:   02/26/14 1159  BP: 123/81  Pulse: 76  Temp: 98.8 F (37.1 C)  Resp: 18    General appearance: alert and cooperative GI: soft, non-tender; bowel sounds normal; no masses,  no organomegaly  Incision: healing well, purulent drainage noted, internal component seems to be scarring in, no fluctuant masses   Assessment: s/p  Patient Active Problem List   Diagnosis Date Noted  . Anal abscess 02/10/2014  . Perirectal abscess 12/27/2013  . Rectal pain 12/26/2013  . Personal history of colonic polyps 12/26/2013  . RECTAL PROLAPSE 03/03/2009  . ABNORMAL FINDINGS GI TRACT 03/03/2009  . ADRENAL MASS, BILATERAL 02/21/2009  . FECAL INCONTINENCE 01/09/2009  . CHANGE IN BOWELS 01/09/2009  . ABDOMINAL PAIN-RLQ 01/09/2009    Plan: Hopefully we can avoid a complete sphincterotomy.  I will see him back in 2 weeks to check on his progress.      Rosario Adie, [facility], [place]   02/26/2014 12:11 PM

## 2014-03-12 ENCOUNTER — Encounter (INDEPENDENT_AMBULATORY_CARE_PROVIDER_SITE_OTHER): Payer: Self-pay | Admitting: General Surgery

## 2014-03-12 ENCOUNTER — Ambulatory Visit (INDEPENDENT_AMBULATORY_CARE_PROVIDER_SITE_OTHER): Payer: BC Managed Care – PPO | Admitting: General Surgery

## 2014-03-12 VITALS — BP 130/74 | HR 77 | Temp 98.0°F | Ht 71.0 in | Wt 219.0 lb

## 2014-03-12 DIAGNOSIS — Z9889 Other specified postprocedural states: Secondary | ICD-10-CM

## 2014-03-12 MED ORDER — OXYCODONE-ACETAMINOPHEN 5-325 MG PO TABS: 1.0000 | ORAL_TABLET | ORAL | Status: DC | PRN

## 2014-03-12 NOTE — Patient Instructions (Signed)
Return to the office in 3 weeks.  Continue current management.

## 2014-03-12 NOTE — Progress Notes (Signed)
Todd Elliott is a 58 y.o. male who is status post a debridement and drainage of a horseshoe rectal abscess on 6/10 and 6/28. He is doing ok. He is still having some drainage. His pain is better. He is back at work. He is using sitz baths 10-12 times a day.  Objective:  Filed Vitals:   03/12/14 1056  BP: 130/74  Pulse: 77  Temp: 98 F (36.7 C)    General appearance: alert and cooperative  GI: soft, non-tender; bowel sounds normal; no masses, no organomegaly  Incision: healing well, purulent drainage noted, internal component seems to be somewhat scarring in, no fluctuant masses   Assessment:  s/p  Patient Active Problem List    Diagnosis  Date Noted   .  Anal abscess  02/10/2014   .  Perirectal abscess  12/27/2013   .  Rectal pain  12/26/2013   .  Personal history of colonic polyps  12/26/2013   .  RECTAL PROLAPSE  03/03/2009   .  ABNORMAL FINDINGS GI TRACT  03/03/2009   .  ADRENAL MASS, BILATERAL  02/21/2009   .  FECAL INCONTINENCE  01/09/2009   .  CHANGE IN BOWELS  01/09/2009   .  ABDOMINAL PAIN-RLQ  01/09/2009    Plan:  Hopefully we can avoid a complete sphincterotomy. I will see him back in 3 weeks to check on his progress.   Rosario Adie, [facility], [place]

## 2014-03-13 ENCOUNTER — Telehealth (INDEPENDENT_AMBULATORY_CARE_PROVIDER_SITE_OTHER): Payer: Self-pay

## 2014-03-13 DIAGNOSIS — Z09 Encounter for follow-up examination after completed treatment for conditions other than malignant neoplasm: Secondary | ICD-10-CM

## 2014-03-13 MED ORDER — TRAMADOL HCL 50 MG PO TABS: 50.0000 mg | ORAL_TABLET | Freq: Four times a day (QID) | ORAL | Status: DC | PRN

## 2014-03-13 NOTE — Telephone Encounter (Signed)
That's fine.  Please refill.  He takes Tramadol during the day and oxycodone at night.  He thought he had a refill on that one.  50-100mg , Disp # 30, 1 refill

## 2014-03-13 NOTE — Addendum Note (Signed)
Addended by: Illene Regulus on: 03/13/2014 04:31 PM   Modules accepted: Orders

## 2014-03-13 NOTE — Telephone Encounter (Signed)
Pt calling in requesting refill on Tramadol even though he just got a rx for Oxycodone 5/325mg  #30 yesterday. The pt said Dr Marcello Moores told him to call the office for a refill on the Tramadol. Please advise.

## 2014-03-13 NOTE — Telephone Encounter (Signed)
Written rx for Ultram 50mg  #30 x 1rf signed by Dr Hassell Done for Dr Marcello Moores. Rx ready at front desk.

## 2014-04-01 ENCOUNTER — Encounter (INDEPENDENT_AMBULATORY_CARE_PROVIDER_SITE_OTHER): Payer: Self-pay | Admitting: General Surgery

## 2014-04-01 ENCOUNTER — Ambulatory Visit (INDEPENDENT_AMBULATORY_CARE_PROVIDER_SITE_OTHER): Payer: BC Managed Care – PPO | Admitting: General Surgery

## 2014-04-01 VITALS — BP 146/90 | HR 82 | Temp 97.5°F | Ht 71.0 in | Wt 224.4 lb

## 2014-04-01 DIAGNOSIS — K610 Anal abscess: Secondary | ICD-10-CM

## 2014-04-01 DIAGNOSIS — K612 Anorectal abscess: Secondary | ICD-10-CM

## 2014-04-01 NOTE — Progress Notes (Signed)
Todd Elliott is a 58 y.o. male who is status post a debridement and drainage of a horseshoe rectal abscess on 6/10 and 6/28. He is doing ok. He is still having some drainage. His pain is better. He is back at work. He is using sitz baths several times a day.  Objective:  BP 146/90  Pulse 82  Temp(Src) 97.5 F (36.4 C) (Temporal)  Ht 5\' 11"  (1.803 m)  Wt 224 lb 6 oz (101.776 kg)  BMI 31.31 kg/m2  General appearance: alert and cooperative  GI: soft, non-tender; bowel sounds normal; no masses, no organomegaly  Incision: healing well, purulent drainage noted, internal component seems to be somewhat softer, no fluctuant masses  Assessment:  s/p  Patient Active Problem List    Diagnosis  Date Noted   .  Anal abscess  02/10/2014   .  Perirectal abscess  12/27/2013   .  Rectal pain  12/26/2013   .  Personal history of colonic polyps  12/26/2013   .  RECTAL PROLAPSE  03/03/2009   .  ABNORMAL FINDINGS GI TRACT  03/03/2009   .  ADRENAL MASS, BILATERAL  02/21/2009   .  FECAL INCONTINENCE  01/09/2009   .  CHANGE IN BOWELS  01/09/2009   .  ABDOMINAL PAIN-RLQ  01/09/2009   Plan:  Hopefully we can avoid a complete sphincterotomy. I will see him back in 4 weeks to check on his progress.

## 2014-04-05 ENCOUNTER — Telehealth (INDEPENDENT_AMBULATORY_CARE_PROVIDER_SITE_OTHER): Payer: Self-pay

## 2014-04-05 ENCOUNTER — Other Ambulatory Visit (INDEPENDENT_AMBULATORY_CARE_PROVIDER_SITE_OTHER): Payer: Self-pay | Admitting: Surgery

## 2014-04-05 DIAGNOSIS — Z9889 Other specified postprocedural states: Secondary | ICD-10-CM

## 2014-04-05 DIAGNOSIS — Z09 Encounter for follow-up examination after completed treatment for conditions other than malignant neoplasm: Secondary | ICD-10-CM

## 2014-04-05 MED ORDER — OXYCODONE-ACETAMINOPHEN 5-325 MG PO TABS: 1.0000 | ORAL_TABLET | ORAL | Status: DC | PRN

## 2014-04-05 MED ORDER — TRAMADOL HCL 50 MG PO TABS: 50.0000 mg | ORAL_TABLET | Freq: Four times a day (QID) | ORAL | Status: DC | PRN

## 2014-04-05 NOTE — Telephone Encounter (Signed)
Pt s/p perirectal abscess on 02/10/14 by Dr Marcello Moores. Pt is calling today to get a refill on his Oxycodone and his Tramadol. Pt rates his pain a 5 out of 10 while taking the mediaction and a 10 out of 10 while not taking meds. Pts last refill was on 03/12/14. Informed pt that Dr Marcello Moores is out of the office however I will send this to one of her partners to review and we will contact him as soon as we receive a response. Pt verbalized understanding.

## 2014-04-05 NOTE — Telephone Encounter (Signed)
LMOM stating that we have a written pain medication rx for Oxycodone 5/325mg  #30 signed by Dr Ninfa Linden for Dr Marcello Moores. Rx ready for p/u at front desk.

## 2014-05-01 ENCOUNTER — Encounter (INDEPENDENT_AMBULATORY_CARE_PROVIDER_SITE_OTHER): Payer: BC Managed Care – PPO | Admitting: General Surgery

## 2014-06-10 ENCOUNTER — Other Ambulatory Visit (INDEPENDENT_AMBULATORY_CARE_PROVIDER_SITE_OTHER): Payer: Self-pay | Admitting: General Surgery

## 2014-06-21 ENCOUNTER — Encounter (HOSPITAL_BASED_OUTPATIENT_CLINIC_OR_DEPARTMENT_OTHER): Payer: Self-pay | Admitting: *Deleted

## 2014-06-24 ENCOUNTER — Encounter (HOSPITAL_BASED_OUTPATIENT_CLINIC_OR_DEPARTMENT_OTHER): Payer: Self-pay | Admitting: *Deleted

## 2014-06-24 NOTE — Progress Notes (Signed)
NPO AFTER MN. ARRIVE AT 0700. NEEDS HG. WILL TAKE SYNTHROID AM DOS W/ SIPS OF WATER.

## 2014-06-25 NOTE — Anesthesia Preprocedure Evaluation (Signed)
Anesthesia Evaluation  Patient identified by MRN, date of birth, ID band Patient awake    Reviewed: Allergy & Precautions, H&P , NPO status , Patient's Chart, lab work & pertinent test results  Airway Mallampati: II  TM Distance: >3 FB Neck ROM: Full    Dental  (+) Edentulous Upper, Edentulous Lower   Pulmonary neg pulmonary ROS, Current Smoker,  breath sounds clear to auscultation  Pulmonary exam normal       Cardiovascular negative cardio ROS  Rhythm:Regular Rate:Normal     Neuro/Psych negative neurological ROS  negative psych ROS   GI/Hepatic negative GI ROS, Neg liver ROS,   Endo/Other  Hypothyroidism   Renal/GU negative Renal ROS  negative genitourinary   Musculoskeletal negative musculoskeletal ROS (+)   Abdominal   Peds negative pediatric ROS (+)  Hematology negative hematology ROS (+)   Anesthesia Other Findings   Reproductive/Obstetrics negative OB ROS                             Anesthesia Physical Anesthesia Plan  ASA: II  Anesthesia Plan: General   Post-op Pain Management:    Induction: Intravenous  Airway Management Planned: Oral ETT and LMA  Additional Equipment:   Intra-op Plan:   Post-operative Plan: Extubation in OR  Informed Consent: I have reviewed the patients History and Physical, chart, labs and discussed the procedure including the risks, benefits and alternatives for the proposed anesthesia with the patient or authorized representative who has indicated his/her understanding and acceptance.   Dental advisory given  Plan Discussed with: CRNA and Surgeon  Anesthesia Plan Comments:         Anesthesia Quick Evaluation

## 2014-06-26 ENCOUNTER — Ambulatory Visit (HOSPITAL_BASED_OUTPATIENT_CLINIC_OR_DEPARTMENT_OTHER): Payer: BC Managed Care – PPO | Admitting: Anesthesiology

## 2014-06-26 ENCOUNTER — Ambulatory Visit (HOSPITAL_BASED_OUTPATIENT_CLINIC_OR_DEPARTMENT_OTHER)
Admission: RE | Admit: 2014-06-26 | Discharge: 2014-06-26 | Disposition: A | Discharge status: Home / Self Care | Payer: BC Managed Care – PPO | Source: Ambulatory Visit | Attending: General Surgery | Admitting: General Surgery

## 2014-06-26 ENCOUNTER — Encounter (HOSPITAL_BASED_OUTPATIENT_CLINIC_OR_DEPARTMENT_OTHER)
Admission: RE | Disposition: A | Discharge status: Home / Self Care | Payer: Self-pay | Source: Ambulatory Visit | Attending: General Surgery

## 2014-06-26 ENCOUNTER — Encounter (HOSPITAL_BASED_OUTPATIENT_CLINIC_OR_DEPARTMENT_OTHER): Payer: Self-pay | Admitting: *Deleted

## 2014-06-26 DIAGNOSIS — Z86010 Personal history of colonic polyps: Secondary | ICD-10-CM | POA: Diagnosis not present

## 2014-06-26 DIAGNOSIS — E039 Hypothyroidism, unspecified: Secondary | ICD-10-CM | POA: Insufficient documentation

## 2014-06-26 DIAGNOSIS — E278 Other specified disorders of adrenal gland: Secondary | ICD-10-CM | POA: Insufficient documentation

## 2014-06-26 DIAGNOSIS — R198 Other specified symptoms and signs involving the digestive system and abdomen: Secondary | ICD-10-CM | POA: Insufficient documentation

## 2014-06-26 DIAGNOSIS — R159 Full incontinence of feces: Secondary | ICD-10-CM | POA: Insufficient documentation

## 2014-06-26 DIAGNOSIS — K611 Rectal abscess: Secondary | ICD-10-CM | POA: Diagnosis not present

## 2014-06-26 DIAGNOSIS — K649 Unspecified hemorrhoids: Secondary | ICD-10-CM | POA: Diagnosis not present

## 2014-06-26 DIAGNOSIS — K603 Anal fistula: Secondary | ICD-10-CM | POA: Diagnosis present

## 2014-06-26 HISTORY — PX: INCISION AND DRAINAGE PERIRECTAL ABSCESS: SHX1804

## 2014-06-26 HISTORY — PX: EVALUATION UNDER ANESTHESIA WITH ANAL FISTULECTOMY: SHX5621

## 2014-06-26 HISTORY — DX: Anal fistula: K60.3

## 2014-06-26 HISTORY — DX: Anal fistula, unspecified: K60.30

## 2014-06-26 LAB — POCT HEMOGLOBIN-HEMACUE: HEMOGLOBIN: 16.5 g/dL (ref 13.0–17.0)

## 2014-06-26 SURGERY — EXAM UNDER ANESTHESIA WITH ANAL FISTULECTOMY
Anesthesia: General | Site: Rectum

## 2014-06-26 MED ORDER — FENTANYL CITRATE 0.05 MG/ML IJ SOLN
INTRAMUSCULAR | Status: DC | PRN
  Administered 2014-06-26 (×2): 50 ug via INTRAVENOUS

## 2014-06-26 MED ORDER — OXYCODONE HCL 5 MG PO TABS
5.0000 mg | ORAL_TABLET | ORAL | Status: DC | PRN
  Filled 2014-06-26: qty 2

## 2014-06-26 MED ORDER — KETOROLAC TROMETHAMINE 30 MG/ML IJ SOLN
INTRAMUSCULAR | Status: DC | PRN
  Administered 2014-06-26: 30 mg via INTRAVENOUS

## 2014-06-26 MED ORDER — ALBUTEROL SULFATE HFA 108 (90 BASE) MCG/ACT IN AERS
INHALATION_SPRAY | RESPIRATORY_TRACT | Status: DC | PRN
  Administered 2014-06-26 (×2): 2 via RESPIRATORY_TRACT

## 2014-06-26 MED ORDER — ACETAMINOPHEN 10 MG/ML IV SOLN
INTRAVENOUS | Status: DC | PRN
  Administered 2014-06-26: 1000 mg via INTRAVENOUS

## 2014-06-26 MED ORDER — LIDOCAINE HCL (CARDIAC) 20 MG/ML IV SOLN
INTRAVENOUS | Status: DC | PRN
  Administered 2014-06-26: 80 mg via INTRAVENOUS

## 2014-06-26 MED ORDER — MIDAZOLAM HCL 2 MG/2ML IJ SOLN
INTRAMUSCULAR | Status: AC
  Filled 2014-06-26: qty 4

## 2014-06-26 MED ORDER — KETAMINE HCL 50 MG/ML IJ SOLN
INTRAMUSCULAR | Status: AC
  Filled 2014-06-26: qty 10

## 2014-06-26 MED ORDER — LACTATED RINGERS IV SOLN
INTRAVENOUS | Status: DC
  Administered 2014-06-26 (×2): via INTRAVENOUS
  Filled 2014-06-26: qty 1000

## 2014-06-26 MED ORDER — SODIUM CHLORIDE 0.9 % IJ SOLN
3.0000 mL | INTRAMUSCULAR | Status: DC | PRN
  Filled 2014-06-26: qty 3

## 2014-06-26 MED ORDER — FENTANYL CITRATE 0.05 MG/ML IJ SOLN
INTRAMUSCULAR | Status: AC
  Filled 2014-06-26: qty 2

## 2014-06-26 MED ORDER — KETOROLAC TROMETHAMINE 30 MG/ML IJ SOLN
15.0000 mg | Freq: Once | INTRAMUSCULAR | Status: DC | PRN
  Filled 2014-06-26: qty 1

## 2014-06-26 MED ORDER — MIDAZOLAM HCL 5 MG/5ML IJ SOLN
INTRAMUSCULAR | Status: DC | PRN
  Administered 2014-06-26: 2 mg via INTRAVENOUS

## 2014-06-26 MED ORDER — ACETAMINOPHEN 650 MG RE SUPP
650.0000 mg | RECTAL | Status: DC | PRN
  Filled 2014-06-26: qty 1

## 2014-06-26 MED ORDER — ACETAMINOPHEN 325 MG PO TABS
650.0000 mg | ORAL_TABLET | ORAL | Status: DC | PRN
  Filled 2014-06-26: qty 2

## 2014-06-26 MED ORDER — DEXAMETHASONE SODIUM PHOSPHATE 4 MG/ML IJ SOLN
INTRAMUSCULAR | Status: DC | PRN
  Administered 2014-06-26: 10 mg via INTRAVENOUS

## 2014-06-26 MED ORDER — PROMETHAZINE HCL 25 MG/ML IJ SOLN
6.2500 mg | INTRAMUSCULAR | Status: DC | PRN
  Filled 2014-06-26: qty 1

## 2014-06-26 MED ORDER — FENTANYL CITRATE 0.05 MG/ML IJ SOLN
25.0000 ug | INTRAMUSCULAR | Status: DC | PRN
  Filled 2014-06-26: qty 1

## 2014-06-26 MED ORDER — OXYCODONE HCL 5 MG PO TABS: 5.0000 mg | ORAL_TABLET | Freq: Four times a day (QID) | ORAL | Status: DC | PRN

## 2014-06-26 MED ORDER — HYDROGEN PEROXIDE 1.5 % MT SOLN
OROMUCOSAL | Status: DC | PRN
  Administered 2014-06-26: 10 mL via TOPICAL

## 2014-06-26 MED ORDER — ONDANSETRON HCL 4 MG/2ML IJ SOLN
INTRAMUSCULAR | Status: DC | PRN
  Administered 2014-06-26: 4 mg via INTRAVENOUS

## 2014-06-26 MED ORDER — SODIUM CHLORIDE 0.9 % IJ SOLN
3.0000 mL | Freq: Two times a day (BID) | INTRAMUSCULAR | Status: DC
  Filled 2014-06-26: qty 3

## 2014-06-26 MED ORDER — PROPOFOL 10 MG/ML IV BOLUS
INTRAVENOUS | Status: DC | PRN
  Administered 2014-06-26: 150 mg via INTRAVENOUS
  Administered 2014-06-26: 200 mg via INTRAVENOUS

## 2014-06-26 MED ORDER — LIDOCAINE HCL 4 % MT SOLN
OROMUCOSAL | Status: DC | PRN
  Administered 2014-06-26: 4 mL via TOPICAL

## 2014-06-26 MED ORDER — LIDOCAINE 5 % EX OINT
TOPICAL_OINTMENT | CUTANEOUS | Status: DC | PRN
  Administered 2014-06-26: 1

## 2014-06-26 MED ORDER — SUCCINYLCHOLINE CHLORIDE 20 MG/ML IJ SOLN
INTRAMUSCULAR | Status: DC | PRN
  Administered 2014-06-26: 100 mg via INTRAVENOUS

## 2014-06-26 MED ORDER — BUPIVACAINE-EPINEPHRINE 0.5% -1:200000 IJ SOLN
INTRAMUSCULAR | Status: DC | PRN
  Administered 2014-06-26: 30 mL

## 2014-06-26 MED ORDER — SODIUM CHLORIDE 0.9 % IV SOLN
250.0000 mL | INTRAVENOUS | Status: DC | PRN
  Filled 2014-06-26: qty 250

## 2014-06-26 SURGICAL SUPPLY — 77 items
APL SKNCLS STERI-STRIP NONHPOA (GAUZE/BANDAGES/DRESSINGS) ×6
BENZOIN TINCTURE PRP APPL 2/3 (GAUZE/BANDAGES/DRESSINGS) ×10 IMPLANT
BLADE HEX COATED 2.75 (ELECTRODE) ×5 IMPLANT
BLADE SURG 10 STRL SS (BLADE) IMPLANT
BLADE SURG 15 STRL LF DISP TIS (BLADE) ×3 IMPLANT
BLADE SURG 15 STRL SS (BLADE) ×5
BNDG GAUZE ELAST 4 BULKY (GAUZE/BANDAGES/DRESSINGS) ×3 IMPLANT
BRIEF STRETCH FOR OB PAD LRG (UNDERPADS AND DIAPERS) ×10 IMPLANT
CANISTER SUCTION 2500CC (MISCELLANEOUS) ×5 IMPLANT
CANNULA VESSEL W/WING WO/VALVE (CANNULA) ×5 IMPLANT
CLOTH BEACON ORANGE TIMEOUT ST (SAFETY) ×5 IMPLANT
COVER MAYO STAND STRL (DRAPES) ×5 IMPLANT
COVER TABLE BACK 60X90 (DRAPES) ×5 IMPLANT
DECANTER SPIKE VIAL GLASS SM (MISCELLANEOUS) ×5 IMPLANT
DRAPE LAPAROTOMY T 102X78X121 (DRAPES) ×5 IMPLANT
DRAPE LG THREE QUARTER DISP (DRAPES) IMPLANT
DRAPE PED LAPAROTOMY (DRAPES) ×5 IMPLANT
DRAPE UNDERBUTTOCKS STRL (DRAPE) IMPLANT
DRAPE UTILITY XL STRL (DRAPES) ×7 IMPLANT
DRSG PAD ABDOMINAL 8X10 ST (GAUZE/BANDAGES/DRESSINGS) ×3 IMPLANT
ELECT BLADE 6.5 .24CM SHAFT (ELECTRODE) IMPLANT
ELECT BLADE TIP CTD 4 INCH (ELECTRODE) ×5 IMPLANT
ELECT REM PT RETURN 9FT ADLT (ELECTROSURGICAL) ×5
ELECTRODE REM PT RTRN 9FT ADLT (ELECTROSURGICAL) ×3 IMPLANT
GAUZE PACKING IODOFORM 2 (PACKING) ×3 IMPLANT
GAUZE SPONGE 4X4 16PLY XRAY LF (GAUZE/BANDAGES/DRESSINGS) ×5 IMPLANT
GAUZE VASELINE 3X9 (GAUZE/BANDAGES/DRESSINGS) IMPLANT
GLOVE BIO SURGEON STRL SZ 6.5 (GLOVE) ×8 IMPLANT
GLOVE BIO SURGEONS STRL SZ 6.5 (GLOVE) ×2
GLOVE BIOGEL PI IND STRL 7.0 (GLOVE) ×3 IMPLANT
GLOVE BIOGEL PI INDICATOR 7.0 (GLOVE) ×2
GLOVE INDICATOR 7.0 STRL GRN (GLOVE) ×10 IMPLANT
GOWN BRE IMP PREV XXLGXLNG (GOWN DISPOSABLE) ×5 IMPLANT
GOWN PREVENTION PLUS LG XLONG (DISPOSABLE) ×5 IMPLANT
GOWN PREVENTION PLUS XLARGE (GOWN DISPOSABLE) ×2 IMPLANT
GOWN STRL REIN XL XLG (GOWN DISPOSABLE) ×4 IMPLANT
GOWN STRL REUS W/TWL 2XL LVL3 (GOWN DISPOSABLE) ×3 IMPLANT
HOOK RETRACTION 12 ELAST STAY (MISCELLANEOUS) IMPLANT
IODOFORM GAUZE PACKING 1/2 INCH ×3 IMPLANT
IV CATH 14GX2 1/4 (CATHETERS) IMPLANT
LEGGING LITHOTOMY PAIR STRL (DRAPES) IMPLANT
LOOP VESSEL MAXI BLUE (MISCELLANEOUS) IMPLANT
LUBRICANT JELLY K Y 4OZ (MISCELLANEOUS) ×5 IMPLANT
NDL HYPO 25X1 1.5 SAFETY (NEEDLE) ×2 IMPLANT
NDL SAFETY ECLIPSE 18X1.5 (NEEDLE) IMPLANT
NEEDLE HYPO 18GX1.5 SHARP (NEEDLE)
NEEDLE HYPO 25X1 1.5 SAFETY (NEEDLE) ×5 IMPLANT
NS IRRIG 1000ML POUR BTL (IV SOLUTION) ×5 IMPLANT
NS IRRIG 500ML POUR BTL (IV SOLUTION) ×5 IMPLANT
PACK BASIN DAY SURGERY FS (CUSTOM PROCEDURE TRAY) ×5 IMPLANT
PAD ABD 8X10 STRL (GAUZE/BANDAGES/DRESSINGS) ×5 IMPLANT
PENCIL BUTTON HOLSTER BLD 10FT (ELECTRODE) ×5 IMPLANT
RETRACTOR STERILE 25.8CMX11.3 (INSTRUMENTS) IMPLANT
SPONGE GAUZE 4X4 12PLY (GAUZE/BANDAGES/DRESSINGS) IMPLANT
SPONGE GAUZE 4X4 12PLY STER LF (GAUZE/BANDAGES/DRESSINGS) ×5 IMPLANT
SPONGE SURGIFOAM ABS GEL 12-7 (HEMOSTASIS) IMPLANT
SUCTION FRAZIER TIP 10 FR DISP (SUCTIONS) ×5 IMPLANT
SUT CHROMIC 2 0 SH (SUTURE) ×8 IMPLANT
SUT CHROMIC 3 0 SH 27 (SUTURE) IMPLANT
SUT ETHIBOND 0 (SUTURE) IMPLANT
SUT GUT CHROMIC 3 0 (SUTURE) ×10 IMPLANT
SUT SILK 2 0 (SUTURE)
SUT SILK 2-0 18XBRD TIE 12 (SUTURE) IMPLANT
SUT VIC AB 2-0 SH 18 (SUTURE) IMPLANT
SUT VIC AB 2-0 SH 27 (SUTURE) ×5
SUT VIC AB 2-0 SH 27X BRD (SUTURE) ×3 IMPLANT
SUT VIC AB 3-0 SH 18 (SUTURE) IMPLANT
SUT VIC AB 4-0 P-3 18XBRD (SUTURE) IMPLANT
SUT VIC AB 4-0 P3 18 (SUTURE)
SYR 20CC LL (SYRINGE) ×5 IMPLANT
SYR CONTROL 10ML LL (SYRINGE) ×5 IMPLANT
SYRINGE 12CC LL (MISCELLANEOUS) ×5 IMPLANT
TOWEL OR 17X24 6PK STRL BLUE (TOWEL DISPOSABLE) ×10 IMPLANT
TRAY DSU PREP LF (CUSTOM PROCEDURE TRAY) ×5 IMPLANT
TUBE CONNECTING 12'X1/4 (SUCTIONS) ×1
TUBE CONNECTING 12X1/4 (SUCTIONS) ×4 IMPLANT
YANKAUER SUCT BULB TIP NO VENT (SUCTIONS) ×5 IMPLANT

## 2014-06-26 NOTE — Op Note (Signed)
06/26/2014  9:50 AM  PATIENT:  Todd Elliott  58 y.o. male  Patient Care Team: Provider Not In System as PCP - General  PRE-OPERATIVE DIAGNOSIS:  anal fistula  POST-OPERATIVE DIAGNOSIS:  anal abscess  PROCEDURE:  ANAL EXAM UNDER ANESTHESIA  IRRIGATION AND DEBRIDEMENT PERIRECTAL ABSCESS   Surgeon(s): [name], MD  ASSISTANT: none   ANESTHESIA:   local and general  SPECIMEN:  No Specimen  DISPOSITION OF SPECIMEN:  N/A  COUNTS:  YES  PLAN OF CARE: Discharge to home after PACU  PATIENT DISPOSITION:  PACU - hemodynamically stable.  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  He has had a draining seton in an abscess cavity sitting next to a deep post-anal abscess/fistula.  His healing has stalled as an outpatient and we have decided to evaluate in the OR.   OR FINDINGS: Deep abscess.  No signs of residual fistula.    DESCRIPTION: the patient was identified in the preoperative holding area and taken to the OR where they were laid on the operating room table.  General anesthesia was induced without difficulty. The patient was then positioned in prone jackknife position with buttocks gently taped apart.  The patient was then prepped and draped in usual sterile fashion.  SCDs were noted to be in place prior to the initiation of anesthesia. A surgical timeout was performed indicating the correct patient, procedure, positioning and need for preoperative antibiotics.  I began with a digital rectal exam.  There were no obvious masses.  There was a divot in the posterior deep posterior anal canal.  I then placed a Hill-Ferguson anoscope into the anal canal and evaluated this completely.  There was no obvious internal opening.  I removed the seton.  I opened the tract perianally.  I palpated a deep canal in the left posterior perirectal region.  I placed an S shaped fistula probe.  I could not find any communication to the rectum.  I infused hydrogen peroxide  using a vessel introducer.  There was no communication with the rectum.  The cavity was irrigated.  I then packed this with iodoform gauze.  A dressing was applied.  All counts were correct per OR staff.  The patient was sent to the PACU in stable condition after awakening from anesthesia.

## 2014-06-26 NOTE — H&P (Signed)
  Chief Complaint: perirectal pain HPI: This is a 58 y.o. M with a horseshow fistula that is well know to me. He is ~3 months s/p modified Hanley procedure for his fistula. I believe he has healed as much as possible with the seton He denies nausea or vomiting. He is having regular BM's.    Past Medical History  Diagnosis Date  . Hemorrhoid   . Thyroid disease   . Hypothyroidism     Past Surgical History  Procedure Laterality Date  . Arm surgery Right   . Appendectomy    . Abdominal surgery    . Gastric resection    . Hernia repair Right   . Cholecystectomy    . Incision and drainage perirectal abscess Left 12/27/2013    Procedure: IRRIGATION AND DEBRIDEMENT PERIRECTAL ABSCESS; Surgeon: Edward Jolly, MD; Location: [facility]; Service: General; Laterality: Left;  . Colonoscopy    . Colon surgery    . Examination under anesthesia N/A 01/30/2014    Procedure: EXAM UNDER ANESTHESIA; Surgeon: [name], MD; Location: [facility]; Service: General; Laterality: N/A;  . Anal fistulotomy N/A 01/30/2014    Procedure: ANAL FISTULOTOMY WITH DRAIN PLACEMENT; Surgeon: [name], MD; Location: [facility]; Service: General; Laterality: N/A;    Family History  Problem Relation Age of Onset  . Colon cancer Father 48  . Cancer Father     colon  . Diabetes Maternal Aunt     x2  . Diabetes Maternal Uncle   . Diabetes      Mat great GM  . Heart disease Maternal Grandfather   . Cancer Maternal Grandfather     lymphoma   Social History:  reports that he has been smoking Cigarettes. He has been smoking about 0.00 packs per day for the past 35 years. He has never used smokeless tobacco. He reports that he drinks alcohol. He reports that he does not use illicit drugs.  Allergies: No Known Allergies   Review of Systems   Constitutional: Neg for fevers or chills Eyes: Negative for blurred vision.  Respiratory: Negative for cough and shortness of breath.  Cardiovascular: Negative for chest pain.  Gastrointestinal: Negative for nausea, vomiting, abdominal pain, diarrhea, constipation and blood in stool.  Genitourinary: Negative for dysuria, urgency and frequency.  Musculoskeletal: Negative for myalgias.  Neurological: Negative for headaches.    BP 146/96 mmHg  Pulse 85  Temp(Src) 97.9 F (36.6 C) (Oral)  Resp 16  Ht $R'5\' 10"'tj$  (1.778 m)  Wt 229 lb 8 oz (104.101 kg)  BMI 32.93 kg/m2  SpO2 98%  Physical Exam  Constitutional: He is oriented to person, place, and time. He appears well-developed and well-nourished.  HENT:  Head: Normocephalic and atraumatic.  Eyes: Conjunctivae are normal. Pupils are equal, round, and reactive to light.  Neck: Normal range of motion.  Cardiovascular: Normal rate and regular rhythm.  Respiratory: Effort normal and breath sounds normal.  GI: Soft. Bowel sounds are normal. He exhibits no distension. There is no tenderness.  Seton in place Musculoskeletal: Normal range of motion.  Neurological: He is alert and oriented to person, place, and time.  Skin: Skin is warm and dry.     Assessment/Plan This patient appears to be developing another abscess at his lateral incision. I have recommended evaluation in the OR with possible MAF vs fistulotomy. Risks include bleeding, recurrence and pain.There is a risk of incontinence and anal leak with fistulotomy.   Todd Elliott C. 3/78/5885, 02:77 AM

## 2014-06-26 NOTE — Transfer of Care (Signed)
Immediate Anesthesia Transfer of Care Note  Patient: Todd Elliott  Procedure(s) Performed: Procedure(s) (LRB): ANAL EXAM UNDER ANESTHESIA  (N/A) IRRIGATION AND DEBRIDEMENT PERIRECTAL ABSCESS  Patient Location: PACU  Anesthesia Type: General  Level of Consciousness: awake, alert  and oriented  Airway & Oxygen Therapy: Patient Spontanous Breathing and Patient connected to face mask oxygen  Post-op Assessment: Report given to PACU RN and Post -op Vital signs reviewed and stable  Post vital signs: Reviewed and stable  Complications: No apparent anesthesia complications

## 2014-06-26 NOTE — Anesthesia Procedure Notes (Addendum)
Procedure Name: Intubation Date/Time: 06/26/2014 8:53 AM Performed by: Mechele Claude Pre-anesthesia Checklist: Patient identified, Emergency Drugs available, Suction available and Patient being monitored Patient Re-evaluated:Patient Re-evaluated prior to inductionOxygen Delivery Method: Circle System Utilized Preoxygenation: Pre-oxygenation with 100% oxygen Intubation Type: IV induction Ventilation: Mask ventilation without difficulty Laryngoscope Size: Mac and 4 Grade View: Grade I Tube type: Oral Tube size: 8.0 mm Number of attempts: 1 Airway Equipment and Method: stylet Placement Confirmation: ETT inserted through vocal cords under direct vision,  positive ETCO2 and breath sounds checked- equal and bilateral Secured at: 22 cm Tube secured with: Tape Dental Injury: Teeth and Oropharynx as per pre-operative assessment

## 2014-06-26 NOTE — Progress Notes (Signed)
Dr. Marcello Moores called and asked about discharge instructions, instructed patient to disregard instructions about taking valium at home on home instructions.

## 2014-06-26 NOTE — Discharge Instructions (Addendum)
ANORECTAL SURGERY: POST OP INSTRUCTIONS 1. Take your usually prescribed home medications unless otherwise directed. 2. DIET: During the first few hours after surgery sip on some liquids until you are able to urinate.  It is normal to not urinate for several hours after this surgery.  If you feel uncomfortable, please contact the office for instructions.  After you are able to urinate,you may eat, if you feel like it.  Follow a light bland diet the first 24 hours after arrival home, such as soup, liquids, crackers, etc.  Be sure to include lots of fluids daily (6-8 glasses).  Avoid fast food or heavy meals, as your are more likely to get nauseated.  Eat a low fat diet the next few days after surgery.  Limit caffeine intake to 1-2 servings a day. 3. PAIN CONTROL: a. Pain is best controlled by a usual combination of several different methods TOGETHER: i. Muscle relaxation 1.  Soak in a warm bath (or Sitz bath) three times a day and after bowel movements.  Continue to do this until all pain is resolved. 2. Take the muscle relaxer (Valium) every 6 hours for the first 2 days after surgery  ii. Over the counter pain medication iii. Prescription pain medication b. Most patients will experience some swelling and discomfort in the anus/rectal area and incisions.  Heat such as warm towels, sitz baths, warm baths, etc to help relax tight/sore spots and speed recovery.  Some people prefer to use ice, especially in the first couple days after surgery, as it may decrease the pain and swelling, or alternate between ice & heat.  Experiment to what works for you.  Swelling and bruising can take several weeks to resolve.  Pain can take even longer to completely resolve. c. It is helpful to take an over-the-counter pain medication regularly for the first few weeks.  Choose one of the following that works best for you: i. Naproxen (Aleve, etc)  Two 279m tabs twice a day ii. Ibuprofen (Advil, etc) Three 203mtabs four  times a day (every meal & bedtime) d. A  prescription for pain medication (such as percocet, oxycodone, hydrocodone, etc) should be given to you upon discharge.  Take your pain medication as prescribed.  i. If you are having problems/concerns with the prescription medicine (does not control pain, nausea, vomiting, rash, itching, etc), please call [phone] see if we need to switch you to a different pain medicine that will work better for you and/or control your side effect better. ii. If you need a refill on your pain medication, please contact your pharmacy.  They will contact our office to request authorization. Prescriptions will not be filled after 5 pm or on week-ends. 4. KEEP YOUR BOWELS REGULAR and AVOID CONSTIPATION a. The goal is one to two soft bowel movements a day.  You should at least have a bowel movement every other day. b. Avoid getting constipated.  Between the surgery and the pain medications, it is common to experience some constipation. This can be very painful after rectal surgery.  Increasing fluid intake and taking a fiber supplement (such as Metamucil, Citrucel, FiberCon, etc) 1-2 times a day regularly will usually help prevent this problem from occurring.  A stool softener like colace is also recommended.  This can be purchased over the counter at your pharmacy.  You can take it up to 3 times a day.  If you do not have a bowel movement after 24 hrs since your surgery,  take one does of milk of magnesia.  If you still haven't had a bowel movement 8-12 hours after that dose, take another dose.  If you don't have a bowel movement 48 hrs after surgery, purchase a Fleets enema from the drug store and administer gently per package instructions.  If you still are having trouble with your bowel movements after that, please call the office for further instructions. c. If you develop diarrhea or have many loose bowel movements, simplify your diet to bland foods & liquids for a few  days.  Stop any stool softeners and decrease your fiber supplement.  Switching to mild anti-diarrheal medications (Kayopectate, Pepto Bismol) can help.  If this worsens or does not improve, please call us.  5. Wound Care a. Remove your bandages before your first bowel movement or 8 hours after surgery.     b. Remove wound packing material ~6 inches per day and cut off the extra.  You do not need to repack the wound unless instructed otherwise.  All of the packing should be out of the wound in 3 days.  Wear an absorbent pad or soft cotton gauze in your underwear to catch any drainage and help keep the area clean. You should change this every 2-3 hours while awake.   c. Keep the area clean and dry.  Bathe / shower every day, especially after bowel movements.  Keep the area clean by showering / bathing over the incision / wound.   It is okay to soak an open wound to help wash it.  Wet wipes or showers / gentle washing after bowel movements is often less traumatic than regular toilet paper. d. Dennis Bast may have some styrofoam-like soft packing in the rectum which will come out with the first bowel movement.  e. You will often notice bleeding with bowel movements.  This should slow down by the end of the first week of surgery f. Expect some drainage.  This should slow down, too, by the end of the first week of surgery.  Wear an absorbent pad or soft cotton gauze in your underwear until the drainage stops. g. Do Not sit on a rubber or pillow ring.  This can make you symptoms worse.  You may sit on a soft pillow if needed.  6. ACTIVITIES as tolerated:   a. You may resume regular (light) daily activities beginning the next day--such as daily self-care, walking, climbing stairs--gradually increasing activities as tolerated.  If you can walk 30 minutes without difficulty, it is safe to try more intense activity such as jogging, treadmill, bicycling, low-impact aerobics, swimming, etc. b. Save the most intensive and  strenuous activity for last such as sit-ups, heavy lifting, contact sports, etc  Refrain from any heavy lifting or straining until you are off narcotics for pain control.   c. You may drive when you are no longer taking prescription pain medication, you can comfortably sit for long periods of time, and you can safely maneuver your car and apply brakes. d. Dennis Bast may have sexual intercourse when it is comfortable.  7. FOLLOW UP in our office a. Please call [facility] at [phone] to set up an appointment to see your surgeon in the office for a follow-up appointment approximately 3-4 weeks after your surgery. b. Make sure that you call for this appointment the day you arrive home to insure a convenient appointment time. 10. IF YOU HAVE DISABILITY OR FAMILY LEAVE FORMS, BRING THEM TO THE OFFICE FOR PROCESSING.  DO NOT  GIVE THEM TO YOUR DOCTOR.     WHEN TO CALL us [phone]: 1. Poor pain control 2. Reactions / problems with new medications (rash/itching, nausea, etc)  3. Fever over 101.5 F (38.5 C) 4. Inability to urinate 5. Nausea and/or vomiting 6. Worsening swelling or bruising 7. Continued bleeding from incision. 8. Increased pain, redness, or drainage from the incision  The clinic staff is available to answer your questions during regular business hours (8:30am-5pm).  Please dont hesitate to call and ask to speak to one of our nurses for clinical concerns.   A surgeon from [facility] is always on call at the hospitals   If you have a medical emergency, go to the nearest emergency room or call 911.    [facility], [place], [place], [place], [place]  [address] ? MAIN: [phone] ? TOLL FREE: [phone] ? FAX [phone] [email or web address]    Post Anesthesia Home Care Instructions  Activity: Get plenty of rest for the remainder of the day. A responsible adult should stay with you for 24 hours following the  procedure.  For the next 24 hours, DO NOT: -Drive a car -Paediatric nurse -Drink alcoholic beverages -Take any medication unless instructed by your physician -Make any legal decisions or sign important papers.  Meals: Start with liquid foods such as gelatin or soup. Progress to regular foods as tolerated. Avoid greasy, spicy, heavy foods. If nausea and/or vomiting occur, drink only clear liquids until the nausea and/or vomiting subsides. Call your physician if vomiting continues.  Special Instructions/Symptoms: Your throat may feel dry or sore from the anesthesia or the breathing tube placed in your throat during surgery. If this causes discomfort, gargle with warm salt water. The discomfort should disappear within 24 hours.

## 2014-06-27 ENCOUNTER — Encounter (HOSPITAL_BASED_OUTPATIENT_CLINIC_OR_DEPARTMENT_OTHER): Payer: Self-pay | Admitting: General Surgery

## 2014-06-27 NOTE — Anesthesia Postprocedure Evaluation (Signed)
  Anesthesia Post-op Note  Patient: Todd Elliott  Procedure(s) Performed: Procedure(s) (LRB): ANAL EXAM UNDER ANESTHESIA  (N/A) IRRIGATION AND DEBRIDEMENT PERIRECTAL ABSCESS  Patient Location: PACU  Anesthesia Type: General  Level of Consciousness: awake and alert   Airway and Oxygen Therapy: Patient Spontanous Breathing  Post-op Pain: mild  Post-op Assessment: Post-op Vital signs reviewed, Patient's Cardiovascular Status Stable, Respiratory Function Stable, Patent Airway and No signs of Nausea or vomiting  Last Vitals:  Filed Vitals:   06/26/14 1200  BP: 146/86  Pulse: 71  Temp: 36.6 C  Resp: 18    Post-op Vital Signs: stable   Complications: No apparent anesthesia complications

## 2014-07-01 ENCOUNTER — Telehealth (INDEPENDENT_AMBULATORY_CARE_PROVIDER_SITE_OTHER): Payer: Self-pay

## 2014-07-01 NOTE — Telephone Encounter (Signed)
Pt is s/p anal abscess and horseshoe fistula.  He states that his packing has come completely out.  Is this okay?  Please advise.

## 2014-07-01 NOTE — Telephone Encounter (Signed)
As I told him post op.  If it wasn't all out by Sat, pull it all out.

## 2015-01-30 ENCOUNTER — Other Ambulatory Visit: Payer: Self-pay | Admitting: General Surgery

## 2015-01-30 DIAGNOSIS — T8183XA Persistent postprocedural fistula, initial encounter: Secondary | ICD-10-CM

## 2015-01-30 DIAGNOSIS — L988 Other specified disorders of the skin and subcutaneous tissue: Secondary | ICD-10-CM

## 2015-02-21 ENCOUNTER — Ambulatory Visit
Admission: RE | Admit: 2015-02-21 | Discharge: 2015-02-21 | Disposition: A | Discharge status: Home / Self Care | Payer: PRIVATE HEALTH INSURANCE | Source: Ambulatory Visit | Attending: General Surgery | Admitting: General Surgery

## 2015-02-21 ENCOUNTER — Ambulatory Visit
Admission: RE | Admit: 2015-02-21 | Discharge: 2015-02-21 | Disposition: A | Discharge status: Home / Self Care | Payer: PRIVATE HEALTH INSURANCE | Source: Ambulatory Visit | Attending: Gastroenterology | Admitting: Gastroenterology

## 2015-02-21 DIAGNOSIS — Z139 Encounter for screening, unspecified: Secondary | ICD-10-CM

## 2015-02-21 MED ORDER — GADOBENATE DIMEGLUMINE 529 MG/ML IV SOLN
20.0000 mL | Freq: Once | INTRAVENOUS | Status: AC | PRN
  Administered 2015-02-21: 20 mL via INTRAVENOUS

## 2015-03-20 ENCOUNTER — Other Ambulatory Visit: Payer: Self-pay | Admitting: General Surgery

## 2015-03-20 NOTE — H&P (Signed)
Todd Elliott 03/20/2015 11:49 AM Location: [facility] Patient #: [id number] DOB: 12-31-1955 Married / Language: Todd Elliott / Race: White Male  History of Present Illness [name] MD; 08/22/6158 12:13 PM) Patient words: reck.  The patient is a 59 year old male who presents with anal fistula. 59 year old male who presents to the office for follow-up of a perirectal fistula. He underwent a MRI recently to evaluate for this fistula. It shows that he continues to have communication with the rectum. The area has failed to heal with conservative management. The patient continues to have rectal drainage on a daily basis.   Problem List/Past Medical [name], MD; 02/15/7105 12:14 PM) ANAL FISTULA (565.1  K60.3) RECTAL ABSCESS (566  K61.1)  Past Surgical History [name], MD; 09/22/9483 12:14 PM) Resection of Small Bowel Gallbladder Surgery - Laparoscopic Laparoscopic Inguinal Hernia Surgery Right. ANAL FISTULA REPAIR [id number]) Appendectomy Colon Polyp Removal - Colonoscopy  Allergies Davy Pique Bynum, CMA; 03/20/2015 11:50 AM) No Known Drug Allergies09/16/2015  Medication History [name], MD; 10/19/91 12:14 PM) Multiple Vitamin (Oral) Active. Colace (100MG  Capsule, Oral) Active. Centrum Silver Adult 50+ (Oral) Active. Pyridoxine HCl (100MG  Tablet, Oral) Active. Levothyroxine Sodium (175MCG Tablet, Oral) Active. Medications Reconciled OxyCODONE HCl (Abuse Deter) (5MG  Tablet, Oral as needed) Active.  Social History [name], MD; 03/16/8298 12:14 PM) Alcohol use Occasional alcohol use. Illicit drug use Remotely quit drug use. Tobacco use Current some day smoker. No caffeine use  Family History [name], MD; 10/21/1694 12:14 PM) Colon Cancer Father. Thyroid problems Father, Mother, Sister.  Review of Systems [name] MD; 02/21/9380 12:14 PM) General Present- Fatigue, Night Sweats and Weight Gain. Not Present- Appetite  Loss, Chills, Fever and Weight Loss. Skin Present- Non-Healing Wounds. Not Present- Change in Wart/Mole, Dryness, Hives, Jaundice, New Lesions, Rash and Ulcer. HEENT Present- Sinus Pain and Wears glasses/contact lenses. Not Present- Earache, Hearing Loss, Hoarseness, Nose Bleed, Oral Ulcers, Ringing in the Ears, Seasonal Allergies, Sore Throat, Visual Disturbances and Yellow Eyes. Breast Not Present- Breast Mass, Breast Pain, Nipple Discharge and Skin Changes. Cardiovascular Not Present- Chest Pain, Difficulty Breathing Lying Down, Leg Cramps, Palpitations, Rapid Heart Rate, Shortness of Breath and Swelling of Extremities. Gastrointestinal Present- Bloating, Bloody Stool and Hemorrhoids. Not Present- Abdominal Pain, Change in Bowel Habits, Chronic diarrhea, Constipation, Difficulty Swallowing, Excessive gas, Gets full quickly at meals, Indigestion, Nausea, Rectal Pain and Vomiting. Male Genitourinary Present- Frequency and Nocturia. Not Present- Blood in Urine, Change in Urinary Stream, Impotence, Painful Urination, Urgency and Urine Leakage. Musculoskeletal Present- Back Pain. Not Present- Joint Pain, Joint Stiffness, Muscle Pain, Muscle Weakness and Swelling of Extremities. Neurological Present- Headaches. Not Present- Decreased Memory, Fainting, Numbness, Seizures, Tingling, Tremor, Trouble walking and Weakness. Psychiatric Present- Anxiety and Change in Sleep Pattern. Not Present- Bipolar, Depression, Fearful and Frequent crying. Endocrine Present- Hair Changes. Not Present- Cold Intolerance, Excessive Hunger, Heat Intolerance and New Diabetes. Hematology Present- Persistent Infections. Not Present- Easy Bruising, Excessive bleeding, Gland problems and HIV.   Vitals (Sonya Bynum CMA; 03/20/2015 11:50 AM) 03/20/2015 11:49 AM Weight: 227 lb Height: 67in Body Surface Area: 2.21 m Body Mass Index: 35.55 kg/m Temp.: 97.62F(Temporal)  Pulse: 88 (Regular)  BP: 130/72 (Sitting, Left Arm,  Standard)    Physical Exam [name] MD; 0/08/7508 12:16 PM) General Mental Status-Alert. General Appearance-Cooperative.  Head and Neck Head-normocephalic, atraumatic with no lesions or palpable masses.  Eye Sclera/Conjunctiva - Bilateral-Normal.  Chest and Lung Exam Chest and lung exam reveals -quiet, even and easy respiratory effort  with no use of accessory muscles and on auscultation, normal breath sounds, no adventitious sounds and normal vocal resonance.  Cardiovascular Cardiovascular examination reveals -normal heart sounds, regular rate and rhythm with no murmurs.  Abdomen Palpation/Percussion Palpation and Percussion of the abdomen reveal - Soft and Non Tender.  Rectal Anorectal Exam External - Note: Fistula probe easily inserted into the cavity approximately 3-4 cm. No purulent drainage noted.    Assessment & Plan [name] MD; 09/21/35 12:13 PM) ANAL FISTULA (565.1  K60.3) Impression: We have attempted medical management of his perianal wound for approximately a year now. MRI shows no change in his anatomy. I have recommended a fistulotomy. We discussed that he may have some incontinence after surgery. We discussed that we could address that over time if needed. I do not think there are any other viable options to get this area to heal.

## 2015-04-14 ENCOUNTER — Encounter (HOSPITAL_BASED_OUTPATIENT_CLINIC_OR_DEPARTMENT_OTHER): Payer: Self-pay | Admitting: *Deleted

## 2015-04-14 NOTE — Progress Notes (Signed)
NPO AFTER MN.  ARRIVE AT 0800.  NEEDS HG.  WILL TAKE SYNTHROID AM DOS W/ SIPS OF WATER.

## 2015-04-17 ENCOUNTER — Encounter (HOSPITAL_BASED_OUTPATIENT_CLINIC_OR_DEPARTMENT_OTHER): Payer: Self-pay | Admitting: Anesthesiology

## 2015-04-17 ENCOUNTER — Encounter (HOSPITAL_BASED_OUTPATIENT_CLINIC_OR_DEPARTMENT_OTHER)
Admission: RE | Disposition: A | Discharge status: Home / Self Care | Payer: Self-pay | Source: Ambulatory Visit | Attending: General Surgery

## 2015-04-17 ENCOUNTER — Ambulatory Visit (HOSPITAL_BASED_OUTPATIENT_CLINIC_OR_DEPARTMENT_OTHER): Payer: No Typology Code available for payment source | Admitting: Anesthesiology

## 2015-04-17 ENCOUNTER — Ambulatory Visit (HOSPITAL_BASED_OUTPATIENT_CLINIC_OR_DEPARTMENT_OTHER)
Admission: RE | Admit: 2015-04-17 | Discharge: 2015-04-17 | Disposition: A | Discharge status: Home / Self Care | Payer: No Typology Code available for payment source | Source: Ambulatory Visit | Attending: General Surgery | Admitting: General Surgery

## 2015-04-17 DIAGNOSIS — K603 Anal fistula: Secondary | ICD-10-CM | POA: Insufficient documentation

## 2015-04-17 DIAGNOSIS — Z86010 Personal history of colonic polyps: Secondary | ICD-10-CM | POA: Insufficient documentation

## 2015-04-17 DIAGNOSIS — F17210 Nicotine dependence, cigarettes, uncomplicated: Secondary | ICD-10-CM | POA: Insufficient documentation

## 2015-04-17 DIAGNOSIS — E039 Hypothyroidism, unspecified: Secondary | ICD-10-CM | POA: Insufficient documentation

## 2015-04-17 HISTORY — PX: EVALUATION UNDER ANESTHESIA WITH ANAL FISTULECTOMY: SHX5621

## 2015-04-17 LAB — POCT HEMOGLOBIN-HEMACUE: HEMOGLOBIN: 16.7 g/dL (ref 13.0–17.0)

## 2015-04-17 SURGERY — EXAM UNDER ANESTHESIA WITH ANAL FISTULECTOMY
Anesthesia: General

## 2015-04-17 MED ORDER — DEXAMETHASONE SODIUM PHOSPHATE 4 MG/ML IJ SOLN
INTRAMUSCULAR | Status: DC | PRN
Start: 2015-04-17 — End: 2015-04-17
  Administered 2015-04-17: 10 mg via INTRAVENOUS

## 2015-04-17 MED ORDER — FENTANYL CITRATE (PF) 100 MCG/2ML IJ SOLN
INTRAMUSCULAR | Status: DC | PRN
  Administered 2015-04-17: 50 ug via INTRAVENOUS

## 2015-04-17 MED ORDER — LIDOCAINE 5 % EX OINT
TOPICAL_OINTMENT | CUTANEOUS | Status: DC | PRN
  Administered 2015-04-17: 1 via TOPICAL

## 2015-04-17 MED ORDER — ACETAMINOPHEN 325 MG PO TABS
650.0000 mg | ORAL_TABLET | ORAL | Status: DC | PRN
  Filled 2015-04-17: qty 2

## 2015-04-17 MED ORDER — OXYCODONE HCL 5 MG PO TABS
5.0000 mg | ORAL_TABLET | ORAL | Status: DC | PRN
  Filled 2015-04-17: qty 2

## 2015-04-17 MED ORDER — PROMETHAZINE HCL 25 MG/ML IJ SOLN
6.2500 mg | INTRAMUSCULAR | Status: DC | PRN
Start: 2015-04-17 — End: 2015-04-17
  Filled 2015-04-17: qty 1

## 2015-04-17 MED ORDER — SODIUM CHLORIDE 0.9 % IJ SOLN
3.0000 mL | Freq: Two times a day (BID) | INTRAMUSCULAR | Status: DC
  Filled 2015-04-17: qty 3

## 2015-04-17 MED ORDER — LIDOCAINE HCL (CARDIAC) 20 MG/ML IV SOLN
INTRAVENOUS | Status: DC | PRN
  Administered 2015-04-17: 60 mg via INTRAVENOUS

## 2015-04-17 MED ORDER — BUPIVACAINE LIPOSOME 1.3 % IJ SUSP
INTRAMUSCULAR | Status: DC | PRN
  Administered 2015-04-17: 20 mL

## 2015-04-17 MED ORDER — ONDANSETRON HCL 4 MG/2ML IJ SOLN
INTRAMUSCULAR | Status: DC | PRN
Start: 2015-04-17 — End: 2015-04-17
  Administered 2015-04-17: 4 mg via INTRAVENOUS

## 2015-04-17 MED ORDER — LACTATED RINGERS IV SOLN
INTRAVENOUS | Status: DC
  Administered 2015-04-17 (×2): via INTRAVENOUS
  Filled 2015-04-17: qty 1000

## 2015-04-17 MED ORDER — MIDAZOLAM HCL 2 MG/2ML IJ SOLN
INTRAMUSCULAR | Status: AC
  Filled 2015-04-17: qty 2

## 2015-04-17 MED ORDER — MIDAZOLAM HCL 5 MG/5ML IJ SOLN
INTRAMUSCULAR | Status: DC | PRN
  Administered 2015-04-17: 2 mg via INTRAVENOUS

## 2015-04-17 MED ORDER — SODIUM CHLORIDE 0.9 % IJ SOLN
3.0000 mL | INTRAMUSCULAR | Status: DC | PRN
  Filled 2015-04-17: qty 3

## 2015-04-17 MED ORDER — OXYCODONE HCL 5 MG PO TABS: 5.0000 mg | ORAL_TABLET | ORAL | Status: DC | PRN

## 2015-04-17 MED ORDER — FENTANYL CITRATE (PF) 100 MCG/2ML IJ SOLN
25.0000 ug | INTRAMUSCULAR | Status: DC | PRN
  Filled 2015-04-17: qty 1

## 2015-04-17 MED ORDER — EPHEDRINE SULFATE 50 MG/ML IJ SOLN
INTRAMUSCULAR | Status: DC | PRN
  Administered 2015-04-17: 10 mg via INTRAVENOUS

## 2015-04-17 MED ORDER — SODIUM CHLORIDE 0.9 % IV SOLN
250.0000 mL | INTRAVENOUS | Status: DC | PRN
  Filled 2015-04-17: qty 250

## 2015-04-17 MED ORDER — SUCCINYLCHOLINE CHLORIDE 20 MG/ML IJ SOLN
INTRAMUSCULAR | Status: DC | PRN
  Administered 2015-04-17: 100 mg via INTRAVENOUS

## 2015-04-17 MED ORDER — KETAMINE HCL 50 MG/ML IJ SOLN
INTRAMUSCULAR | Status: AC
  Filled 2015-04-17: qty 10

## 2015-04-17 MED ORDER — FENTANYL CITRATE (PF) 100 MCG/2ML IJ SOLN
INTRAMUSCULAR | Status: AC
  Filled 2015-04-17: qty 4

## 2015-04-17 MED ORDER — PROPOFOL 10 MG/ML IV BOLUS
INTRAVENOUS | Status: DC | PRN
  Administered 2015-04-17: 100 mg via INTRAVENOUS
  Administered 2015-04-17: 80 mg via INTRAVENOUS

## 2015-04-17 MED ORDER — KETOROLAC TROMETHAMINE 30 MG/ML IJ SOLN
30.0000 mg | Freq: Once | INTRAMUSCULAR | Status: DC
  Filled 2015-04-17: qty 1

## 2015-04-17 MED ORDER — LOPERAMIDE HCL 2 MG PO TABS: 2.0000 mg | ORAL_TABLET | ORAL | Status: DC | PRN

## 2015-04-17 MED ORDER — ACETAMINOPHEN 650 MG RE SUPP
650.0000 mg | RECTAL | Status: DC | PRN
  Filled 2015-04-17: qty 1

## 2015-04-17 MED ORDER — ALBUTEROL SULFATE HFA 108 (90 BASE) MCG/ACT IN AERS
INHALATION_SPRAY | RESPIRATORY_TRACT | Status: DC | PRN
  Administered 2015-04-17: 2 via RESPIRATORY_TRACT
  Administered 2015-04-17: 6 via RESPIRATORY_TRACT

## 2015-04-17 MED ORDER — BUPIVACAINE-EPINEPHRINE 0.5% -1:200000 IJ SOLN
INTRAMUSCULAR | Status: DC | PRN
  Administered 2015-04-17: 17 mL

## 2015-04-17 SURGICAL SUPPLY — 58 items
APL SKNCLS STERI-STRIP NONHPOA (GAUZE/BANDAGES/DRESSINGS) ×2
BENZOIN TINCTURE PRP APPL 2/3 (GAUZE/BANDAGES/DRESSINGS) ×4 IMPLANT
BLADE HEX COATED 2.75 (ELECTRODE) ×2 IMPLANT
BLADE SURG 10 STRL SS (BLADE) IMPLANT
BLADE SURG 15 STRL LF DISP TIS (BLADE) ×1 IMPLANT
BLADE SURG 15 STRL SS (BLADE) ×2
BRIEF STRETCH FOR OB PAD LRG (UNDERPADS AND DIAPERS) ×4 IMPLANT
CANISTER SUCTION 2500CC (MISCELLANEOUS) ×2 IMPLANT
CLOTH BEACON ORANGE TIMEOUT ST (SAFETY) ×2 IMPLANT
COVER BACK TABLE 60X90IN (DRAPES) ×2 IMPLANT
COVER MAYO STAND STRL (DRAPES) ×2 IMPLANT
DECANTER SPIKE VIAL GLASS SM (MISCELLANEOUS) ×2 IMPLANT
DRAPE LG THREE QUARTER DISP (DRAPES) IMPLANT
DRAPE PED LAPAROTOMY (DRAPES) ×2 IMPLANT
DRAPE UNDERBUTTOCKS STRL (DRAPE) IMPLANT
DRAPE UTILITY XL STRL (DRAPES) ×2 IMPLANT
ELECT BLADE 6.5 .24CM SHAFT (ELECTRODE) ×1 IMPLANT
ELECT REM PT RETURN 9FT ADLT (ELECTROSURGICAL) ×2
ELECTRODE REM PT RTRN 9FT ADLT (ELECTROSURGICAL) ×1 IMPLANT
GAUZE SPONGE 4X4 16PLY XRAY LF (GAUZE/BANDAGES/DRESSINGS) ×1 IMPLANT
GAUZE VASELINE 3X9 (GAUZE/BANDAGES/DRESSINGS) IMPLANT
GLOVE BIO SURGEON STRL SZ 6.5 (GLOVE) ×4 IMPLANT
GLOVE INDICATOR 7.0 STRL GRN (GLOVE) ×4 IMPLANT
GOWN STRL REUS W/ TWL LRG LVL3 (GOWN DISPOSABLE) ×1 IMPLANT
GOWN STRL REUS W/ TWL XL LVL3 (GOWN DISPOSABLE) ×2 IMPLANT
GOWN STRL REUS W/TWL LRG LVL3 (GOWN DISPOSABLE) ×2
GOWN STRL REUS W/TWL XL LVL3 (GOWN DISPOSABLE) ×4
HOOK RETRACTION 12 ELAST STAY (MISCELLANEOUS) IMPLANT
HYDROGEN PEROXIDE 16OZ (MISCELLANEOUS) ×2 IMPLANT
LEGGING LITHOTOMY PAIR STRL (DRAPES) IMPLANT
LOOP VESSEL MAXI BLUE (MISCELLANEOUS) IMPLANT
MANIFOLD NEPTUNE II (INSTRUMENTS) IMPLANT
NDL HYPO 25X1 1.5 SAFETY (NEEDLE) ×1 IMPLANT
NDL SAFETY ECLIPSE 18X1.5 (NEEDLE) IMPLANT
NEEDLE HYPO 18GX1.5 SHARP (NEEDLE)
NEEDLE HYPO 25X1 1.5 SAFETY (NEEDLE) ×2 IMPLANT
NS IRRIG 500ML POUR BTL (IV SOLUTION) ×2 IMPLANT
PACK BASIN DAY SURGERY FS (CUSTOM PROCEDURE TRAY) ×2 IMPLANT
PAD ABD 8X10 STRL (GAUZE/BANDAGES/DRESSINGS) ×2 IMPLANT
PAD ARMBOARD 7.5X6 YLW CONV (MISCELLANEOUS) IMPLANT
PENCIL BUTTON HOLSTER BLD 10FT (ELECTRODE) ×2 IMPLANT
RETRACTOR STERILE 25.8CMX11.3 (INSTRUMENTS) IMPLANT
SPONGE GAUZE 4X4 12PLY STER LF (GAUZE/BANDAGES/DRESSINGS) ×2 IMPLANT
SPONGE SURGIFOAM ABS GEL 12-7 (HEMOSTASIS) IMPLANT
SUCTION FRAZIER TIP 10 FR DISP (SUCTIONS) IMPLANT
SUT CHROMIC 2 0 SH (SUTURE) ×2 IMPLANT
SUT CHROMIC 3 0 SH 27 (SUTURE) IMPLANT
SUT ETHIBOND 0 (SUTURE) IMPLANT
SUT SILK 2 0 (SUTURE)
SUT SILK 2-0 18XBRD TIE 12 (SUTURE) IMPLANT
SUT VIC AB 3-0 SH 18 (SUTURE) IMPLANT
SUT VIC AB 4-0 P-3 18XBRD (SUTURE) IMPLANT
SUT VIC AB 4-0 P3 18 (SUTURE)
SYR CONTROL 10ML LL (SYRINGE) ×2 IMPLANT
TOWEL OR 17X24 6PK STRL BLUE (TOWEL DISPOSABLE) ×2 IMPLANT
TRAY DSU PREP LF (CUSTOM PROCEDURE TRAY) ×2 IMPLANT
TUBE CONNECTING 12X1/4 (SUCTIONS) ×2 IMPLANT
YANKAUER SUCT BULB TIP NO VENT (SUCTIONS) ×2 IMPLANT

## 2015-04-17 NOTE — Interval H&P Note (Signed)
History and Physical Interval Note:  04/17/2015 8:49 AM  Todd Elliott  has presented today for surgery, with the diagnosis of anal fistula  The various methods of treatment have been discussed with the patient and family. After consideration of risks, benefits and other options for treatment, the patient has consented to  Procedure(s): EXAM UNDER ANESTHESIA WITH ANAL FISTULOTOMY AND POSSIBLE INCISION AND DRAINAGE (N/A) as a surgical intervention .  The patient's history has been reviewed, patient examined, no change in status, stable for surgery.  I have reviewed the patient's chart and labs.  Questions were answered to the patient's satisfaction.     Rosario Adie, MD  [facility]

## 2015-04-17 NOTE — Op Note (Signed)
04/17/2015  9:57 AM  PATIENT:  Todd Elliott  59 y.o. male  Patient Care Team: Provider Not In System as PCP - General  PRE-OPERATIVE DIAGNOSIS:  anal fistula  POST-OPERATIVE DIAGNOSIS:  anal fistula  PROCEDURE:   EXAM UNDER ANESTHESIA WITH ANAL FISTULOTOMY  Surgeon(s): [name], MD  ASSISTANT: none   ANESTHESIA:   local and general  SPECIMEN:  No Specimen  DISPOSITION OF SPECIMEN:  N/A  COUNTS:  YES  PLAN OF CARE: Discharge to home after PACU  PATIENT DISPOSITION:  PACU - hemodynamically stable.  INDICATION: horseshoe fistula, failure of conservative management   OR FINDINGS: L posterior fistula to deep post anal space  DESCRIPTION: the patient was identified in the preoperative holding area and taken to the OR where they were laid on the operating room table.  General anesthesia was induced without difficulty. The patient was then positioned in prone jackknife position with buttocks gently taped apart.  The patient was then prepped and draped in usual sterile fashion.  SCDs were noted to be in place prior to the initiation of anesthesia. A surgical timeout was performed indicating the correct patient, procedure, positioning and need for preoperative antibiotics.  A rectal block was performed using Marcaine with epinephrine.    I began with a digital rectal exam.  The fistula tract could be seen in the left posterior perianal region.  I then placed a Hill-Ferguson anoscope into the anal canal and evaluated this completely.  I was able to get an S shaped fistula probe.  I was able to pass this through the internal opening in the deep post anal space.  A fistulotomy was performed.  The edges of the mucosa and sphincter were tacked to the fistula tract using a 2-0 Chromic suture.  Hemostasis was good.  The area was then infused with Exparel.  A dressing was applied.  The patient was awakened from anesthesia and sent to the PACU in stable condition.  All counts were correct  per OR staff.

## 2015-04-17 NOTE — Anesthesia Preprocedure Evaluation (Signed)
Anesthesia Evaluation  Patient identified by MRN, date of birth, ID band Patient awake    Reviewed: Allergy & Precautions, NPO status , Patient's Chart, lab work & pertinent test results  Airway Mallampati: II  TM Distance: >3 FB Neck ROM: Full    Dental no notable dental hx.    Pulmonary Current Smoker,  breath sounds clear to auscultation  Pulmonary exam normal       Cardiovascular negative cardio ROS Normal cardiovascular examRhythm:Regular Rate:Normal     Neuro/Psych negative neurological ROS  negative psych ROS   GI/Hepatic negative GI ROS, Neg liver ROS,   Endo/Other  Hypothyroidism   Renal/GU negative Renal ROS  negative genitourinary   Musculoskeletal negative musculoskeletal ROS (+)   Abdominal   Peds negative pediatric ROS (+)  Hematology negative hematology ROS (+)   Anesthesia Other Findings   Reproductive/Obstetrics negative OB ROS                             Anesthesia Physical Anesthesia Plan  ASA: II  Anesthesia Plan: General   Post-op Pain Management:    Induction: Intravenous  Airway Management Planned: Oral ETT  Additional Equipment:   Intra-op Plan:   Post-operative Plan: Extubation in OR  Informed Consent: I have reviewed the patients History and Physical, chart, labs and discussed the procedure including the risks, benefits and alternatives for the proposed anesthesia with the patient or authorized representative who has indicated his/her understanding and acceptance.   Dental advisory given  Plan Discussed with: CRNA and Surgeon  Anesthesia Plan Comments:         Anesthesia Quick Evaluation

## 2015-04-17 NOTE — Discharge Instructions (Addendum)

## 2015-04-17 NOTE — H&P (View-Only) (Signed)
Todd Elliott 03/20/2015 11:49 AM Location: [facility] Patient #: [id number] DOB: 07/23/1956 Married / Language: English / Race: White Male  History of Present Illness (Kelin Nixon MD; 03/20/2015 12:13 PM) Patient words: reck.  The patient is a 58 year old male who presents with anal fistula. 58-year-old male who presents to the office for follow-up of a perirectal fistula. He underwent a MRI recently to evaluate for this fistula. It shows that he continues to have communication with the rectum. The area has failed to heal with conservative management. The patient continues to have rectal drainage on a daily basis.   Problem List/Past Medical (Shakir Petrosino, MD; 03/20/2015 12:14 PM) ANAL FISTULA (565.1  K60.3) RECTAL ABSCESS (566  K61.1)  Past Surgical History (Evertte Sones, MD; 03/20/2015 12:14 PM) Resection of Small Bowel Gallbladder Surgery - Laparoscopic Laparoscopic Inguinal Hernia Surgery Right. ANAL FISTULA REPAIR (46706) Appendectomy Colon Polyp Removal - Colonoscopy  Allergies (Sonya Bynum, CMA; 03/20/2015 11:50 AM) No Known Drug Allergies09/16/2015  Medication History (Dominiq Fontaine, MD; 03/20/2015 12:14 PM) Multiple Vitamin (Oral) Active. Colace (100MG Capsule, Oral) Active. Centrum Silver Adult 50+ (Oral) Active. Pyridoxine HCl (100MG Tablet, Oral) Active. Levothyroxine Sodium (175MCG Tablet, Oral) Active. Medications Reconciled OxyCODONE HCl (Abuse Deter) (5MG Tablet, Oral as needed) Active.  Social History (Bayleigh Loflin, MD; 03/20/2015 12:14 PM) Alcohol use Occasional alcohol use. Illicit drug use Remotely quit drug use. Tobacco use Current some day smoker. No caffeine use  Family History (Anber Mckiver, MD; 03/20/2015 12:14 PM) Colon Cancer Father. Thyroid problems Father, Mother, Sister.  Review of Systems (Tesa Meadors MD; 03/20/2015 12:14 PM) General Present- Fatigue, Night Sweats and Weight Gain. Not Present- Appetite  Loss, Chills, Fever and Weight Loss. Skin Present- Non-Healing Wounds. Not Present- Change in Wart/Mole, Dryness, Hives, Jaundice, New Lesions, Rash and Ulcer. HEENT Present- Sinus Pain and Wears glasses/contact lenses. Not Present- Earache, Hearing Loss, Hoarseness, Nose Bleed, Oral Ulcers, Ringing in the Ears, Seasonal Allergies, Sore Throat, Visual Disturbances and Yellow Eyes. Breast Not Present- Breast Mass, Breast Pain, Nipple Discharge and Skin Changes. Cardiovascular Not Present- Chest Pain, Difficulty Breathing Lying Down, Leg Cramps, Palpitations, Rapid Heart Rate, Shortness of Breath and Swelling of Extremities. Gastrointestinal Present- Bloating, Bloody Stool and Hemorrhoids. Not Present- Abdominal Pain, Change in Bowel Habits, Chronic diarrhea, Constipation, Difficulty Swallowing, Excessive gas, Gets full quickly at meals, Indigestion, Nausea, Rectal Pain and Vomiting. Male Genitourinary Present- Frequency and Nocturia. Not Present- Blood in Urine, Change in Urinary Stream, Impotence, Painful Urination, Urgency and Urine Leakage. Musculoskeletal Present- Back Pain. Not Present- Joint Pain, Joint Stiffness, Muscle Pain, Muscle Weakness and Swelling of Extremities. Neurological Present- Headaches. Not Present- Decreased Memory, Fainting, Numbness, Seizures, Tingling, Tremor, Trouble walking and Weakness. Psychiatric Present- Anxiety and Change in Sleep Pattern. Not Present- Bipolar, Depression, Fearful and Frequent crying. Endocrine Present- Hair Changes. Not Present- Cold Intolerance, Excessive Hunger, Heat Intolerance and New Diabetes. Hematology Present- Persistent Infections. Not Present- Easy Bruising, Excessive bleeding, Gland problems and HIV.   Vitals (Sonya Bynum CMA; 03/20/2015 11:50 AM) 03/20/2015 11:49 AM Weight: 227 lb Height: 67in Body Surface Area: 2.21 m Body Mass Index: 35.55 kg/m Temp.: 97.1F(Temporal)  Pulse: 88 (Regular)  BP: 130/72 (Sitting, Left Arm,  Standard)    Physical Exam (Juniper Cobey MD; 03/20/2015 12:16 PM) General Mental Status-Alert. General Appearance-Cooperative.  Head and Neck Head-normocephalic, atraumatic with no lesions or palpable masses.  Eye Sclera/Conjunctiva - Bilateral-Normal.  Chest and Lung Exam Chest and lung exam reveals -quiet, even and easy respiratory effort   with no use of accessory muscles and on auscultation, normal breath sounds, no adventitious sounds and normal vocal resonance.  Cardiovascular Cardiovascular examination reveals -normal heart sounds, regular rate and rhythm with no murmurs.  Abdomen Palpation/Percussion Palpation and Percussion of the abdomen reveal - Soft and Non Tender.  Rectal Anorectal Exam External - Note: Fistula probe easily inserted into the cavity approximately 3-4 cm. No purulent drainage noted.    Assessment & Plan (Clea Dubach MD; 03/20/2015 12:13 PM) ANAL FISTULA (565.1  K60.3) Impression: We have attempted medical management of his perianal wound for approximately a year now. MRI shows no change in his anatomy. I have recommended a fistulotomy. We discussed that he may have some incontinence after surgery. We discussed that we could address that over time if needed. I do not think there are any other viable options to get this area to heal. 

## 2015-04-17 NOTE — Transfer of Care (Signed)
Immediate Anesthesia Transfer of Care Note  Patient: Todd Elliott  Procedure(s) Performed: Procedure(s): EXAM UNDER ANESTHESIA WITH ANAL FISTULOTOMY (N/A)  Patient Location: PACU  Anesthesia Type:General  Level of Consciousness: awake, alert , oriented and patient cooperative  Airway & Oxygen Therapy: Patient Spontanous Breathing and Patient connected to nasal cannula oxygen  Post-op Assessment: Report given to RN and Post -op Vital signs reviewed and stable  Post vital signs: Reviewed and stable  Last Vitals:  Filed Vitals:   04/17/15 0811  BP: 149/87  Pulse: 68  Temp: 36.6 C  Resp: 18    Complications: No apparent anesthesia complications

## 2015-04-17 NOTE — Anesthesia Postprocedure Evaluation (Signed)
  Anesthesia Post-op Note  Patient: Todd Elliott  Procedure(s) Performed: Procedure(s) (LRB): EXAM UNDER ANESTHESIA WITH ANAL FISTULOTOMY (N/A)  Patient Location: PACU  Anesthesia Type: General  Level of Consciousness: awake and alert   Airway and Oxygen Therapy: Patient Spontanous Breathing  Post-op Pain: mild  Post-op Assessment: Post-op Vital signs reviewed, Patient's Cardiovascular Status Stable, Respiratory Function Stable, Patent Airway and No signs of Nausea or vomiting  Last Vitals:  Filed Vitals:   04/17/15 1015  BP: 136/78  Pulse: 80  Temp:   Resp: 21    Post-op Vital Signs: stable   Complications: No apparent anesthesia complications

## 2015-04-17 NOTE — Anesthesia Procedure Notes (Signed)
Procedure Name: Intubation Date/Time: 04/17/2015 9:06 AM Performed by: Wanita Chamberlain Pre-anesthesia Checklist: Patient identified, Timeout performed, Emergency Drugs available, Suction available and Patient being monitored Patient Re-evaluated:Patient Re-evaluated prior to inductionOxygen Delivery Method: Circle system utilized Preoxygenation: Pre-oxygenation with 100% oxygen Intubation Type: IV induction Ventilation: Mask ventilation without difficulty Grade View: Grade I Tube type: Oral Tube size: 8.0 mm Number of attempts: 1 Airway Equipment and Method: Stylet Placement Confirmation: breath sounds checked- equal and bilateral,  ETT inserted through vocal cords under direct vision and positive ETCO2 Secured at: 22 cm Tube secured with: Tape Dental Injury: Teeth and Oropharynx as per pre-operative assessment

## 2015-04-18 ENCOUNTER — Encounter (HOSPITAL_BASED_OUTPATIENT_CLINIC_OR_DEPARTMENT_OTHER): Payer: Self-pay | Admitting: General Surgery

## 2015-07-14 ENCOUNTER — Encounter: Payer: Self-pay | Admitting: Gastroenterology

## 2016-11-30 ENCOUNTER — Encounter: Payer: Self-pay | Admitting: Gastroenterology

## 2018-08-30 ENCOUNTER — Other Ambulatory Visit (HOSPITAL_COMMUNITY): Payer: Self-pay | Admitting: Pharmacist

## 2018-08-30 ENCOUNTER — Other Ambulatory Visit (HOSPITAL_COMMUNITY): Payer: Self-pay

## 2018-08-30 DIAGNOSIS — I509 Heart failure, unspecified: Secondary | ICD-10-CM

## 2018-09-07 ENCOUNTER — Other Ambulatory Visit (HOSPITAL_COMMUNITY): Payer: PRIVATE HEALTH INSURANCE

## 2018-09-11 ENCOUNTER — Ambulatory Visit (HOSPITAL_COMMUNITY)
Admission: RE | Admit: 2018-09-11 | Discharge: 2018-09-11 | Disposition: A | Discharge status: Home / Self Care | Payer: Commercial Managed Care - PPO | Source: Ambulatory Visit | Attending: Internal Medicine | Admitting: Internal Medicine

## 2018-09-11 DIAGNOSIS — I509 Heart failure, unspecified: Secondary | ICD-10-CM | POA: Diagnosis not present

## 2018-09-11 DIAGNOSIS — I871 Compression of vein: Secondary | ICD-10-CM | POA: Diagnosis not present

## 2018-09-11 DIAGNOSIS — I110 Hypertensive heart disease with heart failure: Secondary | ICD-10-CM | POA: Insufficient documentation

## 2018-09-11 NOTE — Progress Notes (Signed)
  Echocardiogram 2D Echocardiogram has been performed.  Jannett Celestine 09/11/2018, 1:27 PM

## 2021-03-09 ENCOUNTER — Emergency Department (HOSPITAL_COMMUNITY)
Admission: EM | Admit: 2021-03-09 | Discharge: 2021-03-09 | Disposition: A | Discharge status: Home / Self Care | Payer: Self-pay | Attending: Emergency Medicine | Admitting: Emergency Medicine

## 2021-03-09 ENCOUNTER — Encounter (HOSPITAL_COMMUNITY): Payer: Self-pay

## 2021-03-09 ENCOUNTER — Emergency Department (HOSPITAL_COMMUNITY): Payer: Self-pay

## 2021-03-09 ENCOUNTER — Other Ambulatory Visit: Payer: Self-pay

## 2021-03-09 DIAGNOSIS — Z5321 Procedure and treatment not carried out due to patient leaving prior to being seen by health care provider: Secondary | ICD-10-CM | POA: Insufficient documentation

## 2021-03-09 DIAGNOSIS — R112 Nausea with vomiting, unspecified: Secondary | ICD-10-CM | POA: Insufficient documentation

## 2021-03-09 DIAGNOSIS — R202 Paresthesia of skin: Secondary | ICD-10-CM | POA: Insufficient documentation

## 2021-03-09 DIAGNOSIS — H532 Diplopia: Secondary | ICD-10-CM | POA: Insufficient documentation

## 2021-03-09 DIAGNOSIS — R791 Abnormal coagulation profile: Secondary | ICD-10-CM | POA: Insufficient documentation

## 2021-03-09 LAB — COMPREHENSIVE METABOLIC PANEL
ALT: 22 U/L (ref 0–44)
AST: 19 U/L (ref 15–41)
Albumin: 3.7 g/dL (ref 3.5–5.0)
Alkaline Phosphatase: 67 U/L (ref 38–126)
Anion gap: 7 (ref 5–15)
BUN: 12 mg/dL (ref 8–23)
CO2: 28 mmol/L (ref 22–32)
Calcium: 9.4 mg/dL (ref 8.9–10.3)
Chloride: 105 mmol/L (ref 98–111)
Creatinine, Ser: 1.09 mg/dL (ref 0.61–1.24)
GFR, Estimated: >60 mL/min (ref >60)
Glucose, Bld: 120 mg/dL — ABNORMAL HIGH (ref 70–99)
Potassium: 4.1 mmol/L (ref 3.5–5.1)
Sodium: 140 mmol/L (ref 135–145)
Total Bilirubin: 0.9 mg/dL (ref 0.3–1.2)
Total Protein: 6.7 g/dL (ref 6.5–8.1)

## 2021-03-09 LAB — DIFFERENTIAL
Abs Immature Granulocytes: 0.02 10*3/uL (ref 0.00–0.07)
Basophils Absolute: 0.1 10*3/uL (ref 0.0–0.1)
Basophils Relative: 1 %
Eosinophils Absolute: 0.1 10*3/uL (ref 0.0–0.5)
Eosinophils Relative: 1 %
Immature Granulocytes: 0 %
Lymphocytes Relative: 28 %
Lymphs Abs: 2 10*3/uL (ref 0.7–4.0)
Monocytes Absolute: 0.5 10*3/uL (ref 0.1–1.0)
Monocytes Relative: 7 %
Neutro Abs: 4.7 10*3/uL (ref 1.7–7.7)
Neutrophils Relative %: 63 %

## 2021-03-09 LAB — PROTIME-INR
INR: 1 (ref 0.8–1.2)
Prothrombin Time: 13.2 seconds (ref 11.4–15.2)

## 2021-03-09 LAB — CBC
HCT: 52.4 % — ABNORMAL HIGH (ref 39.0–52.0)
Hemoglobin: 16.9 g/dL (ref 13.0–17.0)
MCH: 29.3 pg (ref 26.0–34.0)
MCHC: 32.3 g/dL (ref 30.0–36.0)
MCV: 91 fL (ref 80.0–100.0)
Platelets: 184 10*3/uL (ref 150–400)
RBC: 5.76 MIL/uL (ref 4.22–5.81)
RDW: 12 % (ref 11.5–15.5)
WBC: 7.3 10*3/uL (ref 4.0–10.5)
nRBC: 0 % (ref 0.0–0.2)

## 2021-03-09 LAB — APTT: aPTT: 28 seconds (ref 24–36)

## 2021-03-09 MED ORDER — SODIUM CHLORIDE 0.9% FLUSH: 3.0000 mL | Freq: Once | INTRAVENOUS | Status: DC

## 2021-03-09 NOTE — ED Provider Notes (Signed)
Emergency Medicine Provider Triage Evaluation Note  Todd Elliott , a 65 y.o. male  was evaluated in triage.  Pt complains of double vision.  Patient states he has had left upper and lower extremity numbness since January.  Last week he had 2 days of nausea and vomiting, and afterwards, he developed double vision.  This has been persistent for the past week.  Saw an ophthalmologist today who recommended he come to the ER.  He states his double vision resolves when he closes 1 eye.  He denies new numbness or weakness since the double vision.  Review of Systems  Positive: Double vision, L sided numbness Negative: cp  Physical Exam  BP (!) 151/85   Pulse 83   Temp 97.7 F (36.5 C)   Resp (!) 26   SpO2 96%  Gen:   Awake, no distress   Resp:  Normal effort  MSK:   Moves extremities without difficulty.  Strength of upper and lower extremities equal bilaterally. Other:  CN intact.  Visual fields grossly intact.  Decree sensation of the left upper and lower extremity.  Decree sensation of the left side of the face.  Medical Decision Making  Medically screening exam initiated at 11:02 AM.  Appropriate orders placed.  [name] was informed that the remainder of the evaluation will be completed by another provider, this initial triage assessment does not replace that evaluation, and the importance of remaining in the ED until their evaluation is complete.  Patient outside of any window for tPA.  No code stroke or LVO stroke called.  However will obtain stroke work up   Franchot Heidelberg, PA-C 03/09/21 1103    Daleen Bo, MD 03/10/21 [phone]

## 2021-03-09 NOTE — ED Notes (Signed)
Patient decided to leave.   

## 2021-03-09 NOTE — ED Triage Notes (Signed)
Patient complains of double vision and left side numbness since January. Was never seen by MD at that time. Today went to eye doctor and they sent him here for further evaluation. Patient alert and oriented.

## 2021-05-29 DIAGNOSIS — J449 Chronic obstructive pulmonary disease, unspecified: Secondary | ICD-10-CM | POA: Insufficient documentation

## 2021-06-04 ENCOUNTER — Other Ambulatory Visit: Payer: Self-pay | Admitting: Family Medicine

## 2021-06-04 DIAGNOSIS — G629 Polyneuropathy, unspecified: Secondary | ICD-10-CM

## 2021-06-28 ENCOUNTER — Other Ambulatory Visit: Payer: Self-pay

## 2021-06-28 ENCOUNTER — Ambulatory Visit
Admission: RE | Admit: 2021-06-28 | Discharge: 2021-06-28 | Disposition: A | Discharge status: Home / Self Care | Payer: Self-pay | Source: Ambulatory Visit | Attending: Family Medicine | Admitting: Family Medicine

## 2021-06-28 DIAGNOSIS — G629 Polyneuropathy, unspecified: Secondary | ICD-10-CM

## 2021-09-28 ENCOUNTER — Encounter: Payer: Self-pay | Admitting: Neurology

## 2021-09-28 ENCOUNTER — Ambulatory Visit: Payer: Medicare HMO | Admitting: Neurology

## 2021-09-28 VITALS — BP 147/92 | HR 81 | Ht 66.5 in | Wt 251.0 lb

## 2021-09-28 DIAGNOSIS — M7989 Other specified soft tissue disorders: Secondary | ICD-10-CM

## 2021-09-28 DIAGNOSIS — R202 Paresthesia of skin: Secondary | ICD-10-CM | POA: Diagnosis not present

## 2021-09-28 DIAGNOSIS — R269 Unspecified abnormalities of gait and mobility: Secondary | ICD-10-CM | POA: Diagnosis not present

## 2021-09-28 MED ORDER — CLOPIDOGREL BISULFATE 75 MG PO TABS: 75.0000 mg | ORAL_TABLET | Freq: Every day | ORAL | 11 refills | Status: DC

## 2021-09-28 NOTE — Patient Instructions (Addendum)
Stop ASA  

## 2021-09-28 NOTE — Progress Notes (Addendum)
Chief Complaint  Patient presents with   New Patient (Initial Visit)    Room 14 with wife, Laurene. Reports numbness on left side for around ten months. He has not tried any oral medications. MRI brain pending 10/15/21 at [facility] (says he will be completing it under anesthesia due severe claustrophobia).       ASSESSMENT AND PLAN  Todd Elliott is a 66 y.o. 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   MEDICAL HISTORY:  Todd Elliott is a 66 year old male, seen in request by his primary care doctor Lazoff, Waldemar Dickens, for evaluation of left sided numbness, initial evaluation was on September 28, 2021.  I reviewed and summarized the referring note. PMHX HTN Hypothyroidism HLD Smoke 1ppd x 50 years. Obesity Snoring,   In 2022, he woke up 1 day noticed dense numbness from left side head, face, left upper extremity, left lower extremity, some subjective weakness, numbness has been persistent since its onset  He attempted MRI once in November 2022 without success due to severe claustrophobia, MRI under general anesthesia is pending in March 2023 ordered by his primary care physician  Prior to the event, he is already on aspirin 81 mg daily,  He also complains few months history of tender left lower lower  extremity, Sweating  He also has mild snoring, obesity, COPD, was seen by pulmonologist, suggest sleep study, he declined at this point  PHYSICAL EXAM:   Vitals:   09/28/21 1511  BP: (!) 147/92  Pulse: 81  Weight: 251 lb (113.9 kg)  Height: 5' 6.5" (1.689 m)   Not recorded     Body mass index is 39.91 kg/m.  PHYSICAL EXAMNIATION:  Gen: NAD, conversant, well nourised, well groomed                     Cardiovascular: Regular rate rhythm, no peripheral edema, warm, nontender. Eyes: Conjunctivae clear without exudates or hemorrhage Neck: Supple, no carotid bruits. Pulmonary: Clear to auscultation bilaterally   NEUROLOGICAL EXAM:  MENTAL STATUS: Speech:    Speech is normal; fluent and spontaneous with normal comprehension.  Cognition:     Orientation to time, place and person     Normal recent and remote memory     Normal Attention span and concentration     Normal Language, naming, repeating,spontaneous speech     Fund of knowledge   CRANIAL NERVES: CN II: Visual fields are full to confrontation. Pupils are round equal and briskly reactive to light. CN III, IV, VI: extraocular movement are normal. No ptosis. CN V: Facial sensation is intact to light touch CN VII: Face is symmetric with normal eye closure  CN VIII: Hearing is normal to causal conversation. CN IX, X: Phonation is normal. CN XI: Head turning and shoulder shrug are intact  MOTOR: There is no pronator drift of out-stretched arms. Muscle bulk  and tone are normal. Muscle strength is normal.  REFLEXES: Reflexes are 2+ and symmetric at the biceps, triceps, knees, and ankles. Plantar responses are flexor.  SENSORY: Intact to light touch, pinprick and vibratory sensation are intact in fingers and toes.  With exception of decreased light touch on the left side  COORDINATION: There is no trunk or limb dysmetria noted.  GAIT/STANCE: Need push-up to get up from sitting position, wide-based, cautious, limited  by his big body habitus  REVIEW OF SYSTEMS:  Full 14 system review of systems performed and notable only for as above All other review of systems were negative.   ALLERGIES: No Known Allergies  HOME MEDICATIONS: Current Outpatient Medications  Medication Sig Dispense Refill   albuterol (VENTOLIN HFA) 108 (90 Base) MCG/ACT inhaler Inhale 1-2 puffs into the lungs as needed. Every 4-6 hours as needed.     amLODipine (NORVASC) 5 MG tablet Take 5 mg by mouth daily.     aspirin 81 MG EC tablet Take 1 tablet by mouth daily.     levothyroxine (SYNTHROID, LEVOTHROID) 175 MCG tablet Take 175 mcg by mouth as directed. Take 1.5 tabs on Mondays & Fridays. Take 1 tab daily all other days.     lisinopril (ZESTRIL) 40 MG tablet Take 40 mg by mouth daily.     rosuvastatin (CRESTOR) 10 MG tablet Take 10 mg by mouth daily.     Tiotropium Bromide-Olodaterol (STIOLTO RESPIMAT) 2.5-2.5 MCG/ACT AERS Inhale 2 puffs into the lungs daily.     vitamin B-12 (CYANOCOBALAMIN) 1000 MCG tablet Take 1,000 mcg by mouth daily.     VITAMIN D PO Take 25 mcg by mouth daily.     No current facility-administered medications for this visit.    PAST MEDICAL HISTORY: Past Medical History:  Diagnosis Date   Anal fistula    CHRONIC   Hemorrhoid    Hypercholesterolemia    Hypertension    Hypothyroidism    Left sided numbness     PAST SURGICAL HISTORY: Past Surgical History:  Procedure Laterality Date   ANAL FISTULOTOMY N/A 01/30/2014   Procedure: ANAL FISTULOTOMY WITH DRAIN PLACEMENT;  Surgeon: [name], MD;  Location: [place];  Service: General;  Laterality: N/A;   CHOLECYSTECTOMY  2013   COLONOSCOPY     EVALUATION UNDER ANESTHESIA WITH ANAL FISTULECTOMY N/A 06/26/2014   Procedure: ANAL EXAM UNDER ANESTHESIA ;  Surgeon: [name], MD;  Location: [place];  Service: General;  Laterality: N/A;   EVALUATION UNDER ANESTHESIA WITH ANAL FISTULECTOMY N/A 04/17/2015   Procedure:  EXAM UNDER ANESTHESIA WITH ANAL FISTULOTOMY;  Surgeon: [name], MD;  Location: [place];  Service: General;  Laterality: N/A;   EXAMINATION UNDER ANESTHESIA N/A 01/30/2014   Procedure: EXAM UNDER ANESTHESIA;  Surgeon: [name], MD;  Location: [place];  Service: General;  Laterality: N/A;   INCISION AND DRAINAGE PERIRECTAL ABSCESS Left 12/27/2013   Procedure: IRRIGATION AND DEBRIDEMENT PERIRECTAL ABSCESS;  Surgeon: Edward Jolly, MD;  Location: [facility];  Service: General;  Laterality: Left;   INCISION AND DRAINAGE PERIRECTAL ABSCESS N/A 02/10/2014   Procedure: IRRIGATION AND DEBRIDEMENT PERIRECTAL ABSCESS;  Surgeon: [name], MD;  Location: [place];  Service: General;  Laterality: N/A;   INCISION AND DRAINAGE PERIRECTAL ABSCESS  06/26/2014   Procedure: IRRIGATION AND DEBRIDEMENT PERIRECTAL ABSCESS;  Surgeon: [name], MD;  Location: [place];  Service: General;;   LAPAROSOPCY CONVERTED TO OPEN ILEOCECOSTOMY FOR GANGRENOUS RUPTURED  APPENDIX  08-11-2005   ORIF LEFT SCAPHOID FX MID SHAFT  12-27-2003   ORIF RADIUS FX RIGHT MID SHAFT/ CLOSED REDUCTION RADIUS AND ULNAR DISLOCATION WRIST/  I & D AND CLOSURE  11-21-2003   VENTRAL HERNIA REPAIR  2013    FAMILY HISTORY: Family History  Problem Relation Age of Onset   Thyroid disease Mother    Thyroid disease Father    Colon cancer Father 27   Cancer Father        colon   Thyroid disease Sister    Thyroid disease Sister    Heart disease Maternal Grandfather    Cancer Maternal Grandfather        lymphoma   Diabetes Maternal Aunt        x2   Diabetes Maternal Uncle    Diabetes Other        Mat great GM    SOCIAL HISTORY: Social History   Socioeconomic History   Marital status: Married    Spouse name: Laurene   Number of children: 3   Years of education: 12   Highest education level: High school graduate  Occupational History   Occupation: Retired  Tobacco Use    Smoking status: Every Day    Packs/day: 0.25    Years: 35.00    Pack years: 8.75    Types: Cigarettes   Smokeless tobacco: Never  Substance and Sexual Activity   Alcohol use: Yes    Comment: occasional use   Drug use: No   Sexual activity: Not on file  Other Topics Concern   Not on file  Social History Narrative   Lives at home with his wife.   Right-handed.   Caffeine use: 4 cups per day.   Social Determinants of Health   Financial Resource Strain: Not on file  Food Insecurity: Not on file  Transportation Needs: Not on file  Physical Activity: Not on file  Stress: Not on file  Social Connections: Not on file  Intimate Partner Violence: Not on file      Todd Elliott, M.D. Ph.D.  [facility] [address] [place], [place] [address] Ph: [phone] Fax: [phone]  CC:  [name], DO [address]  [place] [address]  Lazoff, Shawn P, DO

## 2021-10-13 ENCOUNTER — Ambulatory Visit (HOSPITAL_BASED_OUTPATIENT_CLINIC_OR_DEPARTMENT_OTHER)
Admission: RE | Admit: 2021-10-13 | Discharge: 2021-10-13 | Disposition: A | Discharge status: Home / Self Care | Payer: Medicare HMO | Source: Ambulatory Visit | Attending: Neurology | Admitting: Neurology

## 2021-10-13 ENCOUNTER — Other Ambulatory Visit: Payer: Self-pay

## 2021-10-13 ENCOUNTER — Ambulatory Visit (HOSPITAL_COMMUNITY)
Admission: RE | Admit: 2021-10-13 | Discharge: 2021-10-13 | Disposition: A | Discharge status: Home / Self Care | Payer: Medicare HMO | Source: Ambulatory Visit | Attending: Neurology | Admitting: Neurology

## 2021-10-13 DIAGNOSIS — R269 Unspecified abnormalities of gait and mobility: Secondary | ICD-10-CM

## 2021-10-13 DIAGNOSIS — I10 Essential (primary) hypertension: Secondary | ICD-10-CM | POA: Insufficient documentation

## 2021-10-13 DIAGNOSIS — R202 Paresthesia of skin: Secondary | ICD-10-CM

## 2021-10-13 DIAGNOSIS — M7989 Other specified soft tissue disorders: Secondary | ICD-10-CM | POA: Diagnosis not present

## 2021-10-13 LAB — ECHOCARDIOGRAM COMPLETE
Area-P 1/2: 3.27 cm2
S' Lateral: 3 cm
Single Plane A4C EF: 68.3 %

## 2021-10-13 NOTE — Progress Notes (Signed)
Lower extremity venous has been completed.   Preliminary results in CV Proc.   Todd Elliott Todd Elliott 10/13/2021 10:20 AM

## 2021-10-20 ENCOUNTER — Telehealth: Payer: Self-pay

## 2021-10-20 NOTE — Telephone Encounter (Signed)
NOTES SCANNED TO REFERRAL 

## 2021-10-25 NOTE — Progress Notes (Unsigned)
Cardiology Office Note:    Date:  10/25/2021   ID:  ADOLPHO MEENACH, DOB 1956/02/08, MRN [id number]  PCP:  [name], DO   [facility] Providers Cardiologist:  [name], MD Referring MD: [name], DO   Chief Complaint/Reason for Referral: History of stroke  ASSESSMENT:    Cerebrovascular accident (CVA), unspecified mechanism ([place])  Hypertension, unspecified type  Hyperlipidemia, unspecified hyperlipidemia type  Chronic obstructive pulmonary disease, unspecified COPD type ([place])  Morbid obesity due to excess calories ([place])  PLAN:    In order of problems listed above:  1.  I have reviewed the patient's echocardiogram.  A bubble study was not performed.  We will refer for bubble study echocardiogram.  He has no evidence of LV thrombus and a normal ejection fraction.  We will plan to place a 2-week monitor on to evaluate for occult atrial fibrillation.  Will refer for carotid ultrasound.  If he does have an atrial level shunt we may proceed to a TEE to evaluate further.  Follow-up in 6 months or earlier if needed. 2.  Hypertension 3.  Hyperlipidemia 4.  Continue current therapy. 5.  Consider referral to pharmacy for further recommendations regarding pharmacotherapy.        {Are you ordering a CV Procedure (e.g. stress test, cath, DCCV, TEE, etc)?   Press F2        :[id number]}   Dispo:  No follow-ups on file.     Medication Adjustments/Labs and Tests Ordered: Current medicines are reviewed at length with the patient today.  Concerns regarding medicines are outlined above.   Tests Ordered: No orders of the defined types were placed in this encounter.   Medication Changes: No orders of the defined types were placed in this encounter.   History of Present Illness:    FOCUSED PROBLEM LIST:   1.  History of prior stroke with MRI March 2023 demonstrating chronic ischemic microvascular disease and multiple prior infarcts including bilateral pons,  bilateral thalami, and cerebral white matter 2.  Dyslipidemia 3.  Hypertension 4.  Severe COPD 5.  Struct of sleep apnea 6.  Ongoing tobacco abuse 7.  BMI of 39.9 kg/m  The patient is a 66 y.o. male with the indicated medical history here for recommendations regarding MRI findings.  He was recently seen by neurology.  Multiple imaging tests were ordered.  He recently had his MRI under general anesthesia due to claustrophobia.  I reviewed those findings as detailed above.  He was started on Plavix by neurology as well.  A sleep study was offered at the patient declined.        Current Medications: No outpatient medications have been marked as taking for the 10/27/21 encounter (Appointment) with Early Osmond, MD.     Allergies:    Patient has no known allergies.   Social History:   Social History   Tobacco Use   Smoking status: Every Day    Packs/day: 0.25    Years: 35.00    Pack years: 8.75    Types: Cigarettes   Smokeless tobacco: Never  Substance Use Topics   Alcohol use: Yes    Comment: occasional use   Drug use: No     Family Hx: Family History  Problem Relation Age of Onset   Thyroid disease Mother    Thyroid disease Father    Colon cancer Father 73   Cancer Father        colon   Thyroid disease Sister  Thyroid disease Sister    Heart disease Maternal Grandfather    Cancer Maternal Grandfather        lymphoma   Diabetes Maternal Aunt        x2   Diabetes Maternal Uncle    Diabetes Other        Mat great GM     Review of Systems:   Please see the history of present illness.    All other systems reviewed and are negative.     EKGs/Labs/Other Test Reviewed:    EKG:  ***  Prior CV studies: TTE 2/23 (no bubble study)  1. Left ventricular ejection fraction, by estimation, is 60 to 65%. Left  ventricular ejection fraction by 3D volume is 58 %. The left ventricle has  normal function. The left ventricle has no regional wall motion   abnormalities. Left ventricular diastolic   parameters are consistent with Grade I diastolic dysfunction (impaired  relaxation).   2. Right ventricular systolic function is normal. The right ventricular  size is normal.   3. The mitral valve is normal in structure. No evidence of mitral valve  regurgitation. No evidence of mitral stenosis.   4. The aortic valve is normal in structure. Aortic valve regurgitation is  not visualized. No aortic stenosis is present.   5. The inferior vena cava is normal in size with greater than 50%  respiratory variability, suggesting right atrial pressure of 3 mmHg.  2/23 LE U/S negative  Imaging studies that I have independently reviewed today: Echocardiogram and lower extremity ultrasound  Recent Labs: 03/09/2021: ALT 22; BUN 12; Creatinine, Ser 1.09; Hemoglobin 16.9; Platelets 184; Potassium 4.1; Sodium 140   Recent Lipid Panel No results found for: CHOL, TRIG, HDL, LDLCALC, LDLDIRECT  Risk Assessment/Calculations:    {Does this patient have ATRIAL FIBRILLATION?:[id number]}      Physical Exam:    VS:  There were no vitals taken for this visit.   Wt Readings from Last 3 Encounters:  09/28/21 251 lb (113.9 kg)  04/17/15 220 lb (99.8 kg)  06/26/14 229 lb 8 oz (104.1 kg)    GENERAL:  No apparent distress, AOx3 HEENT:  No carotid bruits, +2 carotid impulses, no scleral icterus CAR: RRR Irregular RR*** no murmurs***, gallops, rubs, or thrills RES:  Clear to auscultation bilaterally ABD:  Soft, nontender, nondistended, positive bowel sounds x 4 VASC:  +2 radial pulses, +2 carotid pulses, palpable pedal pulses NEURO:  CN 2-12 grossly intact; motor and sensory grossly intact PSYCH:  No active depression or anxiety EXT:  No edema, ecchymosis, or cyanosis  Signed, Early Osmond, MD  10/25/2021 8:45 AM    [address], [place], [place]  [address] Phone: [phone]; Fax: [phone]   Note:  This  document was prepared using Dragon voice recognition software and may include unintentional dictation errors.

## 2021-10-27 ENCOUNTER — Ambulatory Visit: Payer: Medicare HMO | Admitting: Internal Medicine

## 2021-10-27 ENCOUNTER — Encounter: Payer: Self-pay | Admitting: Internal Medicine

## 2021-10-27 ENCOUNTER — Other Ambulatory Visit: Payer: Self-pay

## 2021-10-27 ENCOUNTER — Other Ambulatory Visit: Payer: Self-pay | Admitting: Internal Medicine

## 2021-10-27 ENCOUNTER — Encounter: Payer: Self-pay | Admitting: Radiology

## 2021-10-27 VITALS — BP 122/88 | HR 87 | Ht 66.5 in | Wt 245.0 lb

## 2021-10-27 DIAGNOSIS — I4891 Unspecified atrial fibrillation: Secondary | ICD-10-CM

## 2021-10-27 DIAGNOSIS — I10 Essential (primary) hypertension: Secondary | ICD-10-CM

## 2021-10-27 DIAGNOSIS — I639 Cerebral infarction, unspecified: Secondary | ICD-10-CM

## 2021-10-27 DIAGNOSIS — J449 Chronic obstructive pulmonary disease, unspecified: Secondary | ICD-10-CM | POA: Diagnosis not present

## 2021-10-27 DIAGNOSIS — E785 Hyperlipidemia, unspecified: Secondary | ICD-10-CM | POA: Diagnosis not present

## 2021-10-27 NOTE — Patient Instructions (Signed)
Medication Instructions:  ?Your physician recommends that you continue on your current medications as directed. Please refer to the Current Medication list given to you today. ? ?*If you need a refill on your cardiac medications before your next appointment, please call your pharmacy* ? ? ?Lab Work: ?None ordered  ? ?If you have labs (blood work) drawn today and your tests are completely normal, you will receive your results only by: ?MyChart Message (if you have MyChart) OR ?A paper copy in the mail ?If you have any lab test that is abnormal or we need to change your treatment, we will call you to review the results. ? ? ?Testing/Procedures: ?Your physician has requested that you have a bubble echocardiogram. Echocardiography is a painless test that uses sound waves to create images of your heart. It provides your doctor with information about the size and shape of your heart and how well your heart?s chambers and valves are working. This procedure takes approximately one hour. There are no restrictions for this procedure. ? ?Your physician has requested that you have a carotid duplex. This test is an ultrasound of the carotid arteries in your neck. It looks at blood flow through these arteries that supply the brain with blood. Allow one hour for this exam. There are no restrictions or special instructions. ? ?Your physician has recommended that you wear an event monitor. Event monitors are medical devices that record the heart?s electrical activity. Doctors most often [place] these monitors to diagnose arrhythmias. Arrhythmias are problems with the speed or rhythm of the heartbeat. The monitor is a small, portable device. You can wear one while you do your normal daily activities. This is usually used to diagnose what is causing palpitations/syncope (passing out). ? ? ?Follow-Up: ?At [facility], you and your health needs are our priority.  As part of our continuing mission to provide you with exceptional heart  care, we have created designated Provider Care Teams.  These Care Teams include your primary Cardiologist (physician) and Advanced Practice Providers (APPs -  Physician Assistants and Nurse Practitioners) who all work together to provide you with the care you need, when you need it. ? ?We recommend signing up for the patient portal called "MyChart".  Sign up information is provided on this After Visit Summary.  MyChart is used to connect with patients for Virtual Visits (Telemedicine).  Patients are able to view lab/test results, encounter notes, upcoming appointments, etc.  Non-urgent messages can be sent to your provider as well.   ?To learn more about what you can do with MyChart, go to [email or web address].   ? ?Your next appointment:   ?2 month(s) ? ?The format for your next appointment:   ?In Person ? ?Provider:   ?Dr. [name]  ? ? ?Other Instructions ?None   ?

## 2021-10-27 NOTE — Progress Notes (Signed)
Enrolled patient for a 30 day [employer] Event Monitor to be mailed to patients home  

## 2021-11-05 ENCOUNTER — Telehealth: Payer: Self-pay | Admitting: *Deleted

## 2021-11-05 NOTE — Telephone Encounter (Signed)
? ?  Pt's spouse calling, they need help to show how to wear the heart monitor. They are asking if they can stopped by at the office after the pt's echo bubble tomorrow  ?

## 2021-11-06 ENCOUNTER — Other Ambulatory Visit: Payer: Self-pay

## 2021-11-06 ENCOUNTER — Ambulatory Visit (INDEPENDENT_AMBULATORY_CARE_PROVIDER_SITE_OTHER): Payer: Medicare HMO

## 2021-11-06 ENCOUNTER — Ambulatory Visit (HOSPITAL_COMMUNITY): Payer: Medicare HMO | Attending: Internal Medicine

## 2021-11-06 DIAGNOSIS — I6389 Other cerebral infarction: Secondary | ICD-10-CM

## 2021-11-06 DIAGNOSIS — I4891 Unspecified atrial fibrillation: Secondary | ICD-10-CM | POA: Diagnosis not present

## 2021-11-06 DIAGNOSIS — I639 Cerebral infarction, unspecified: Secondary | ICD-10-CM

## 2021-11-06 LAB — ECHOCARDIOGRAM LIMITED BUBBLE STUDY
Area-P 1/2: 2.86 cm2
S' Lateral: 2.6 cm

## 2021-11-06 MED ORDER — PERFLUTREN LIPID MICROSPHERE
1.0000 mL | INTRAVENOUS | Status: AC | PRN
  Administered 2021-11-06: 3 mL via INTRAVENOUS

## 2021-11-06 NOTE — Telephone Encounter (Signed)
Scheduled patient to have Event placed after echo on 3/24 ?

## 2021-11-13 ENCOUNTER — Ambulatory Visit (HOSPITAL_COMMUNITY)
Admission: RE | Admit: 2021-11-13 | Discharge: 2021-11-13 | Disposition: A | Discharge status: Home / Self Care | Payer: Medicare HMO | Source: Ambulatory Visit | Attending: Cardiovascular Disease | Admitting: Cardiovascular Disease

## 2021-11-13 DIAGNOSIS — I6389 Other cerebral infarction: Secondary | ICD-10-CM

## 2021-11-13 DIAGNOSIS — I639 Cerebral infarction, unspecified: Secondary | ICD-10-CM | POA: Insufficient documentation

## 2021-12-02 ENCOUNTER — Telehealth: Payer: Self-pay

## 2021-12-02 NOTE — Telephone Encounter (Signed)
? ?  Cardiac Monitor Alert ? ?Date of alert:  12/02/2021  ? ?Patient Name: Todd Elliott  ?DOB: 07-04-1956  ?MRN: [id number]  ? ?[facility] Cardiologist: Ali Lowe ? ?Monitor Information: ?Cardiac Event Monitor [[employer]]  ?Reason:  cryptogenic stroke ?Ordering provider:  Ali Lowe ?{ ?Alert ?NSVT ?This is the 1st alert for this rhythm.  ? ?Next Cardiology Appointment   ?Date:  Jan 08, 2022  Provider:  Ali Lowe ? ?The patient was contacted today.  He is asymptomatic. ?Arrhythmia, symptoms and history reviewed with DOD.   ?Plan:  Continue to monitor  ? ? ?Damian Leavell, RN  ?12/02/2021 2:51 PM   ?

## 2021-12-04 NOTE — Telephone Encounter (Signed)
I reviewed the patient's monitor which demonstrated nonsustained ventricular tachycardia.  The patient was asymptomatic.  This lasted 6 seconds.  We will address at next visit. ?

## 2022-01-05 NOTE — Progress Notes (Signed)
Cardiology Office Note:    Date:  01/08/2022   ID:  Todd Elliott, DOB 1956/07/28, MRN [id number]  PCP:  [name], DO   [facility] Providers Cardiologist:  [name], MD Referring MD: [name], DO   Chief Complaint/Reason for Referral: History of stroke  ASSESSMENT:    Cerebrovascular accident (CVA), unspecified mechanism (Todd Elliott)  Aortic atherosclerosis (Todd Elliott) - Plan: Hepatic function panel, Lipid panel, Lipoprotein A (LPA)  Hypertension, unspecified type  Hyperlipidemia, unspecified hyperlipidemia type - Plan: Hepatic function panel, Lipid panel, Lipoprotein A (LPA)  BMI 39.0-39.9,adult  Chronic obstructive pulmonary disease, unspecified COPD type ([place])  Edema, unspecified type    PLAN:    In order of problems listed above:  1.  CVA: There does not seem to be a cardiac etiology for the patient's strokes other than hypertension.  No intracardiac shunt or LV thrombus was noted and the patient had a reassuring 30-day monitor which demonstrated no atrial fibrillation.  We will continue to control cardiovascular risk factors.  He is on Plavix monotherapy per neurology.  Follow-up in 3 months or earlier if needed. 2.  Aortic atherosclerosis: Continue Plavix in lieu of aspirin and statin with strict blood pressure control. 3.  Hypertension: His blood pressure is well controlled on his current regimen. 4.  Hyperlipidemia: LDL January 2023 in [place] was 107.  We will increase Crestor to 20 mg a lipid panel, LFTs, and LP(a) in 2 months. 5.  Elevated BMI: We consider referral to pharmacy for recommendations in the future. 6.  COPD: This is being followed by other providers.  He unfortunately continues to smoke. 7.  Edema: The patient has not been taking his daily Lasix 20 mg.  We will increase this to 20 mg twice a day.  His proBNP in care everywhere and drawn recently was not elevated.          Dispo:  Return in about 3 months (around 04/10/2022).      Medication Adjustments/Labs and Tests Ordered: Current medicines are reviewed at length with the patient today.  Concerns regarding medicines are outlined above.   Tests Ordered: Orders Placed This Encounter  Procedures   Hepatic function panel   Lipid panel   Lipoprotein A (LPA)    Medication Changes: Meds ordered this encounter  Medications   furosemide (LASIX) 20 MG tablet    Sig: Take 1 tablet (20 mg total) by mouth 2 (two) times daily.    Dispense:  180 tablet    Refill:  3   rosuvastatin (CRESTOR) 20 MG tablet    Sig: Take 1 tablet (20 mg total) by mouth daily.    Dispense:  90 tablet    Refill:  3    Dose increase    History of Present Illness:    FOCUSED PROBLEM LIST:   1.  History of prior stroke with MRI March 2023 demonstrating chronic ischemic microvascular disease and multiple prior infarcts including bilateral pons, bilateral thalami, and cerebral white matter; on Plavix monotherapy per neurology 2.  Dyslipidemia 3.  Hypertension 4.  Severe COPD 5.  Obstructive sleep apnea 6.  Ongoing tobacco abuse 7.  BMI of 39.9 kg/m 8.  Aortic atherosclerosis on CT abdomen pelvis 2010  March 2023: The patient was seen for initial consultation regarding MRI findings showing chronic ischemic microvascular disease and multiple prior infarctions of the bilateral pons, bilateral thalami, and cerebral white matter.  Refer for bubble study echocardiogram which was negative for intracardiac shunt.  His ejection fraction was preserved at 60 to 65%.  His 30-day monitor demonstrated no atrial fibrillation and 1 episode of nonsustained ventricular tachycardia.  Artery Dopplers demonstrated very mild obstructive disease.  Today: The patient remains short of breath.  He is audibly wheezing today and his visit.  He did see pulmonology recently and was started on inhalers which have helped somewhat.  He has not been taking his daily Lasix and has noted increasing peripheral edema.  He  denies any exertional angina, lightheadedness, palpitations, paroxysmal nocturnal dyspnea, orthopnea.  He fortunately has not required emergency room visits or hospitalizations.  He has had no severe bleeding or bruising.  He is relatively unchanged versus last time I saw him other than the development of increasing peripheral edema.     Current Medications: Current Meds  Medication Sig   albuterol (VENTOLIN HFA) 108 (90 Base) MCG/ACT inhaler Inhale 1-2 puffs into the lungs as needed. Every 4-6 hours as needed.   amLODipine (NORVASC) 5 MG tablet Take 5 mg by mouth daily.   clopidogrel (PLAVIX) 75 MG tablet Take 1 tablet (75 mg total) by mouth daily.   furosemide (LASIX) 20 MG tablet Take 1 tablet (20 mg total) by mouth 2 (two) times daily.   levothyroxine (SYNTHROID, LEVOTHROID) 175 MCG tablet Take 175 mcg by mouth as directed. Take 1.5 tabs on Mondays & Fridays. Take 1 tab daily all other days.   lisinopril (ZESTRIL) 40 MG tablet Take 40 mg by mouth daily.   potassium chloride SA (KLOR-CON M) 20 MEQ tablet Take 20 mEq by mouth daily.   rosuvastatin (CRESTOR) 20 MG tablet Take 1 tablet (20 mg total) by mouth daily.   Tiotropium Bromide-Olodaterol (STIOLTO RESPIMAT) 2.5-2.5 MCG/ACT AERS Inhale 2 puffs into the lungs daily.   vitamin B-12 (CYANOCOBALAMIN) 1000 MCG tablet Take 1,000 mcg by mouth daily.   VITAMIN D PO Take 25 mcg by mouth daily.   [DISCONTINUED] aspirin 81 MG EC tablet Take 1 tablet by mouth daily.   [DISCONTINUED] furosemide (LASIX) 20 MG tablet Take 20 mg by mouth daily.   [DISCONTINUED] rosuvastatin (CRESTOR) 10 MG tablet Take 10 mg by mouth daily.     Allergies:    Patient has no known allergies.   Social History:   Social History   Tobacco Use   Smoking status: Every Day    Packs/day: 0.25    Years: 35.00    Pack years: 8.75    Types: Cigarettes   Smokeless tobacco: Never  Substance Use Topics   Alcohol use: Yes    Comment: occasional use   Drug use: No      Family Hx: Family History  Problem Relation Age of Onset   Thyroid disease Mother    Thyroid disease Father    Colon cancer Father 79   Cancer Father        colon   Thyroid disease Sister    Thyroid disease Sister    Heart disease Maternal Grandfather    Cancer Maternal Grandfather        lymphoma   Diabetes Maternal Aunt        x2   Diabetes Maternal Uncle    Diabetes Other        Mat great GM     Review of Systems:   Please see the history of present illness.    All other systems reviewed and are negative.     EKGs/Labs/Other Test Reviewed:    EKG: Sinus rhythm  Prior CV studies:  TTE 2023 with ejection fraction of 60 to 65% with grade 1 diastolic dysfunction no significant valvular abnormalities; separate echocardiogram demonstrated no intracardiac shunt with bubble study  Lower extremity Dopplers 2023 negative  Monitor 2023 No significant rhythm disturbances  Imaging studies that I have independently reviewed today: Mild atherosclerosis seen CT abdomen pelvis 2010  Recent Labs: 03/09/2021: ALT 22; BUN 12; Creatinine, Ser 1.09; Hemoglobin 16.9; Platelets 184; Potassium 4.1; Sodium 140   Recent Lipid Panel No results found for: CHOL, TRIG, HDL, LDLCALC, LDLDIRECT  Risk Assessment/Calculations:          Physical Exam:    VS:  BP 132/76   Pulse 82   Ht 5' 6.5" (1.689 m)   Wt 259 lb (117.5 kg)   SpO2 94%   BMI 41.18 kg/m    Wt Readings from Last 3 Encounters:  01/08/22 259 lb (117.5 kg)  10/27/21 245 lb (111.1 kg)  09/28/21 251 lb (113.9 kg)    GENERAL:  No apparent distress, Aox3, obese HEENT:  No carotid bruits, +2 carotid impulses, no scleral icterus CAR: RRR no murmurs, gallops, rubs, or thrills RES: Diffuse expiratory wheezes ABD:  Soft, nontender, nondistended, positive bowel sounds x 4 VASC:  +2 radial pulses, +2 carotid pulses, palpable pedal pulses NEURO:  CN 2-12 grossly intact; motor and sensory grossly intact PSYCH:  No active  depression or anxiety EXT: +2 swelling bilaterally Signed, Early Osmond, MD  01/08/2022 2:20 PM    [address], [place], [place]  [address] Phone: [phone]; Fax: [phone]   Note:  This document was prepared using Dragon voice recognition software and may include unintentional dictation errors.

## 2022-01-08 ENCOUNTER — Encounter: Payer: Self-pay | Admitting: Internal Medicine

## 2022-01-08 ENCOUNTER — Ambulatory Visit: Payer: Medicare HMO | Admitting: Internal Medicine

## 2022-01-08 VITALS — BP 132/76 | HR 82 | Ht 66.5 in | Wt 259.0 lb

## 2022-01-08 DIAGNOSIS — I10 Essential (primary) hypertension: Secondary | ICD-10-CM | POA: Diagnosis not present

## 2022-01-08 DIAGNOSIS — I639 Cerebral infarction, unspecified: Secondary | ICD-10-CM | POA: Diagnosis not present

## 2022-01-08 DIAGNOSIS — J449 Chronic obstructive pulmonary disease, unspecified: Secondary | ICD-10-CM

## 2022-01-08 DIAGNOSIS — E785 Hyperlipidemia, unspecified: Secondary | ICD-10-CM | POA: Diagnosis not present

## 2022-01-08 DIAGNOSIS — I700 Atherosclerosis of aorta: Secondary | ICD-10-CM

## 2022-01-08 DIAGNOSIS — Z6839 Body mass index (BMI) 39.0-39.9, adult: Secondary | ICD-10-CM

## 2022-01-08 DIAGNOSIS — R609 Edema, unspecified: Secondary | ICD-10-CM

## 2022-01-08 MED ORDER — ROSUVASTATIN CALCIUM 20 MG PO TABS: 20.0000 mg | ORAL_TABLET | Freq: Every day | ORAL | 3 refills | Status: DC

## 2022-01-08 MED ORDER — FUROSEMIDE 20 MG PO TABS: 20.0000 mg | ORAL_TABLET | Freq: Two times a day (BID) | ORAL | 3 refills | Status: DC

## 2022-01-08 NOTE — Patient Instructions (Signed)
Medication Instructions:  Your physician has recommended you make the following change in your medication:  1.) increase rosuvastatin (Crestor) to 20 mg -one tablet daily 2.) start furosemide (Lasix) 20 mg - take 1 tablet twice daily  *If you need a refill on your cardiac medications before your next appointment, please call your pharmacy*   Lab Work: Please return in 8 weeks for fasting blood work (lipids/liver/lp(a))   Testing/Procedures: none   Follow-Up: At [facility], you and your health needs are our priority.  As part of our continuing mission to provide you with exceptional heart care, we have created designated Provider Care Teams.  These Care Teams include your primary Cardiologist (physician) and Advanced Practice Providers (APPs -  Physician Assistants and Nurse Practitioners) who all work together to provide you with the care you need, when you need it.    Your next appointment:   3 month(s)  The format for your next appointment:   In Person  Provider:   Early Osmond, MD     Other Instructions   Important Information About Sugar

## 2022-01-13 ENCOUNTER — Telehealth: Payer: Self-pay | Admitting: Adult Health

## 2022-01-13 ENCOUNTER — Encounter: Payer: Self-pay | Admitting: Adult Health

## 2022-01-13 ENCOUNTER — Ambulatory Visit: Payer: Medicare HMO | Admitting: Adult Health

## 2022-01-13 VITALS — BP 148/94 | HR 85 | Ht 66.0 in | Wt 260.0 lb

## 2022-01-13 DIAGNOSIS — R202 Paresthesia of skin: Secondary | ICD-10-CM

## 2022-01-13 DIAGNOSIS — Z8673 Personal history of transient ischemic attack (TIA), and cerebral infarction without residual deficits: Secondary | ICD-10-CM | POA: Diagnosis not present

## 2022-01-13 DIAGNOSIS — Z720 Tobacco use: Secondary | ICD-10-CM

## 2022-01-13 DIAGNOSIS — R269 Unspecified abnormalities of gait and mobility: Secondary | ICD-10-CM

## 2022-01-13 NOTE — Patient Instructions (Signed)
You will be called to schedule imaging to look at your head vessels as well as evaluate for possible new stroke causing right sided symptoms  Please call neuro rehab Friday or Monday to schedule initial evaluation   Very important to quit smoking as continued use greatly increases your risk of additional strokes. Please further discuss options with your PCP if further assistance is needed  Continue clopidogrel 75 mg daily  and Crestor '20mg'$  daily  for secondary stroke prevention  Continue to follow up with PCP regarding cholesterol and blood pressure management  Maintain strict control of hypertension with blood pressure goal below 130/90 and cholesterol with LDL cholesterol (bad cholesterol) goal below 70 mg/dL.   Signs of a Stroke? Follow the BEFAST method:  Balance Watch for a sudden loss of balance, trouble with coordination or vertigo Eyes Is there a sudden loss of vision in one or both eyes? Or double vision?  Face: Ask the person to smile. Does one side of the face droop or is it numb?  Arms: Ask the person to raise both arms. Does one arm drift downward? Is there weakness or numbness of a leg? Speech: Ask the person to repeat a simple phrase. Does the speech sound slurred/strange? Is the person confused ? Time: If you observe any of these signs, call 911.     Followup in the future with me in 4 months or call earlier if needed       Thank you for coming to see [place] at [facility]. I hope we have been able to provide you high quality care today.  You may receive a patient satisfaction survey over the next few weeks. We would appreciate your feedback and comments so that we may continue to improve ourselves and the health of our patients.

## 2022-01-13 NOTE — Telephone Encounter (Signed)
Todd Elliott Auth: [id number] exp. 01/13/22-02/12/22 sent to GI they will call the patient to schedule

## 2022-01-13 NOTE — Progress Notes (Signed)
[facility] [address] [place] [address] [phone]       OFFICE FOLLOW UP NOTE  Mr. Todd Elliott Date of Birth:  1955-10-23 Medical Record Number:  [id number]   Reason for visit: Stroke follow-up    SUBJECTIVE:   CHIEF COMPLAINT:  Chief Complaint  Patient presents with   Follow-up    RM 3 with spouse Todd Elliott Pt is well, states the numbness is now on both sides. Still having swelling in feet    HPI:   Update 01/13/2022 JM: Patient returns for follow-up visit after prior initial visit with Dr. Krista Blue approximately 3 months ago.  He is accompanied by his wife.  He continues to have left-sided numbness but over the past 2 months, he had acute onset right-sided numbness on head, arm and leg.  Denies any pain associated with numbness, more just bothersome.  Previously having issues with left leg edema and now edema bilaterally. Cardiology recently increase Lasix dosage.  His balance remains poor which is baseline.  Denies any weakness on left or right side, visual changes, speech changes, worsening cognition or worsening balance. Reports compliance on Plavix and Crestor, denies side effects.  Cardiology recently increase Crestor dosage to 20 mg daily and plans on repeat labs in 2 months.  Blood pressure today 148/94.  Occasionally monitors at home and typically 130s/80s.  He continues to smoke approximately 1 pack/day.  Reports trying to quit in the past but was not successful.  Reports taking Chantix for 2 days but stopped as it was not helpful.  He continues to have difficulty with shortness of breath and is closely being followed by pulmonology.  He is also closely followed by cardiology and PCP.  He completed MRI back in March which did not show any acute findings but did show old strokes within the bilateral thalamus, bilateral dorsal pontine infarcts, periventricular and deep cerebral white matter infarcts and small foci of low SWI signal within left  llamas and bilateral cerebral hemispheres compatible with prior microhemorrhages.  He has since had full evaluation with cardiology with 2D echo with no evidence of PFO or LV thrombus and 30-day cardiac monitor negative.  Carotid ultrasound showed bilateral ICA 1 to 39% stenosis.  LE Dopplers negative for DVT.  Prior lab work in 08/2021 showed LDL 107 and in 09/2021 A1c 5.6.  No further concerns at this time    Consult visit 09/28/2021 Dr. Krista Blue (history provided for reference purposes only) Todd Elliott is a 66 year old male, seen in request by his primary care doctor Lazoff, Waldemar Dickens, for evaluation of left sided numbness, initial evaluation was on September 28, 2021.   I reviewed and summarized the referring note. PMHX HTN Hypothyroidism HLD Smoke 1ppd x 50 years. Obesity Snoring,    In 2022, he woke up 1 day noticed dense numbness from left side head, face, left upper extremity, left lower extremity, some subjective weakness, numbness has been persistent since its onset  He attempted MRI once in November 2022 without success due to severe claustrophobia, MRI under general anesthesia is pending in March 2023 ordered by his primary care physician  Prior to the event, he is already on aspirin 81 mg daily,   He also complains few months history of tender left lower lower extremity, Sweating   He also has mild snoring, obesity, COPD, was seen by pulmonologist, suggest sleep study, he declined at this point      ROS:   14 system review of systems  performed and negative with exception of those listed in HPI  PMH:  Past Medical History:  Diagnosis Date   Adrenal hyperplasia ([place])    Anal fistula    CHRONIC   COPD (chronic obstructive pulmonary disease) ([place])    Dyslipidemia    ED (erectile dysfunction)    Hemorrhoid    Hypercholesterolemia    Hypertension    Hypothyroidism    Left sided numbness     PSH:  Past Surgical History:  Procedure Laterality Date   ANAL  FISTULOTOMY N/A 01/30/2014   Procedure: ANAL FISTULOTOMY WITH DRAIN PLACEMENT;  Surgeon: [name], MD;  Location: [facility];  Service: General;  Laterality: N/A;   CHOLECYSTECTOMY  2013   COLONOSCOPY     EVALUATION UNDER ANESTHESIA WITH ANAL FISTULECTOMY N/A 06/26/2014   Procedure: ANAL EXAM UNDER ANESTHESIA ;  Surgeon: [name], MD;  Location: [facility];  Service: General;  Laterality: N/A;   EVALUATION UNDER ANESTHESIA WITH ANAL FISTULECTOMY N/A 04/17/2015   Procedure: EXAM UNDER ANESTHESIA WITH ANAL FISTULOTOMY;  Surgeon: [name], MD;  Location: [facility];  Service: General;  Laterality: N/A;   EXAMINATION UNDER ANESTHESIA N/A 01/30/2014   Procedure: EXAM UNDER ANESTHESIA;  Surgeon: [name], MD;  Location: [facility];  Service: General;  Laterality: N/A;   INCISION AND DRAINAGE PERIRECTAL ABSCESS Left 12/27/2013   Procedure: IRRIGATION AND DEBRIDEMENT PERIRECTAL ABSCESS;  Surgeon: Edward Jolly, MD;  Location: [facility];  Service: General;  Laterality: Left;   INCISION AND DRAINAGE PERIRECTAL ABSCESS N/A 02/10/2014   Procedure: IRRIGATION AND DEBRIDEMENT PERIRECTAL ABSCESS;  Surgeon: [name], MD;  Location: [facility];  Service: General;  Laterality: N/A;   INCISION AND DRAINAGE PERIRECTAL ABSCESS  06/26/2014   Procedure: IRRIGATION AND DEBRIDEMENT PERIRECTAL ABSCESS;  Surgeon: [name], MD;  Location: [facility];  Service: General;;   LAPAROSOPCY CONVERTED TO OPEN [place]  08-11-2005   ORIF LEFT SCAPHOID FX MID SHAFT  12-27-2003   ORIF RADIUS FX RIGHT MID SHAFT/ CLOSED REDUCTION RADIUS AND ULNAR DISLOCATION WRIST/  I & D AND CLOSURE  11-21-2003   VENTRAL HERNIA REPAIR  2013    Social History:  Social History   Socioeconomic History   Marital status: Married    Spouse name: Todd Elliott   Number of children: 3   Years of education: 12   Highest  education level: High school graduate  Occupational History   Occupation: Retired  Tobacco Use   Smoking status: Every Day    Packs/day: 0.25    Years: 35.00    Pack years: 8.75    Types: Cigarettes   Smokeless tobacco: Never  Substance and Sexual Activity   Alcohol use: Yes    Comment: occasional use   Drug use: No   Sexual activity: Not on file  Other Topics Concern   Not on file  Social History Narrative   Lives at home with his wife.   Right-handed.   Caffeine use: 4 cups per day.   Social Determinants of Health   Financial Resource Strain: Not on file  Food Insecurity: Not on file  Transportation Needs: Not on file  Physical Activity: Not on file  Stress: Not on file  Social Connections: Not on file  Intimate Partner Violence: Not on file    Family History:  Family History  Problem Relation Age of Onset   Thyroid disease Mother    Thyroid disease Father  Colon cancer Father 50   Cancer Father        colon   Thyroid disease Sister    Thyroid disease Sister    Heart disease Maternal Grandfather    Cancer Maternal Grandfather        lymphoma   Diabetes Maternal Aunt        x2   Diabetes Maternal Uncle    Diabetes Other        Mat great GM    Medications:   Current Outpatient Medications on File Prior to Visit  Medication Sig Dispense Refill   albuterol (VENTOLIN HFA) 108 (90 Base) MCG/ACT inhaler Inhale 1-2 puffs into the lungs as needed. Every 4-6 hours as needed.     amLODipine (NORVASC) 5 MG tablet Take 5 mg by mouth daily.     clopidogrel (PLAVIX) 75 MG tablet Take 1 tablet (75 mg total) by mouth daily. 30 tablet 11   furosemide (LASIX) 20 MG tablet Take 1 tablet (20 mg total) by mouth 2 (two) times daily. 180 tablet 3   levothyroxine (SYNTHROID, LEVOTHROID) 175 MCG tablet Take 175 mcg by mouth as directed. Take 1.5 tabs on Mondays & Fridays. Take 1 tab daily all other days.     lisinopril (ZESTRIL) 40 MG tablet Take 40 mg by mouth daily.      potassium chloride SA (KLOR-CON M) 20 MEQ tablet Take 20 mEq by mouth daily.     rosuvastatin (CRESTOR) 20 MG tablet Take 1 tablet (20 mg total) by mouth daily. 90 tablet 3   Tiotropium Bromide-Olodaterol (STIOLTO RESPIMAT) 2.5-2.5 MCG/ACT AERS Inhale 2 puffs into the lungs daily.     vitamin B-12 (CYANOCOBALAMIN) 1000 MCG tablet Take 1,000 mcg by mouth daily.     VITAMIN D PO Take 25 mcg by mouth daily.     No current facility-administered medications on file prior to visit.    Allergies:  No Known Allergies    OBJECTIVE:  Physical Exam  Vitals:   01/13/22 1428  BP: (!) 148/94  Pulse: 85  Weight: 260 lb (117.9 kg)  Height: '5\' 6"'$  (1.676 m)   Body mass index is 41.97 kg/m. No results found.  General: well developed, well nourished, very pleasant elderly Caucasian male, seated, in no evident distress although becomes short of breath easily Head: head normocephalic and atraumatic.   Neck: supple with no carotid or supraclavicular bruits Cardiovascular: regular rate and rhythm, no murmurs Musculoskeletal: no deformity Skin:  no rash/petichiae; R>L LE pitting edema  Vascular:  Normal pulses all extremities   Neurologic Exam Mental Status: Awake and fully alert.  Fluent speech and language.  Oriented to place and time. Recent memory impaired and remote memory intact. Attention span, concentration and fund of knowledge mostly appropriate during visit. Mood and affect appropriate.  Cranial Nerves: Pupils equal, briskly reactive to light. Extraocular movements full without nystagmus. Visual fields full to confrontation. Hearing intact. Facial sensation intact. Face, tongue, palate moves normally and symmetrically.  Motor: Normal bulk and tone. Normal strength in all tested extremity muscles Sensory.: intact to touch , pinprick , position and vibratory sensation although subjective numbness complete right and left side Coordination: Rapid alternating movements normal in all  extremities. Finger-to-nose and heel-to-shin performed accurately bilaterally. Gait and Station: Arises from chair with mild difficulty. Stance is slightly hunched. Gait demonstrates decreased stride length and step height bilaterally with imbalance/unsteadiness and use of cane. Tandem walk and heel toe not attempted Reflexes: 1+ and symmetric. Toes downgoing.  PERTINENT IMAGING  MR BRAIN W WO CONTRAST 10/15/2021 (View impression in care everywhere) IMPRESSION: 1.  Moderately limited exam secondary to patient motion.  2.  No acute intracranial abnormality.  3.  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   2D ECHO 10/13/2021 IMPRESSIONS   1. Left ventricular ejection fraction, by estimation, is 60 to 65%. Left  ventricular ejection fraction by 3D volume is 58 %. The left ventricle has  normal function. The left ventricle has no regional wall motion  abnormalities. Left ventricular diastolic   parameters are consistent with Grade I diastolic dysfunction (impaired  relaxation).   2. Right ventricular systolic function is normal. The right ventricular  size is normal.   3. The mitral valve is normal in structure. No evidence of mitral valve  regurgitation. No evidence of mitral stenosis.   4. The aortic valve is normal in structure. Aortic valve regurgitation is  not visualized. No aortic stenosis is present.   5. The inferior vena cava is normal in size with greater than 50%  respiratory variability, suggesting right atrial pressure of 3 mmHg.   2D ECHO LIMITED BUBBLE STUDY 11/06/2021 IMPRESSIONS   1. Left ventricular ejection fraction, by estimation, is 60 to 65%. The  left ventricle has normal function. Left ventricular diastolic parameters  are indeterminate.   2. Right ventricular systolic function is normal.   3. No evidence of mitral  valve regurgitation.   4. There is mild calcification of the aortic valve. Aortic valve  regurgitation is not visualized.   5. Agitated saline contrast bubble study was negative, with no evidence  of any interatrial shunt.   CAROTID DUPLEX 11/13/2021 Summary:  Right Carotid: Velocities in the right ICA are consistent with a 1-39% stenosis.  Left Carotid: Velocities in the left ICA are consistent with a 1-39% stenosis.  Vertebrals:  Bilateral vertebral arteries demonstrate antegrade flow.  Subclavians: Normal flow hemodynamics were seen in bilateral subclavian arteries.   LOWER EXTREMITY VENOUS 10/13/2021 Summary:  RIGHT:  - No evidence of common femoral vein obstruction.  LEFT:  - There is no evidence of deep vein thrombosis in the lower extremity.  - No cystic structure found in the popliteal fossa.          ASSESSMENT  ANDRU GENTER is a 66 y.o. year old male with sudden onset left-sided hemiparesthesias around 08/2020.  MRI completed 10/2021 (required general anesthesia for completion hence the delay of completing since onset) which showed multiple areas of old strokes including bilateral thalami, bilateral dorsal pontine infarcts, white matter infarcts and prior microhemorrhages in the left thalamus and bilateral cerebral hemispheres.  Vascular risk factors include HTN, HLD, tobacco use, hypothyroidism, obesity, B12 deficiency and suspected sleep apnea.  Now reports 65-monthonset of right hemiparesthesias     PLAN:  Multiple chronic strokes:  Likely etiology behind left-sided hemiparesthesias with evidence of bilateral thalamic infarcts.  Concern of recurrent left thalamic stroke with 224-monthnset of right hemiparesthesias Complete CTA head to complete stroke work-up and to evaluate for possible recurrent left thalamic stroke Will hold off on repeat MRI as this requires general anesthesia and current treatment plan would not change Discussed importance of proceeding to  ED immediately with any new or worsening stroke/TIA symptoms for emergent evaluation Referral placed to PT/OT for bilateral paresthesias and gait impairment Continue Plavix 75 mg daily and Crestor 20 mg daily for secondary  stroke prevention measures Follow-up with cardiology in 2 months for repeat lipid panel and if LDL remains above 70, would recommend considering increasing Crestor further to 40 mg daily.  Ensure continue to follow-up with cardiology/PCP for HTN management with BP goal<130/90 At risk for sleep apnea: Discussed possible underlying sleep apnea.  He previously declined further evaluation with pulmonology.  Discussed the risks and ramifications of untreated OSA especially with multiple prior strokes seen on imaging and longstanding HTN.  He continues to decline interest but will further discuss with pulmonology if he wishes to proceed with the future Tobacco use: Discussed ongoing use of tobacco greatly increases risk of additional strokes.  Discussed cessation measures and appropriate use of Chantix.  He will consider retrying and knows to follow-up with PCP if further assistance is needed.     Follow up in 4 months or call earlier if needed   CC:  [facility] provider: Dr. Krista Blue PCP: Rayann Heman, Shawn P, DO    I spent 38 minutes of face-to-face and non-face-to-face time with patient and wife.  This included previsit chart review, lab review, study review, order entry, electronic health record documentation, patient and wife education and discussion regarding bilateral paresthesias and possible etiology, further intervention and typical recovery time, prolonged discussion regarding stroke risk factors and secondary stroke prevention measures, review and discussion of MRI and answered all other questions to patient and wife satisfaction  Frann Rider, AGNP-BC  [facility] [address] [place], [place] [address]  Phone [phone] Fax [phone] Note:  This document was prepared with digital dictation and possible smart phrase technology. Any transcriptional errors that result from this process are unintentional.

## 2022-01-13 NOTE — Telephone Encounter (Signed)
Referral for PT/OT sent to [facility] [phone].

## 2022-01-26 ENCOUNTER — Telehealth: Payer: Self-pay | Admitting: Neurology

## 2022-01-26 DIAGNOSIS — Z8673 Personal history of transient ischemic attack (TIA), and cerebral infarction without residual deficits: Secondary | ICD-10-CM

## 2022-01-26 NOTE — Telephone Encounter (Signed)
I personally reviewed MRI of the brain from [facility] on October 15, 2021, mailed in disc from patient,  Motion degraded imaging, no acute intracranial abnormality, extensive chronic ischemic microvascular disease multiple lacunar infarctions including bilateral thalamus, bilateral dorsal pons, and periventricular white matter  CT chest showed emphysema,  He has vascular risk factors of hypertension, hyperlipidemia, smoker, obesity, obstructive sleep apnea, COPD,  Was seen by Janett Billow Jan 13, 2022 complaints of new episode of right-sided numbness,  Carotid Doppler study showed less than 39% stenosis bilaterally blood pressure was in 140-150/75-95 range  The location of the stroke, could indicating multiple small vessel lacunar infarction versus embolic event due to bilateral thalamus involvement  He is on Plavix, history suggest that he has recurrent spells likely in the PCA territory (left thalamus)  I will refer him for loop recorder

## 2022-01-28 ENCOUNTER — Telehealth: Payer: Self-pay | Admitting: Neurology

## 2022-01-28 NOTE — Telephone Encounter (Signed)
Referral for Cardiology sent to [facility] at [facility].

## 2022-02-03 ENCOUNTER — Encounter: Payer: Self-pay | Admitting: Occupational Therapy

## 2022-02-03 ENCOUNTER — Ambulatory Visit: Payer: Medicare HMO | Admitting: Occupational Therapy

## 2022-02-03 ENCOUNTER — Ambulatory Visit: Payer: Medicare HMO | Attending: Adult Health | Admitting: Physical Therapy

## 2022-02-03 ENCOUNTER — Encounter: Payer: Self-pay | Admitting: Physical Therapy

## 2022-02-03 VITALS — BP 127/87 | HR 81

## 2022-02-03 DIAGNOSIS — Z8673 Personal history of transient ischemic attack (TIA), and cerebral infarction without residual deficits: Secondary | ICD-10-CM | POA: Insufficient documentation

## 2022-02-03 DIAGNOSIS — R2681 Unsteadiness on feet: Secondary | ICD-10-CM | POA: Diagnosis present

## 2022-02-03 DIAGNOSIS — R278 Other lack of coordination: Secondary | ICD-10-CM

## 2022-02-03 DIAGNOSIS — R208 Other disturbances of skin sensation: Secondary | ICD-10-CM

## 2022-02-03 DIAGNOSIS — M6281 Muscle weakness (generalized): Secondary | ICD-10-CM

## 2022-02-03 DIAGNOSIS — R202 Paresthesia of skin: Secondary | ICD-10-CM | POA: Diagnosis present

## 2022-02-03 DIAGNOSIS — R2689 Other abnormalities of gait and mobility: Secondary | ICD-10-CM | POA: Diagnosis present

## 2022-02-03 NOTE — Therapy (Signed)
OUTPATIENT PHYSICAL THERAPY NEURO EVALUATION   Patient Name: Todd Elliott MRN: [id number] DOB:Nov 05, 1955, 66 y.o., male Today's Date: 02/03/2022   PCP: [name], DO REFERRING PROVIDER: Frann Rider, NP    PT End of Session - 02/03/22 1500     Visit Number 1    Number of Visits 13   12+eval   Date for PT Re-Evaluation 05/14/22   Pushed out due to scheduling.   Authorization Type [employer] MEDICARE    PT Start Time 7494    PT Stop Time 1537    PT Time Calculation (min) 48 min    Equipment Utilized During Treatment Gait belt    Activity Tolerance Patient tolerated treatment well    Behavior During Therapy WFL for tasks assessed/performed             Past Medical History:  Diagnosis Date   Adrenal hyperplasia ([place])    Anal fistula    CHRONIC   COPD (chronic obstructive pulmonary disease) ([place])    Dyslipidemia    ED (erectile dysfunction)    Hemorrhoid    Hypercholesterolemia    Hypertension    Hypothyroidism    Left sided numbness    Past Surgical History:  Procedure Laterality Date   ANAL FISTULOTOMY N/A 01/30/2014   Procedure: ANAL FISTULOTOMY WITH DRAIN PLACEMENT;  Surgeon: [name], MD;  Location: [place];  Service: General;  Laterality: N/A;   CHOLECYSTECTOMY  2013   COLONOSCOPY     EVALUATION UNDER ANESTHESIA WITH ANAL FISTULECTOMY N/A 06/26/2014   Procedure: ANAL EXAM UNDER ANESTHESIA ;  Surgeon: [name], MD;  Location: [place];  Service: General;  Laterality: N/A;   EVALUATION UNDER ANESTHESIA WITH ANAL FISTULECTOMY N/A 04/17/2015   Procedure: EXAM UNDER ANESTHESIA WITH ANAL FISTULOTOMY;  Surgeon: [name], MD;  Location: [place];  Service: General;  Laterality: N/A;   EXAMINATION UNDER ANESTHESIA N/A 01/30/2014   Procedure: EXAM UNDER ANESTHESIA;  Surgeon: [name], MD;  Location: [place];  Service: General;  Laterality: N/A;   INCISION AND DRAINAGE  PERIRECTAL ABSCESS Left 12/27/2013   Procedure: IRRIGATION AND DEBRIDEMENT PERIRECTAL ABSCESS;  Surgeon: Edward Jolly, MD;  Location: [facility];  Service: General;  Laterality: Left;   INCISION AND DRAINAGE PERIRECTAL ABSCESS N/A 02/10/2014   Procedure: IRRIGATION AND DEBRIDEMENT PERIRECTAL ABSCESS;  Surgeon: [name], MD;  Location: [place];  Service: General;  Laterality: N/A;   INCISION AND DRAINAGE PERIRECTAL ABSCESS  06/26/2014   Procedure: IRRIGATION AND DEBRIDEMENT PERIRECTAL ABSCESS;  Surgeon: [name], MD;  Location: [employer];  Service: General;;   LAPAROSOPCY CONVERTED TO OPEN ILEOCECOSTOMY FOR GANGRENOUS RUPTURED APPENDIX  08-11-2005   ORIF LEFT SCAPHOID FX MID SHAFT  12-27-2003   ORIF RADIUS FX RIGHT MID SHAFT/ CLOSED REDUCTION RADIUS AND ULNAR DISLOCATION WRIST/  I & D AND CLOSURE  11-21-2003   VENTRAL HERNIA REPAIR  2013   Patient Active Problem List   Diagnosis Date Noted   Gait abnormality 09/28/2021   Paresthesia 09/28/2021   Left leg swelling 09/28/2021   Stage 3 severe COPD by GOLD classification ([place]) 05/29/2021   Anal abscess 02/10/2014   Perirectal abscess 12/27/2013   Rectal pain 12/26/2013   Personal history of colonic polyps 12/26/2013   ABNORMAL FINDINGS GI TRACT 03/03/2009   ADRENAL MASS, BILATERAL 02/21/2009   FECAL INCONTINENCE 01/09/2009   CHANGE IN BOWELS 01/09/2009   ABDOMINAL PAIN-RLQ 01/09/2009    ONSET DATE: 01/13/2022  REFERRING DIAG: R20.2 (ICD-10-CM) - Paresthesia of right upper and lower extremity Z86.73 (ICD-10-CM) - History of multiple strokes   THERAPY DIAG:  Paresthesia of skin  Other abnormalities of gait and mobility  Muscle weakness (generalized)  Unsteadiness on feet  Rationale for Evaluation and Treatment Rehabilitation  SUBJECTIVE:                                                                                                                                                                                               SUBJECTIVE STATEMENT: He is unsure when he had his first stroke, just woke up and was numb on the left side and could not see out of his left eye.  He remembers nothing about his second stroke.  He went to bed and woke up numb on the right side.  His vision has returned back to baseline.  His N/T persists from middle of his head all the way to his toes bilaterally.  He also states his balance is off.  He feels he has drastically slowed down and he always feel off balance when he first stands up.  Wife states he steps backwards sometimes when he first gets up. Pt accompanied by: significant other - [name] (wife)  PERTINENT HISTORY: multiple CVA affecting bilaterally-most recent occurred 10/2021, adrenal hyperplasia, COPD stage 3, hypercholesterolemia, HTN  PAIN:  Are you having pain?  He states the N/T is aggravating, but not painful.  PRECAUTIONS: Fall  WEIGHT BEARING RESTRICTIONS No  FALLS: Has patient fallen in last 6 months? No - his wife reminds him of incident in the shower where he went onto his hands and knees because he thought he was going to fall.  He pulled himself to his feet using grab bars.  His wife states he is not allowed to shower alone and mostly is supervised in the shower.  LIVING ENVIRONMENT: Lives with: lives with their spouse Lives in: House/apartment Stairs: Yes: External: 5 steps; on right going up, on left going up, and can reach both Has following equipment at home: Single point cane, Wheelchair (manual), Grab bars, and considering purchasing a shower chair  PLOF: Requires assistive device for independence and he states he needs extra time to get his balance.  PATIENT GOALS "To be well"  OBJECTIVE:   DIAGNOSTIC FINDINGS: CT ordered for 02/08/2022-wife states it is to see if another stroke has occurred.  COGNITION: Overall cognitive status: Impaired and Pt states his memory is worse, wife has to provide information   SENSATION: Light  touch: WFL Pt states it evenly feels like "you're scratching a blank piece of paper."  COORDINATION:  LE RAMPS:  Impaired, R>L Heel-to-shin:  WFL (limited ROM)  EDEMA:  Bilateraly 2+ pitting edema extending from knee joint to toes bilaterally, this is new since both strokes.  MUSCLE TONE: None noted bilaterally  POSTURE: No Significant postural limitations  LOWER EXTREMITY ROM:     Active  Right Eval Left Eval  Hip flexion Functionally assessed WFL Functionally assessed WFL  Hip extension    Hip abduction    Hip adduction    Hip internal rotation    Hip external rotation    Knee flexion    Knee extension    Ankle dorsiflexion    Ankle plantarflexion    Ankle inversion    Ankle eversion     (Blank rows = not tested)  LOWER EXTREMITY MMT:    MMT Right Eval Left Eval  Hip flexion Functionally assessed WFL Functionally assessed WFL  Hip extension    Hip abduction    Hip adduction    Hip internal rotation    Hip external rotation    Knee flexion    Knee extension    Ankle dorsiflexion    Ankle plantarflexion    Ankle inversion    Ankle eversion    (Blank rows = not tested)  BED MOBILITY:  Sit to supine Complete Independence Supine to sit Complete Independence Pt sleeps on couch to elevate head due to orthopnea.  TRANSFERS: Assistive device utilized: None  Sit to stand: SBA Stand to sit: SBA SBA due to fluctuation of immediate standing balance.  STAIRS:  Level of Assistance: SBA and CGA  Stair Negotiation Technique: Alternating Pattern  Forwards with Single Rail on Left  Number of Stairs: 4   Height of Stairs: 6"  Comments: Good sequencing w/ use of cane on right and left rail ascending.  Demonstrates mild hesitancy on descent.  GAIT: Gait pattern: step through pattern, decreased arm swing- Left, decreased stride length, trunk flexed, and wide BOS Distance walked: 21' between assessments Assistive device utilized: Single point cane (on right  side) Level of assistance: SBA Comments: Pt ambulates w/ decreased speed and   FUNCTIONAL TESTs:  5 times sit to stand: 20.07 sec w/ BUE support 10 meter walk test: 15.25 sec = 0.66 m/sec OR 2.16 ft/sec  PATIENT SURVEYS:  Chronic stroke-FOTO N/A  TODAY'S TREATMENT:  N/A   PATIENT EDUCATION: Education details: PT POC, assessments used, and goals set. Person educated: Patient and Spouse Education method: Explanation Education comprehension: verbalized understanding and needs further education   HOME EXERCISE PROGRAM: To be established at follow-up.  GOALS: Goals reviewed with patient? Yes  SHORT TERM GOALS: Target date: 03/05/2022  Pt will be independent in performance of initial strength and balance HEP with supervision from family. Baseline:  To be established. Goal status: INITIAL  2.  Pt will decrease 5xSTS to </= 16 seconds in order to demonstrate decreased risk for falls and improved functional bilateral LE strength and power. Baseline: 20.07 seconds w/ BUE support Goal status: INITIAL  3.  Pt will demonstrate a gait speed of >/=2.5 feet/sec w/ LRAD in order to decrease risk for falls. Baseline: 2.16 ft/sec w/ SPC Goal status: INITIAL  4.  6MWT to be assessed with STG updated as appropriate. Baseline: Not assessed on eval. Goal status: INITIAL  5.  FGA to be assessed with STG updated as appropriate. Baseline: Not assessed on eval. Goal status: INITIAL  LONG TERM GOALS: Target date: 04/02/2022 (8 weeks) 04/30/2022 (12 weeks)  Pt will be independent with finalized strength  and balance HEP with supervision from family. Baseline: To be established. Goal status: INITIAL  2.  Pt will decrease 5xSTS to </=13 seconds in order to demonstrate decreased risk for falls and improved functional bilateral LE strength and power. Baseline: 20.07 seconds w/ BUE support Goal status: INITIAL  3.  Pt will demonstrate a gait speed of >/=3.0 feet/sec w/ LRAD in order to  decrease risk for falls. Baseline: 2.16 ft/sec w/ SPC Goal status: INITIAL  4.  6MWT to be assessed with LTG updated as appropriate. Baseline: Not assessed on eval. Goal status: INITIAL  5.  FGA to be assessed with LTG updated as appropriate. Baseline: Not assessed on eval. Goal status: INITIAL  ASSESSMENT:  CLINICAL IMPRESSION: Patient is a 66 y.o. male who was seen today for physical therapy evaluation and treatment for imbalance following multiple incidents of CVA affecting bilateral hemibody.  Pt has a significant PMH of multiple CVA affecting bilaterally-most recent occurred 10/2021, adrenal hyperplasia, COPD stage 3, hypercholesterolemia, HTN.  Identified impairments include generalized weakness bilaterally, imbalance, decreased gait speed, dyspnea on exertion, diminished activity tolerance, and mildly impaired coordination right worse than left.  Evaluation via the following assessment tools: 5xSTS and 10MWT indicate fall risk.  They would benefit from skilled PT to address impairments as noted and progress towards long term goals.  OBJECTIVE IMPAIRMENTS Abnormal gait, decreased activity tolerance, decreased balance, decreased coordination, decreased endurance, decreased knowledge of condition, decreased knowledge of use of DME, decreased mobility, difficulty walking, decreased ROM, decreased strength, increased edema, and impaired sensation.   ACTIVITY LIMITATIONS carrying, standing, bathing, and locomotion level  PARTICIPATION LIMITATIONS: community activity  PERSONAL FACTORS Age, Education, Fitness, Past/current experiences, Time since onset of injury/illness/exacerbation, and 3+ comorbidities: multiple CVA affecting bilaterally, COPD stage 3, and HTN  are also affecting patient's functional outcome.   REHAB POTENTIAL: Fair See personal factors and PMH  CLINICAL DECISION MAKING: Evolving/moderate complexity  EVALUATION COMPLEXITY: Moderate  PLAN: PT FREQUENCY: 1x/week  PT  DURATION: 12 weeks  PLANNED INTERVENTIONS: Therapeutic exercises, Therapeutic activity, Neuromuscular re-education, Balance training, Gait training, Patient/Family education, Joint mobilization, Stair training, Vestibular training, DME instructions, Manual therapy, and Re-evaluation  PLAN FOR NEXT SESSION: 6MWT, FGA, update STG/LTG, initial HEP for LE strength and balance, stair training, gait training w/ and w/o AD, SciFit for endurance   Bary Richard, PT, DPT 02/03/2022, 4:29 PM

## 2022-02-03 NOTE — Therapy (Signed)
OUTPATIENT OCCUPATIONAL THERAPY NEURO EVALUATION  Patient Name: Todd Elliott MRN: [id number] DOB:04/26/1956, 66 y.o., male Today's Date: 02/03/2022  PCP: Irene Pap REFERRING PROVIDER: Frann Rider   OT End of Session - 02/03/22 1722     Visit Number 1    Number of Visits 5    Date for OT Re-Evaluation 03/15/22    Authorization Type [employer]    OT Start Time 1400    OT Stop Time 4259    OT Time Calculation (min) 45 min    Activity Tolerance Patient tolerated treatment well    Behavior During Therapy WFL for tasks assessed/performed             Past Medical History:  Diagnosis Date   Adrenal hyperplasia ([place])    Anal fistula    CHRONIC   COPD (chronic obstructive pulmonary disease) ([place])    Dyslipidemia    ED (erectile dysfunction)    Hemorrhoid    Hypercholesterolemia    Hypertension    Hypothyroidism    Left sided numbness    Past Surgical History:  Procedure Laterality Date   ANAL FISTULOTOMY N/A 01/30/2014   Procedure: ANAL FISTULOTOMY WITH DRAIN PLACEMENT;  Surgeon: [name], MD;  Location: [place];  Service: General;  Laterality: N/A;   CHOLECYSTECTOMY  2013   COLONOSCOPY     EVALUATION UNDER ANESTHESIA WITH ANAL FISTULECTOMY N/A 06/26/2014   Procedure: ANAL EXAM UNDER ANESTHESIA ;  Surgeon: [name], MD;  Location: [place];  Service: General;  Laterality: N/A;   EVALUATION UNDER ANESTHESIA WITH ANAL FISTULECTOMY N/A 04/17/2015   Procedure: EXAM UNDER ANESTHESIA WITH ANAL FISTULOTOMY;  Surgeon: [name], MD;  Location: [place];  Service: General;  Laterality: N/A;   EXAMINATION UNDER ANESTHESIA N/A 01/30/2014   Procedure: EXAM UNDER ANESTHESIA;  Surgeon: [name], MD;  Location: [place];  Service: General;  Laterality: N/A;   INCISION AND DRAINAGE PERIRECTAL ABSCESS Left 12/27/2013   Procedure: IRRIGATION AND DEBRIDEMENT PERIRECTAL ABSCESS;  Surgeon:  Edward Jolly, MD;  Location: [facility];  Service: General;  Laterality: Left;   INCISION AND DRAINAGE PERIRECTAL ABSCESS N/A 02/10/2014   Procedure: IRRIGATION AND DEBRIDEMENT PERIRECTAL ABSCESS;  Surgeon: [name], MD;  Location: [place];  Service: General;  Laterality: N/A;   INCISION AND DRAINAGE PERIRECTAL ABSCESS  06/26/2014   Procedure: IRRIGATION AND DEBRIDEMENT PERIRECTAL ABSCESS;  Surgeon: [name], MD;  Location: [place];  Service: General;;   LAPAROSOPCY CONVERTED TO OPEN ILEOCECOSTOMY FOR GANGRENOUS RUPTURED APPENDIX  08-11-2005   ORIF LEFT SCAPHOID FX MID SHAFT  12-27-2003   ORIF RADIUS FX RIGHT MID SHAFT/ CLOSED REDUCTION RADIUS AND ULNAR DISLOCATION WRIST/  I & D AND CLOSURE  11-21-2003   VENTRAL HERNIA REPAIR  2013   Patient Active Problem List   Diagnosis Date Noted   Gait abnormality 09/28/2021   Paresthesia 09/28/2021   Left leg swelling 09/28/2021   Stage 3 severe COPD by GOLD classification ([place]) 05/29/2021   Anal abscess 02/10/2014   Perirectal abscess 12/27/2013   Rectal pain 12/26/2013   Personal history of colonic polyps 12/26/2013   ABNORMAL FINDINGS GI TRACT 03/03/2009   ADRENAL MASS, BILATERAL 02/21/2009   FECAL INCONTINENCE 01/09/2009   CHANGE IN BOWELS 01/09/2009   ABDOMINAL PAIN-RLQ 01/09/2009    ONSET DATE: 02/2021 - Left sided numbness, 10/15/2021 - diagnosed with multiple strokes  REFERRING DIAG: R20.2ICD-10-CMParesthesia of right upper and lower extremity  Z86.73ICD-10-CMHistory of  multiple strokes  THERAPY DIAG:  Other disturbances of skin sensation - Plan: Ot plan of care cert/re-cert  Other lack of coordination - Plan: Ot plan of care cert/re-cert  Unsteadiness on feet - Plan: Ot plan of care cert/re-cert  Muscle weakness (generalized) - Plan: Ot plan of care cert/re-cert  Rationale for Evaluation and Treatment Rehabilitation  SUBJECTIVE:   SUBJECTIVE STATEMENT: "Fix me without a hammer"  Pt  accompanied by: significant other  PERTINENT HISTORY: HTN, COPD, Multiple strokes - chronic  PRECAUTIONS: Fall  WEIGHT BEARING RESTRICTIONS No  PAIN:  Are you having pain? No  FALLS: Has patient fallen in last 6 months? No  LIVING ENVIRONMENT: Lives with: lives with their spouse Lives in: House/apartment Stairs: Yes: External: 5 steps; can reach both Has following equipment at home:  walking stick  PLOF: Independent with basic ADLs  PATIENT GOALS Reduce numbness  OBJECTIVE:   HAND DOMINANCE: Right (and left)  ADLs: Overall ADLs: increased time occasional assistance Transfers/ambulation related to ADLs:walks outside with walking stick.   Eating: independent Grooming: independent UB Dressing: Independent - takes increased time LB Dressing: mod I - Needs assist with compression stockings.   Toileting: independent Bathing: independent per patient / supervision per wife Tub Shower transfers: step over Equipment: Grab bars  Handwriting: 100% legible    POSTURE COMMENTS:  rounded shoulders and posterior pelvic tilt   ACTIVITY TOLERANCE: Activity tolerance: limited - chronic COPD    UPPER EXTREMITY ROM     Active ROM Right eval Left eval  Shoulder flexion [facility]  Shoulder abduction THRUOUT THRUOUT  Shoulder adduction    Shoulder extension    Shoulder internal rotation    Shoulder external rotation    Elbow flexion    Elbow extension    Wrist flexion    Wrist extension    Wrist ulnar deviation    Wrist radial deviation    Wrist pronation    Wrist supination    (Blank rows = not tested)   UPPER EXTREMITY MMT:     MMT Right eval Left eval  Shoulder flexion 4+/5 4+/5  Shoulder abduction THRUOUT THRUOUT  Shoulder adduction    Shoulder extension    Shoulder internal rotation    Shoulder external rotation    Middle trapezius    Lower trapezius    Elbow flexion    Elbow extension    Wrist flexion    Wrist extension    Wrist ulnar deviation     Wrist radial deviation    Wrist pronation    Wrist supination    (Blank rows = not tested)  HAND FUNCTION: Grip strength: Right: 77.8 lbs; Left: 85.7 lbs and Lateral pinch: Right: 30 lbs, Left: 31 lbs  COORDINATION: Finger Nose Finger test: Mild undershooting BUE 9 Hole Peg test: Right: 34.22 sec; Left: 31.28 sec  SENSATION: Light touch: WFL Stereognosis: WFL Hot/Cold: WFL Proprioception: WFL  EDEMA: Significant swelling in BLE  MUSCLE TONE: RUE: Within functional limits and LUE: Within functional limits  COGNITION: Overall cognitive status: Impaired reports decreased memory.  Will continue to assess in functional conditions   PERCEPTION: WFL  PRAXIS: WFL     TODAY'S TREATMENT:  Reviewed results of OT evaluation and potential goals.  Reviewed potential tub/shower equipment to increase safety.     PATIENT EDUCATION: Education details: eval results Person educated: Patient and Spouse Education method: Explanation Education comprehension: verbalized understanding and needs further education   HOME EXERCISE PROGRAM: To be determined    GOALS: Goals  reviewed with patient? Yes    LONG TERM GOALS: Target date: 03/15/22  Patient will complete HEP designed to address BUE coordination and strength Baseline:  Goal status: INITIAL  2.  Patient and wife will demonstrate understanding regarding tub/shower equipment/ recommendations Baseline:  Goal status: INITIAL  3.  Patient will complete modified simple yard work x 5-10 minutes Baseline:  Goal status: INITIAL  4.  Patient will demonstrate 2 sec reduction in time for right 9 hole peg test Baseline:  Goal status: INITIAL  5.  Patient will report reduced overall time to dress himself daily Baseline:  Goal status: INITIAL  ASSESSMENT:  CLINICAL IMPRESSION: Patient is a 66 yr old male who was referred for newer onset right sided paresthesias.  He had an MRI 10/15/21 without acute findings but chronic  strokes within the bilateral thalamus, bilateral dorsal pontine infarcts, periventricular and deep cerebral white matter infarcts and small foci of low SWI signal within left llamas and bilateral cerebral hemispheres compatible with prior microhemorrhages.     PERFORMANCE DEFICITS in functional skills including coordination, dexterity, sensation, edema, pain, flexibility, GMC, balance, endurance, decreased knowledge of precautions, decreased knowledge of use of DME, and UE functional use, cognitive skills including memory, and psychosocial skills including habits.   IMPAIRMENTS are limiting patient from ADLs and IADLs.   COMORBIDITIES may have co-morbidities  that affects occupational performance. Patient will benefit from skilled OT to address above impairments and improve overall function.  MODIFICATION OR ASSISTANCE TO COMPLETE EVALUATION: Min-Moderate modification of tasks or assist with assess necessary to complete an evaluation.  OT OCCUPATIONAL PROFILE AND HISTORY: Detailed assessment: Review of records and additional review of physical, cognitive, psychosocial history related to current functional performance.  CLINICAL DECISION MAKING: Moderate - several treatment options, min-mod task modification necessary  REHAB POTENTIAL: Good  EVALUATION COMPLEXITY: Moderate    PLAN: OT FREQUENCY: 1x/week  OT DURATION: 4 weeks  PLANNED INTERVENTIONS: self care/ADL training, therapeutic exercise, therapeutic activity, neuromuscular re-education, balance training, functional mobility training, aquatic therapy, ultrasound, patient/family education, and DME and/or AE instructions  RECOMMENDED OTHER SERVICES: NA  CONSULTED AND AGREED WITH PLAN OF CARE: Patient and family member/caregiver  PLAN FOR NEXT SESSION: Assess safety with tub/shower transfer and discuss equipment   Mariah Milling, OT 02/03/2022, 5:40 PM

## 2022-02-08 ENCOUNTER — Telehealth: Payer: Self-pay | Admitting: *Deleted

## 2022-02-08 ENCOUNTER — Ambulatory Visit
Admission: RE | Admit: 2022-02-08 | Discharge: 2022-02-08 | Disposition: A | Discharge status: Home / Self Care | Payer: Medicare HMO | Source: Ambulatory Visit | Attending: Adult Health | Admitting: Adult Health

## 2022-02-08 MED ORDER — IOPAMIDOL (ISOVUE-370) INJECTION 76%
75.0000 mL | Freq: Once | INTRAVENOUS | Status: AC | PRN
  Administered 2022-02-08: 75 mL via INTRAVENOUS

## 2022-02-08 NOTE — Telephone Encounter (Signed)
LVM requesting call back for scan results.

## 2022-02-12 ENCOUNTER — Encounter: Payer: Self-pay | Admitting: Occupational Therapy

## 2022-02-12 ENCOUNTER — Ambulatory Visit: Payer: Medicare HMO | Admitting: Occupational Therapy

## 2022-02-12 ENCOUNTER — Ambulatory Visit: Payer: Medicare HMO | Admitting: Physical Therapy

## 2022-02-12 ENCOUNTER — Encounter: Payer: Self-pay | Admitting: Physical Therapy

## 2022-02-12 VITALS — BP 148/85 | HR 64

## 2022-02-12 DIAGNOSIS — R278 Other lack of coordination: Secondary | ICD-10-CM

## 2022-02-12 DIAGNOSIS — R202 Paresthesia of skin: Secondary | ICD-10-CM | POA: Diagnosis not present

## 2022-02-12 DIAGNOSIS — M6281 Muscle weakness (generalized): Secondary | ICD-10-CM

## 2022-02-12 DIAGNOSIS — R2689 Other abnormalities of gait and mobility: Secondary | ICD-10-CM

## 2022-02-12 DIAGNOSIS — R208 Other disturbances of skin sensation: Secondary | ICD-10-CM

## 2022-02-12 DIAGNOSIS — R2681 Unsteadiness on feet: Secondary | ICD-10-CM

## 2022-02-12 NOTE — Therapy (Signed)
OUTPATIENT PHYSICAL THERAPY NEURO EVALUATION   Patient Name: Todd Elliott MRN: [id number] DOB:1956/04/10, 66 y.o., male Today's Date: 02/12/2022   PCP: [name], DO REFERRING PROVIDER: Frann Rider, NP    PT End of Session - 02/12/22 1544     Visit Number 2    Number of Visits 13   12+eval   Date for PT Re-Evaluation 05/14/22   Pushed out due to scheduling.   Authorization Type [employer] MEDICARE    PT Start Time 89    PT Stop Time 1615    PT Time Calculation (min) 41 min    Equipment Utilized During Treatment Gait belt    Activity Tolerance Patient tolerated treatment well    Behavior During Therapy WFL for tasks assessed/performed             Past Medical History:  Diagnosis Date   Adrenal hyperplasia ([place])    Anal fistula    CHRONIC   COPD (chronic obstructive pulmonary disease) ([place])    Dyslipidemia    ED (erectile dysfunction)    Hemorrhoid    Hypercholesterolemia    Hypertension    Hypothyroidism    Left sided numbness    Past Surgical History:  Procedure Laterality Date   ANAL FISTULOTOMY N/A 01/30/2014   Procedure: ANAL FISTULOTOMY WITH DRAIN PLACEMENT;  Surgeon: [name], MD;  Location: [facility];  Service: General;  Laterality: N/A;   CHOLECYSTECTOMY  2013   COLONOSCOPY     EVALUATION UNDER ANESTHESIA WITH ANAL FISTULECTOMY N/A 06/26/2014   Procedure: ANAL EXAM UNDER ANESTHESIA ;  Surgeon: [name], MD;  Location: [place] [facility];  Service: General;  Laterality: N/A;   EVALUATION UNDER ANESTHESIA WITH ANAL FISTULECTOMY N/A 04/17/2015   Procedure: EXAM UNDER ANESTHESIA WITH ANAL FISTULOTOMY;  Surgeon: [name], MD;  Location: [place];  Service: General;  Laterality: N/A;   EXAMINATION UNDER ANESTHESIA N/A 01/30/2014   Procedure: EXAM UNDER ANESTHESIA;  Surgeon: [name], MD;  Location: [place];  Service: General;  Laterality: N/A;   INCISION AND DRAINAGE  PERIRECTAL ABSCESS Left 12/27/2013   Procedure: IRRIGATION AND DEBRIDEMENT PERIRECTAL ABSCESS;  Surgeon: Edward Jolly, MD;  Location: [facility];  Service: General;  Laterality: Left;   INCISION AND DRAINAGE PERIRECTAL ABSCESS N/A 02/10/2014   Procedure: IRRIGATION AND DEBRIDEMENT PERIRECTAL ABSCESS;  Surgeon: [name], MD;  Location: [employer];  Service: General;  Laterality: N/A;   INCISION AND DRAINAGE PERIRECTAL ABSCESS  06/26/2014   Procedure: IRRIGATION AND DEBRIDEMENT PERIRECTAL ABSCESS;  Surgeon: [name], MD;  Location: [facility];  Service: General;;   LAPAROSOPCY CONVERTED TO OPEN ILEOCECOSTOMY FOR GANGRENOUS RUPTURED APPENDIX  08-11-2005   ORIF LEFT SCAPHOID FX MID SHAFT  12-27-2003   ORIF RADIUS FX RIGHT MID SHAFT/ CLOSED REDUCTION RADIUS AND ULNAR DISLOCATION WRIST/  I & D AND CLOSURE  11-21-2003   VENTRAL HERNIA REPAIR  2013   Patient Active Problem List   Diagnosis Date Noted   Gait abnormality 09/28/2021   Paresthesia 09/28/2021   Left leg swelling 09/28/2021   Stage 3 severe COPD by GOLD classification ([place]) 05/29/2021   Anal abscess 02/10/2014   Perirectal abscess 12/27/2013   Rectal pain 12/26/2013   Personal history of colonic polyps 12/26/2013   ABNORMAL FINDINGS GI TRACT 03/03/2009   ADRENAL MASS, BILATERAL 02/21/2009   FECAL INCONTINENCE 01/09/2009   CHANGE IN BOWELS 01/09/2009   ABDOMINAL PAIN-RLQ 01/09/2009    ONSET DATE: 01/13/2022  REFERRING DIAG: R20.2 (ICD-10-CM) - Paresthesia of right upper and lower extremity Z86.73 (ICD-10-CM) - History of multiple strokes   THERAPY DIAG:  Other lack of coordination  Unsteadiness on feet  Muscle weakness (generalized)  Other abnormalities of gait and mobility  Rationale for Evaluation and Treatment Rehabilitation  SUBJECTIVE:                                                                                                                                                                                               SUBJECTIVE STATEMENT: CT only showed chronic vessel change.  Pt reports today is just one of those days for his breathing (more labored).   Pt accompanied by: significant other - [name] (wife)  PERTINENT HISTORY: multiple CVA affecting bilaterally-most recent occurred 10/2021, adrenal hyperplasia, COPD stage 3, hypercholesterolemia, HTN  PAIN: Endorses same from eval. Are you having pain?  He states the N/T is aggravating, but not painful.  PRECAUTIONS: Fall  WEIGHT BEARING RESTRICTIONS No  FALLS: Has patient fallen in last 6 months? No - his wife reminds him of incident in the shower where he went onto his hands and knees because he thought he was going to fall.  He pulled himself to his feet using grab bars.  His wife states he is not allowed to shower alone and mostly is supervised in the shower.  LIVING ENVIRONMENT: Lives with: lives with their spouse Lives in: House/apartment Stairs: Yes: External: 5 steps; on right going up, on left going up, and can reach both Has following equipment at home: Single point cane, Wheelchair (manual), Grab bars, and considering purchasing a shower chair  PLOF: Requires assistive device for independence and he states he needs extra time to get his balance.  PATIENT GOALS "To be well"  OBJECTIVE:   TODAY'S TREATMENT:  Today's Vitals   02/12/22 1536 02/12/22 1540  BP: (!) 162/113 (!) 148/85  Pulse: 68 64  SpO2: 95% 98%   Assessed 6MWT w/ SPC:  383'; total distance after end of testing 450'  SpO2 following 97%, HR 80bpm; pt severely dyspneic, prolonged seated rest.  Assessed FGA:  [facility] PT Assessment - 02/12/22 1556       Functional Gait  Assessment   Gait assessed  Yes    Gait Level Surface Walks 20 ft in less than 7 sec but greater than 5.5 sec, uses assistive device, slower speed, mild gait deviations, or deviates 6-10 in outside of the 12 in walkway width.    Change in Gait Speed Makes only minor  adjustments to walking speed, or accomplishes a change in speed with significant gait deviations,  deviates 10-15 in outside the 12 in walkway width, or changes speed but loses balance but is able to recover and continue walking.    Gait with Horizontal Head Turns Performs head turns smoothly with slight change in gait velocity (eg, minor disruption to smooth gait path), deviates 6-10 in outside 12 in walkway width, or uses an assistive device.    Gait with Vertical Head Turns Performs task with slight change in gait velocity (eg, minor disruption to smooth gait path), deviates 6 - 10 in outside 12 in walkway width or uses assistive device    Gait and Pivot Turn Pivot turns safely in greater than 3 sec and stops with no loss of balance, or pivot turns safely within 3 sec and stops with mild imbalance, requires small steps to catch balance.    Step Over Obstacle Is able to step over one shoe box (4.5 in total height) but must slow down and adjust steps to clear box safely. May require verbal cueing.    Gait with Narrow Base of Support Ambulates less than 4 steps heel to toe or cannot perform without assistance.   uses SPC and requires intermittent CGA   Gait with Eyes Closed Walks 20 ft, slow speed, abnormal gait pattern, evidence for imbalance, deviates 10-15 in outside 12 in walkway width. Requires more than 9 sec to ambulate 20 ft.   deviates to the left   Ambulating Backwards Walks 20 ft, uses assistive device, slower speed, mild gait deviations, deviates 6-10 in outside 12 in walkway width.    Steps Two feet to a stair, must use rail.    Total Score 14    FGA comment: High fall risk            SpO2 92% following, 2 minutes recovery to 94%  Initiated HEP.  See below for details. STS 2x8 w/ hands on knees  SpO2 96% at end of session.  PATIENT EDUCATION: Education details: Initial HEP and walking program.  Edu on monitoring O2 w/ pulse ox available at home especially on days of increased  SOB.  Edu on no exercise if O2 below 90%. Person educated: Patient and Spouse Education method: Explanation Education comprehension: verbalized understanding and needs further education   HOME EXERCISE PROGRAM: Access Code: [facility] URL: [email or web address] Date: 02/12/2022 Prepared by: Elease Etienne  Exercises - Sit to Stand  - 1 x daily - 5 x weekly - 2-3 sets - 8 reps Walking program:  2 minutes 3-4x per day 3-4 days per week with supervision from wife. GOALS: Goals reviewed with patient? Yes  SHORT TERM GOALS: Target date: 03/05/2022  Pt will be independent in performance of initial strength and balance HEP with supervision from family. Baseline:  To be established. Goal status: INITIAL  2.  Pt will decrease 5xSTS to </= 16 seconds in order to demonstrate decreased risk for falls and improved functional bilateral LE strength and power. Baseline: 20.07 seconds w/ BUE support Goal status: INITIAL  3.  Pt will demonstrate a gait speed of >/=2.5 feet/sec w/ LRAD in order to decrease risk for falls. Baseline: 2.16 ft/sec w/ SPC Goal status: INITIAL  4.  Pt will ambulate >/=400' feet on 6MWT to demonstrate improved functional endurance for home and community participation. Baseline: 383' Goal status: INITIAL  5. Pt will improve FGA score to >/=18/30 in order to demonstrate improved balance and decreased fall risk. Baseline: 14/30 Goal status: INITIAL  LONG TERM GOALS: Target date: 04/02/2022 (8 weeks) 04/30/2022 (12 weeks)  Pt will be independent with finalized strength and balance HEP with supervision from family. Baseline: To be established. Goal status: INITIAL  2.  Pt will decrease 5xSTS to </=13 seconds in order to demonstrate decreased risk for falls and improved functional bilateral LE strength and power. Baseline: 20.07 seconds w/ BUE support Goal status: INITIAL  3.  Pt will demonstrate a gait speed of >/=3.0 feet/sec w/ LRAD in order to  decrease risk for falls. Baseline: 2.16 ft/sec w/ SPC Goal status: INITIAL  4.  Pt will ambulate >/=500 feet on 6MWT to demonstrate improved functional endurance for home and community participation. Baseline: 383' Goal status: INITIAL  5.  Pt will improve FGA score to >/=22/30 in order to demonstrate improved balance and decreased fall risk. Baseline: 14/30 Goal status: INITIAL  ASSESSMENT:  CLINICAL IMPRESSION: Assessed 6MWT this session with pt dyspneic maintaining stable SpO2 for 383'.  Upon assessing FGA pt scores 14/30 indicating significant fall risk.  Initiated walking program to promote endurance and basic HEP focused on functional BLE strength.  Will continue to progress towards goals as able per POC.  OBJECTIVE IMPAIRMENTS Abnormal gait, decreased activity tolerance, decreased balance, decreased coordination, decreased endurance, decreased knowledge of condition, decreased knowledge of use of DME, decreased mobility, difficulty walking, decreased ROM, decreased strength, increased edema, and impaired sensation.   ACTIVITY LIMITATIONS carrying, standing, bathing, and locomotion level  PARTICIPATION LIMITATIONS: community activity  PERSONAL FACTORS Age, Education, Fitness, Past/current experiences, Time since onset of injury/illness/exacerbation, and 3+ comorbidities: multiple CVA affecting bilaterally, COPD stage 3, and HTN  are also affecting patient's functional outcome.   REHAB POTENTIAL: Fair See personal factors and PMH  CLINICAL DECISION MAKING: Evolving/moderate complexity  EVALUATION COMPLEXITY: Moderate  PLAN: PT FREQUENCY: 1x/week  PT DURATION: 12 weeks  PLANNED INTERVENTIONS: Therapeutic exercises, Therapeutic activity, Neuromuscular re-education, Balance training, Gait training, Patient/Family education, Joint mobilization, Stair training, Vestibular training, DME instructions, Manual therapy, and Re-evaluation  PLAN FOR NEXT SESSION: Update HEP for LE  strength and balance prn, stair training, gait training w/ and w/o AD, SciFit for endurance, monitor spO2,   Bary Richard, PT, DPT 02/12/2022, 4:36 PM

## 2022-02-12 NOTE — Therapy (Signed)
OUTPATIENT OCCUPATIONAL THERAPY NEURO EVALUATION  Patient Name: Todd Elliott MRN: [id number] DOB:May 03, 1956, 65 y.o., male Today's Date: 02/12/2022  PCP: Irene Pap REFERRING PROVIDER: Frann Rider   OT End of Session - 02/12/22 1449     Visit Number 2    Number of Visits 5    Date for OT Re-Evaluation 03/15/22    Authorization Type [employer]    OT Start Time 7253    OT Stop Time 6644    OT Time Calculation (min) 41 min    Activity Tolerance Patient tolerated treatment well    Behavior During Therapy WFL for tasks assessed/performed             Past Medical History:  Diagnosis Date   Adrenal hyperplasia ([place])    Anal fistula    CHRONIC   COPD (chronic obstructive pulmonary disease) ([place])    Dyslipidemia    ED (erectile dysfunction)    Hemorrhoid    Hypercholesterolemia    Hypertension    Hypothyroidism    Left sided numbness    Past Surgical History:  Procedure Laterality Date   ANAL FISTULOTOMY N/A 01/30/2014   Procedure: ANAL FISTULOTOMY WITH DRAIN PLACEMENT;  Surgeon: [name], MD;  Location: [facility];  Service: General;  Laterality: N/A;   CHOLECYSTECTOMY  2013   COLONOSCOPY     EVALUATION UNDER ANESTHESIA WITH ANAL FISTULECTOMY N/A 06/26/2014   Procedure: ANAL EXAM UNDER ANESTHESIA ;  Surgeon: [name], MD;  Location: [place];  Service: General;  Laterality: N/A;   EVALUATION UNDER ANESTHESIA WITH ANAL FISTULECTOMY N/A 04/17/2015   Procedure: EXAM UNDER ANESTHESIA WITH ANAL FISTULOTOMY;  Surgeon: [name], MD;  Location: [place];  Service: General;  Laterality: N/A;   EXAMINATION UNDER ANESTHESIA N/A 01/30/2014   Procedure: EXAM UNDER ANESTHESIA;  Surgeon: [name], MD;  Location: [place];  Service: General;  Laterality: N/A;   INCISION AND DRAINAGE PERIRECTAL ABSCESS Left 12/27/2013   Procedure: IRRIGATION AND DEBRIDEMENT PERIRECTAL ABSCESS;  Surgeon:  Edward Jolly, MD;  Location: [facility];  Service: General;  Laterality: Left;   INCISION AND DRAINAGE PERIRECTAL ABSCESS N/A 02/10/2014   Procedure: IRRIGATION AND DEBRIDEMENT PERIRECTAL ABSCESS;  Surgeon: [name], MD;  Location: [place];  Service: General;  Laterality: N/A;   INCISION AND DRAINAGE PERIRECTAL ABSCESS  06/26/2014   Procedure: IRRIGATION AND DEBRIDEMENT PERIRECTAL ABSCESS;  Surgeon: [name], MD;  Location: [place];  Service: General;;   LAPAROSOPCY CONVERTED TO OPEN ILEOCECOSTOMY FOR GANGRENOUS RUPTURED APPENDIX  08-11-2005   ORIF LEFT SCAPHOID FX MID SHAFT  12-27-2003   ORIF RADIUS FX RIGHT MID SHAFT/ CLOSED REDUCTION RADIUS AND ULNAR DISLOCATION WRIST/  I & D AND CLOSURE  11-21-2003   VENTRAL HERNIA REPAIR  2013   Patient Active Problem List   Diagnosis Date Noted   Gait abnormality 09/28/2021   Paresthesia 09/28/2021   Left leg swelling 09/28/2021   Stage 3 severe COPD by GOLD classification ([place]) 05/29/2021   Anal abscess 02/10/2014   Perirectal abscess 12/27/2013   Rectal pain 12/26/2013   Personal history of colonic polyps 12/26/2013   ABNORMAL FINDINGS GI TRACT 03/03/2009   ADRENAL MASS, BILATERAL 02/21/2009   FECAL INCONTINENCE 01/09/2009   CHANGE IN BOWELS 01/09/2009   ABDOMINAL PAIN-RLQ 01/09/2009    ONSET DATE: 02/2021 - Left sided numbness, 10/15/2021 - diagnosed with multiple strokes  REFERRING DIAG: R20.2ICD-10-CMParesthesia of right upper and lower extremity  Z86.73ICD-10-CMHistory of  multiple strokes  THERAPY DIAG:  Other disturbances of skin sensation  Other lack of coordination  Unsteadiness on feet  Muscle weakness (generalized)  Rationale for Evaluation and Treatment Rehabilitation  SUBJECTIVE:   SUBJECTIVE STATEMENT: "It's like my hands and feet are asleep"  Pt accompanied by: significant other  PERTINENT HISTORY: HTN, COPD, Multiple strokes - chronic  PRECAUTIONS: Fall  WEIGHT BEARING  RESTRICTIONS No  PAIN:  Are you having pain? No - pt reports uncomfortable  PATIENT GOALS Reduce numbness  OBJECTIVE:   HAND FUNCTION: Grip strength: Right: 77.8 lbs; Left: 85.7 lbs and Lateral pinch: Right: 30 lbs, Left: 31 lbs  COORDINATION: Finger Nose Finger test: Mild undershooting BUE 9 Hole Peg test: Right: 34.22 sec; Left: 31.28 sec   TODAY'S TREATMENT:  02/12/22 Transfer Tub bench information and education. Instructed patient and spouse on using tub bench, grab bar and sitting down to complete dressing d/t balance and SOB   PATIENT EDUCATION: 02/12/22  Education details: Jeneen Rinks handout on Radio broadcast assistant for Bathing Person educated: Patient and Spouse Education method: Explanation, Demonstration, and Handouts Education comprehension: verbalized understanding and needs further education   HOME EXERCISE PROGRAM: 02/12/22 Coordination HEP BUE, issued information about tub bench   GOALS: Goals reviewed with patient? Yes    LONG TERM GOALS: Target date: 03/15/22  Patient will complete HEP designed to address BUE coordination and strength Baseline:  Goal status: INITIAL  2.  Patient and wife will demonstrate understanding regarding tub/shower equipment/ recommendations Baseline:  Goal status: INITIAL  3.  Patient will complete modified simple yard work x 5-10 minutes Baseline:  Goal status: INITIAL  4.  Patient will demonstrate 2 sec reduction in time for right 9 hole peg test Baseline:  Goal status: INITIAL  5.  Patient will report reduced overall time to dress himself daily Baseline:  Goal status: INITIAL  ASSESSMENT:  CLINICAL IMPRESSION: 02/12/22 Pt with increased SOB today with talking and exertion. Encouraged patient to change ways of dressing and bathing to include sitting with equipment recommendations.  PERFORMANCE DEFICITS in functional skills including coordination, dexterity, sensation, edema, pain, flexibility, GMC, balance,  endurance, decreased knowledge of precautions, decreased knowledge of use of DME, and UE functional use, cognitive skills including memory, and psychosocial skills including habits.   IMPAIRMENTS are limiting patient from ADLs and IADLs.   COMORBIDITIES may have co-morbidities  that affects occupational performance. Patient will benefit from skilled OT to address above impairments and improve overall function.  MODIFICATION OR ASSISTANCE TO COMPLETE EVALUATION: Min-Moderate modification of tasks or assist with assess necessary to complete an evaluation.  OT OCCUPATIONAL PROFILE AND HISTORY: Detailed assessment: Review of records and additional review of physical, cognitive, psychosocial history related to current functional performance.  CLINICAL DECISION MAKING: Moderate - several treatment options, min-mod task modification necessary  REHAB POTENTIAL: Good  EVALUATION COMPLEXITY: Moderate    PLAN: OT FREQUENCY: 1x/week  OT DURATION: 4 weeks  PLANNED INTERVENTIONS: self care/ADL training, therapeutic exercise, therapeutic activity, neuromuscular re-education, balance training, functional mobility training, aquatic therapy, ultrasound, patient/family education, and DME and/or AE instructions  RECOMMENDED OTHER SERVICES: NA  CONSULTED AND AGREED WITH PLAN OF CARE: Patient and family member/caregiver  PLAN FOR NEXT SESSION: 02/12/22 see if received equipment for bathing and if sitting for dressing, continue with BUE coordination.   Zachery Conch, OT 02/12/2022, 3:49 PM

## 2022-02-12 NOTE — Patient Instructions (Signed)
Basic Activities:    Use your affected hand to perform the following activities for 20-30 minutes 1-2 times/day.  Stop activity if you experience pain.   - Flip playing cards - Deal top card with thumb - Toss ball - Rotate ball in your hand  - Turn doorknob - Open/close cabinet door with handle - Pick up coins and place in a container - Stack coins (stacks of 5) and manipulate one at a time to fingertips to place in bank/container - Fold towels - Stack blocks - Pick up 1-inch blocks - Put empty clothes hangers on a rack, remove, and repeat

## 2022-02-19 ENCOUNTER — Ambulatory Visit: Payer: Medicare HMO | Attending: Adult Health | Admitting: Occupational Therapy

## 2022-02-19 ENCOUNTER — Ambulatory Visit: Payer: Medicare HMO | Admitting: Physical Therapy

## 2022-02-19 ENCOUNTER — Encounter: Payer: Self-pay | Admitting: Physical Therapy

## 2022-02-19 ENCOUNTER — Encounter: Payer: Self-pay | Admitting: Occupational Therapy

## 2022-02-19 VITALS — BP 132/76 | HR 82

## 2022-02-19 DIAGNOSIS — R2689 Other abnormalities of gait and mobility: Secondary | ICD-10-CM

## 2022-02-19 DIAGNOSIS — M6281 Muscle weakness (generalized): Secondary | ICD-10-CM

## 2022-02-19 DIAGNOSIS — R2681 Unsteadiness on feet: Secondary | ICD-10-CM

## 2022-02-19 DIAGNOSIS — R202 Paresthesia of skin: Secondary | ICD-10-CM | POA: Diagnosis present

## 2022-02-19 DIAGNOSIS — R208 Other disturbances of skin sensation: Secondary | ICD-10-CM

## 2022-02-19 DIAGNOSIS — R278 Other lack of coordination: Secondary | ICD-10-CM | POA: Diagnosis present

## 2022-02-19 NOTE — Patient Instructions (Signed)
Access Code: [facility] URL: [email or web address] Date: 02/19/2022 Prepared by: Elease Etienne  Exercises - Sit to Stand  - 1 x daily - 5 x weekly - 2-3 sets - 8 reps - Seated Hamstring Curls with Resistance  - 1 x daily - 5 x weekly - 2 sets - 20 reps - Standing Hip Abduction with Counter Support  - 1 x daily - 5 x weekly - 2 sets - 10 reps - Backward Walking with Counter Support  - 1 x daily - 5 x weekly - 3 sets - 10 reps - Seated Heel Raise  - 1 x daily - 5 x weekly - 2 sets - 20 reps

## 2022-02-19 NOTE — Therapy (Signed)
OUTPATIENT PHYSICAL THERAPY NEURO EVALUATION   Patient Name: Todd Elliott MRN: [id number] DOB:[birth year], 66 y.o., male Today's Date: 02/19/2022   PCP: [name], DO REFERRING PROVIDER: Frann Rider, NP    PT End of Session - 02/19/22 1446     Visit Number 3    Number of Visits 13   12+eval   Date for PT Re-Evaluation 05/14/22   Pushed out due to scheduling.   Authorization Type [employer] MEDICARE    PT Start Time 3710    PT Stop Time 1539    PT Time Calculation (min) 54 min    Equipment Utilized During Treatment Gait belt    Activity Tolerance Patient tolerated treatment well    Behavior During Therapy WFL for tasks assessed/performed             Past Medical History:  Diagnosis Date   Adrenal hyperplasia ([place])    Anal fistula    CHRONIC   COPD (chronic obstructive pulmonary disease) ([place])    Dyslipidemia    ED (erectile dysfunction)    Hemorrhoid    Hypercholesterolemia    Hypertension    Hypothyroidism    Left sided numbness    Past Surgical History:  Procedure Laterality Date   ANAL FISTULOTOMY N/A 01/30/2014   Procedure: ANAL FISTULOTOMY WITH DRAIN PLACEMENT;  Surgeon: [name], MD;  Location: [place];  Service: General;  Laterality: N/A;   CHOLECYSTECTOMY  2013   COLONOSCOPY     EVALUATION UNDER ANESTHESIA WITH ANAL FISTULECTOMY N/A 06/26/2014   Procedure: ANAL EXAM UNDER ANESTHESIA ;  Surgeon: [name], MD;  Location: [place];  Service: General;  Laterality: N/A;   EVALUATION UNDER ANESTHESIA WITH ANAL FISTULECTOMY N/A 04/17/2015   Procedure: EXAM UNDER ANESTHESIA WITH ANAL FISTULOTOMY;  Surgeon: [name], MD;  Location: [place];  Service: General;  Laterality: N/A;   EXAMINATION UNDER ANESTHESIA N/A 01/30/2014   Procedure: EXAM UNDER ANESTHESIA;  Surgeon: [name], MD;  Location: [place];  Service: General;  Laterality: N/A;   INCISION AND DRAINAGE  PERIRECTAL ABSCESS Left 12/27/2013   Procedure: IRRIGATION AND DEBRIDEMENT PERIRECTAL ABSCESS;  Surgeon: Edward Jolly, MD;  Location: [facility];  Service: General;  Laterality: Left;   INCISION AND DRAINAGE PERIRECTAL ABSCESS N/A 02/10/2014   Procedure: IRRIGATION AND DEBRIDEMENT PERIRECTAL ABSCESS;  Surgeon: [name], MD;  Location: [place];  Service: General;  Laterality: N/A;   INCISION AND DRAINAGE PERIRECTAL ABSCESS  06/26/2014   Procedure: IRRIGATION AND DEBRIDEMENT PERIRECTAL ABSCESS;  Surgeon: [name], MD;  Location: [place];  Service: General;;   LAPAROSOPCY CONVERTED TO OPEN ILEOCECOSTOMY FOR GANGRENOUS RUPTURED APPENDIX  08-11-2005   ORIF LEFT SCAPHOID FX MID SHAFT  12-27-2003   ORIF RADIUS FX RIGHT MID SHAFT/ CLOSED REDUCTION RADIUS AND ULNAR DISLOCATION WRIST/  I & D AND CLOSURE  11-21-2003   VENTRAL HERNIA REPAIR  2013   Patient Active Problem List   Diagnosis Date Noted   Gait abnormality 09/28/2021   Paresthesia 09/28/2021   Left leg swelling 09/28/2021   Stage 3 severe COPD by GOLD classification ([place]) 05/29/2021   Anal abscess 02/10/2014   Perirectal abscess 12/27/2013   Rectal pain 12/26/2013   Personal history of colonic polyps 12/26/2013   ABNORMAL FINDINGS GI TRACT 03/03/2009   ADRENAL MASS, BILATERAL 02/21/2009   FECAL INCONTINENCE 01/09/2009   CHANGE IN BOWELS 01/09/2009   ABDOMINAL PAIN-RLQ 01/09/2009    ONSET DATE: 01/13/2022  REFERRING DIAG: R20.2 (ICD-10-CM) - Paresthesia of right upper and lower extremity Z86.73 (ICD-10-CM) - History of multiple strokes   THERAPY DIAG:  Paresthesia of skin  Other lack of coordination  Unsteadiness on feet  Muscle weakness (generalized)  Other abnormalities of gait and mobility  Rationale for Evaluation and Treatment Rehabilitation  SUBJECTIVE:                                                                                                                                                                                               SUBJECTIVE STATEMENT: Pt denies falls.  He states he has been walking up and down the driveway. Pt accompanied by: significant other - [name] (wife)  PERTINENT HISTORY: multiple CVA affecting bilaterally-most recent occurred 10/2021, adrenal hyperplasia, COPD stage 3, hypercholesterolemia, HTN  PAIN: Are you having pain?  He states the N/T in RUE, especially the hand, is worse today and really bothering him.  PRECAUTIONS: Fall  WEIGHT BEARING RESTRICTIONS No  FALLS: Has patient fallen in last 6 months? No - his wife reminds him of incident in the shower where he went onto his hands and knees because he thought he was going to fall.  He pulled himself to his feet using grab bars.  His wife states he is not allowed to shower alone and mostly is supervised in the shower.  LIVING ENVIRONMENT: Lives with: lives with their spouse Lives in: House/apartment Stairs: Yes: External: 5 steps; on right going up, on left going up, and can reach both Has following equipment at home: Single point cane, Wheelchair (manual), Grab bars, and considering purchasing a shower chair  PLOF: Requires assistive device for independence and he states he needs extra time to get his balance.  PATIENT GOALS "To be well"  OBJECTIVE:   TODAY'S TREATMENT:  Today's Vitals   02/19/22 1451  BP: 132/76  Pulse: 82  SpO2: 97%   Seated hamstring curls 2x20 alt LE Standing hip abduction w/ counter support x10 on each LE Backwards walking at countertop 3x8' w/ forward walking 3x8' w/o UE support  SpO2 95% following.  Seated heel raises 2x20 NuStep w/ BUE/BLE x68mn L1 + 3 mins L3 +  4 mins L1 w/ 1 min breaks in between  SpO2 95% at end of session.  PT ambulates with pt 140' from mat table to car CGA due to reported LE fatigue following activity.  Pt is SBA to step up and enter truck.  No LOB or acute gait deviations noted.  PATIENT EDUCATION: Education details:  Extensive discussion of breathing issues and sleep positions to facilitate comfort and continuous sleep as  pt is sleeping on couch with head propped on end of armrest.  Additions to HEP.  Continued to promote monitoring O2 w/ pulse ox available at home especially on days of increased SOB.  Edu on no exercise if O2 below 90%.  Discussed moving legs and using seated peddler or stepper for fluid management via muscle pump mechanism. Person educated: Patient and Spouse Education method: Explanation Education comprehension: verbalized understanding and needs further education   HOME EXERCISE PROGRAM: Access Code: [facility] URL: [email or web address] Date: 02/12/2022 Prepared by: Elease Etienne  Exercises - Sit to Stand  - 1 x daily - 5 x weekly - 2-3 sets - 8 reps - Seated Hamstring Curls with Resistance  - 1 x daily - 5 x weekly - 2 sets - 20 reps- green theraband - Standing Hip Abduction with Counter Support  - 1 x daily - 5 x weekly - 2 sets - 10 reps - Backward Walking with Counter Support  - 1 x daily - 5 x weekly - 3 sets - 10 reps - Seated Heel Raise  - 1 x daily - 5 x weekly - 2 sets - 20 reps Walking program:  2 minutes 3-4x per day 3-4 days per week with supervision from wife.  GOALS: Goals reviewed with patient? Yes  SHORT TERM GOALS: Target date: 03/05/2022  Pt will be independent in performance of initial strength and balance HEP with supervision from family. Baseline:  To be established. Goal status: INITIAL  2.  Pt will decrease 5xSTS to </= 16 seconds in order to demonstrate decreased risk for falls and improved functional bilateral LE strength and power. Baseline: 20.07 seconds w/ BUE support Goal status: INITIAL  3.  Pt will demonstrate a gait speed of >/=2.5 feet/sec w/ LRAD in order to decrease risk for falls. Baseline: 2.16 ft/sec w/ SPC Goal status: INITIAL  4.  Pt will ambulate >/=400' feet on 6MWT to demonstrate improved functional endurance for  home and community participation. Baseline: 383' Goal status: INITIAL  5. Pt will improve FGA score to >/=18/30 in order to demonstrate improved balance and decreased fall risk. Baseline: 14/30 Goal status: INITIAL  LONG TERM GOALS: Target date: 04/02/2022 (8 weeks) 04/30/2022 (12 weeks)  Pt will be independent with finalized strength and balance HEP with supervision from family. Baseline: To be established. Goal status: INITIAL  2.  Pt will decrease 5xSTS to </=13 seconds in order to demonstrate decreased risk for falls and improved functional bilateral LE strength and power. Baseline: 20.07 seconds w/ BUE support Goal status: INITIAL  3.  Pt will demonstrate a gait speed of >/=3.0 feet/sec w/ LRAD in order to decrease risk for falls. Baseline: 2.16 ft/sec w/ SPC Goal status: INITIAL  4.  Pt will ambulate >/=500 feet on 6MWT to demonstrate improved functional endurance for home and community participation. Baseline: 383' Goal status: INITIAL  5.  Pt will improve FGA score to >/=22/30 in order to demonstrate improved balance and decreased fall risk. Baseline: 14/30 Goal status: INITIAL  ASSESSMENT:  CLINICAL IMPRESSION: Continued to address activity intolerance in session today w/ emphasis on pacing of activity and high repetitions and inc length of performance of each activity to promote endurance.  Made additions to HEP to provide seated and standing activity variations for strength and some dynamic balance with supervision from wife.  Discussed positioning to promote improved sleep and breathing during rest.  Will further address deficits with skilled PT interventions as outlined in POC.  OBJECTIVE IMPAIRMENTS Abnormal gait,  decreased activity tolerance, decreased balance, decreased coordination, decreased endurance, decreased knowledge of condition, decreased knowledge of use of DME, decreased mobility, difficulty walking, decreased ROM, decreased strength, increased edema, and  impaired sensation.   ACTIVITY LIMITATIONS carrying, standing, bathing, and locomotion level  PARTICIPATION LIMITATIONS: community activity  PERSONAL FACTORS Age, Education, Fitness, Past/current experiences, Time since onset of injury/illness/exacerbation, and 3+ comorbidities: multiple CVA affecting bilaterally, COPD stage 3, and HTN  are also affecting patient's functional outcome.   REHAB POTENTIAL: [place] personal factors and PMH  CLINICAL DECISION MAKING: Evolving/moderate complexity  EVALUATION COMPLEXITY: Moderate  PLAN: PT FREQUENCY: 1x/week  PT DURATION: 12 weeks  PLANNED INTERVENTIONS: Therapeutic exercises, Therapeutic activity, Neuromuscular re-education, Balance training, Gait training, Patient/Family education, Joint mobilization, Stair training, Vestibular training, DME instructions, Manual therapy, and Re-evaluation  PLAN FOR NEXT SESSION: Update HEP for LE strength and balance prn, stair training, gait training w/ and w/o AD, SciFit for endurance, monitor spO2, hurdles, step ups, ladder drills for step length   Bary Richard, PT, DPT 02/19/2022, 3:51 PM

## 2022-02-19 NOTE — Therapy (Signed)
OUTPATIENT OCCUPATIONAL THERAPY NEURO EVALUATION  Patient Name: Todd Elliott MRN: [id number] DOB:[birth year], 66 y.o., male Today's Date: 02/19/2022  PCP: Irene Pap REFERRING PROVIDER: Frann Rider   OT End of Session - 02/19/22 1405     Visit Number 3    Number of Visits 5    Date for OT Re-Evaluation 03/15/22    Authorization Type [employer]    OT Start Time 8338    OT Stop Time 2505    OT Time Calculation (min) 38 min    Activity Tolerance Patient tolerated treatment well    Behavior During Therapy WFL for tasks assessed/performed             Past Medical History:  Diagnosis Date   Adrenal hyperplasia ([place])    Anal fistula    CHRONIC   COPD (chronic obstructive pulmonary disease) ([place])    Dyslipidemia    ED (erectile dysfunction)    Hemorrhoid    Hypercholesterolemia    Hypertension    Hypothyroidism    Left sided numbness    Past Surgical History:  Procedure Laterality Date   ANAL FISTULOTOMY N/A 01/30/2014   Procedure: ANAL FISTULOTOMY WITH DRAIN PLACEMENT;  Surgeon: [name], MD;  Location: [place];  Service: General;  Laterality: N/A;   CHOLECYSTECTOMY  2013   COLONOSCOPY     EVALUATION UNDER ANESTHESIA WITH ANAL FISTULECTOMY N/A 06/26/2014   Procedure: ANAL EXAM UNDER ANESTHESIA ;  Surgeon: [name], MD;  Location: [place];  Service: General;  Laterality: N/A;   EVALUATION UNDER ANESTHESIA WITH ANAL FISTULECTOMY N/A 04/17/2015   Procedure: EXAM UNDER ANESTHESIA WITH ANAL FISTULOTOMY;  Surgeon: [name], MD;  Location: [place];  Service: General;  Laterality: N/A;   EXAMINATION UNDER ANESTHESIA N/A 01/30/2014   Procedure: EXAM UNDER ANESTHESIA;  Surgeon: [name], MD;  Location: [place];  Service: General;  Laterality: N/A;   INCISION AND DRAINAGE PERIRECTAL ABSCESS Left 12/27/2013   Procedure: IRRIGATION AND DEBRIDEMENT PERIRECTAL ABSCESS;  Surgeon:  Edward Jolly, MD;  Location: [facility];  Service: General;  Laterality: Left;   INCISION AND DRAINAGE PERIRECTAL ABSCESS N/A 02/10/2014   Procedure: IRRIGATION AND DEBRIDEMENT PERIRECTAL ABSCESS;  Surgeon: [name], MD;  Location: [place];  Service: General;  Laterality: N/A;   INCISION AND DRAINAGE PERIRECTAL ABSCESS  06/26/2014   Procedure: IRRIGATION AND DEBRIDEMENT PERIRECTAL ABSCESS;  Surgeon: [name], MD;  Location: [place];  Service: General;;   LAPAROSOPCY CONVERTED TO OPEN ILEOCECOSTOMY FOR GANGRENOUS RUPTURED APPENDIX  08-11-2005   ORIF LEFT SCAPHOID FX MID SHAFT  12-27-2003   ORIF RADIUS FX RIGHT MID SHAFT/ CLOSED REDUCTION RADIUS AND ULNAR DISLOCATION WRIST/  I & D AND CLOSURE  11-21-2003   VENTRAL HERNIA REPAIR  2013   Patient Active Problem List   Diagnosis Date Noted   Gait abnormality 09/28/2021   Paresthesia 09/28/2021   Left leg swelling 09/28/2021   Stage 3 severe COPD by GOLD classification ([place]) 05/29/2021   Anal abscess 02/10/2014   Perirectal abscess 12/27/2013   Rectal pain 12/26/2013   Personal history of colonic polyps 12/26/2013   ABNORMAL FINDINGS GI TRACT 03/03/2009   ADRENAL MASS, BILATERAL 02/21/2009   FECAL INCONTINENCE 01/09/2009   CHANGE IN BOWELS 01/09/2009   ABDOMINAL PAIN-RLQ 01/09/2009    ONSET DATE: 02/2021 - Left sided numbness, 10/15/2021 - diagnosed with multiple strokes  REFERRING DIAG: R20.2ICD-10-CMParesthesia of right upper and lower extremity  Z86.73ICD-10-CMHistory of  multiple strokes  THERAPY DIAG:  Other disturbances of skin sensation  Other lack of coordination  Unsteadiness on feet  Muscle weakness (generalized)  Other abnormalities of gait and mobility  Rationale for Evaluation and Treatment Rehabilitation  SUBJECTIVE:   SUBJECTIVE STATEMENT: 02/19/22 "My hand is more numb today"  "Just a week older"  Pt accompanied by: significant other  PERTINENT HISTORY: HTN, COPD, Multiple  strokes - chronic  PRECAUTIONS: Fall  PAIN:  Are you having pain? No - pt reports uncomfortable  PATIENT GOALS Reduce numbness  OBJECTIVE:   HAND FUNCTION: Grip strength: Right: 77.8 lbs; Left: 85.7 lbs and Lateral pinch: Right: 30 lbs, Left: 31 lbs  COORDINATION: Finger Nose Finger test: Mild undershooting BUE 9 Hole Peg test: Right: 34.22 sec; Left: 31.28 sec   TODAY'S TREATMENT:  02/19/22  Small Peg Board with RUE with one at a time for placing pegs into board. Pt removed with in hand manipulation. Pt copied pattern with 100% accuracy with exception of color discrepancies with blue/purple and red/orange. Pt completed with min difficulty and drops.  Nuts and Bolts with bimanual coordination with min difficulty/drops Arm Bike: for conditioning and reciprocal movements on Level 1 for 6 minutes (forward and backward)    HOME EXERCISE PROGRAM: 02/13/22 Coordination HEP BUE, issued information about tub bench   GOALS: Goals reviewed with patient? Yes    LONG TERM GOALS: Target date: 03/15/22  Patient will complete HEP designed to address BUE coordination and strength Baseline:  Goal status: IN PROGRESS  2.  Patient and wife will demonstrate understanding regarding tub/shower equipment/ recommendations Baseline:  Goal status: IN PROGRESS  3.  Patient will complete modified simple yard work x 5-10 minutes Baseline:  Goal status: IN PROGRESS  4.  Patient will demonstrate 2 sec reduction in time for right 9 hole peg test Baseline:  Goal status: INITIAL  5.  Patient will report reduced overall time to dress himself daily Baseline:  Goal status: INITIAL  ASSESSMENT:  CLINICAL IMPRESSION: 02/19/22 Pt continues to report numbness in RUE.  PERFORMANCE DEFICITS in functional skills including coordination, dexterity, sensation, edema, pain, flexibility, GMC, balance, endurance, decreased knowledge of precautions, decreased knowledge of use of DME, and UE functional  use, cognitive skills including memory, and psychosocial skills including habits.   IMPAIRMENTS are limiting patient from ADLs and IADLs.   COMORBIDITIES may have co-morbidities  that affects occupational performance. Patient will benefit from skilled OT to address above impairments and improve overall function.  MODIFICATION OR ASSISTANCE TO COMPLETE EVALUATION: Min-Moderate modification of tasks or assist with assess necessary to complete an evaluation.  OT OCCUPATIONAL PROFILE AND HISTORY: Detailed assessment: Review of records and additional review of physical, cognitive, psychosocial history related to current functional performance.  CLINICAL DECISION MAKING: Moderate - several treatment options, min-mod task modification necessary  REHAB POTENTIAL: Good  EVALUATION COMPLEXITY: Moderate    PLAN: OT FREQUENCY: 1x/week  OT DURATION: 4 weeks  PLANNED INTERVENTIONS: self care/ADL training, therapeutic exercise, therapeutic activity, neuromuscular re-education, balance training, functional mobility training, aquatic therapy, ultrasound, patient/family education, and DME and/or AE instructions  RECOMMENDED OTHER SERVICES: NA  CONSULTED AND AGREED WITH PLAN OF CARE: Patient and family member/caregiver  PLAN FOR NEXT SESSION: 02/19/22 continue with BUE coordination, dressing techniques and activity tolerance   Zachery Conch, OT 02/19/2022, 2:44 PM

## 2022-02-26 ENCOUNTER — Encounter: Payer: Self-pay | Admitting: Physical Therapy

## 2022-02-26 ENCOUNTER — Ambulatory Visit: Payer: Medicare HMO | Admitting: Occupational Therapy

## 2022-02-26 ENCOUNTER — Encounter: Payer: Self-pay | Admitting: Occupational Therapy

## 2022-02-26 ENCOUNTER — Ambulatory Visit: Payer: Medicare HMO | Admitting: Physical Therapy

## 2022-02-26 VITALS — BP 155/93 | HR 84

## 2022-02-26 DIAGNOSIS — R208 Other disturbances of skin sensation: Secondary | ICD-10-CM | POA: Diagnosis not present

## 2022-02-26 DIAGNOSIS — R2681 Unsteadiness on feet: Secondary | ICD-10-CM

## 2022-02-26 DIAGNOSIS — R202 Paresthesia of skin: Secondary | ICD-10-CM

## 2022-02-26 DIAGNOSIS — R2689 Other abnormalities of gait and mobility: Secondary | ICD-10-CM

## 2022-02-26 DIAGNOSIS — M6281 Muscle weakness (generalized): Secondary | ICD-10-CM

## 2022-02-26 DIAGNOSIS — R278 Other lack of coordination: Secondary | ICD-10-CM

## 2022-02-26 NOTE — Therapy (Signed)
OUTPATIENT OCCUPATIONAL THERAPY NEURO EVALUATION  Patient Name: Todd Elliott MRN: [id number] DOB:Nov 07, 1955, 66 y.o., male Today's Date: 02/26/2022  PCP: Irene Pap REFERRING PROVIDER: Frann Rider   OT End of Session - 02/26/22 1406     Visit Number 4    Number of Visits 5    Date for OT Re-Evaluation 03/15/22    Authorization Type [employer]    OT Start Time 4709    OT Stop Time 6283    OT Time Calculation (min) 40 min    Activity Tolerance Patient tolerated treatment well    Behavior During Therapy WFL for tasks assessed/performed             Past Medical History:  Diagnosis Date   Adrenal hyperplasia ([place])    Anal fistula    CHRONIC   COPD (chronic obstructive pulmonary disease) ([place])    Dyslipidemia    ED (erectile dysfunction)    Hemorrhoid    Hypercholesterolemia    Hypertension    Hypothyroidism    Left sided numbness    Past Surgical History:  Procedure Laterality Date   ANAL FISTULOTOMY N/A 01/30/2014   Procedure: ANAL FISTULOTOMY WITH DRAIN PLACEMENT;  Surgeon: [name], MD;  Location: [place];  Service: General;  Laterality: N/A;   CHOLECYSTECTOMY  2013   COLONOSCOPY     EVALUATION UNDER ANESTHESIA WITH ANAL FISTULECTOMY N/A 06/26/2014   Procedure: ANAL EXAM UNDER ANESTHESIA ;  Surgeon: [name], MD;  Location: [place] [facility];  Service: General;  Laterality: N/A;   EVALUATION UNDER ANESTHESIA WITH ANAL FISTULECTOMY N/A 04/17/2015   Procedure: EXAM UNDER ANESTHESIA WITH ANAL FISTULOTOMY;  Surgeon: [name], MD;  Location: [place];  Service: General;  Laterality: N/A;   EXAMINATION UNDER ANESTHESIA N/A 01/30/2014   Procedure: EXAM UNDER ANESTHESIA;  Surgeon: [name], MD;  Location: [place];  Service: General;  Laterality: N/A;   INCISION AND DRAINAGE PERIRECTAL ABSCESS Left 12/27/2013   Procedure: IRRIGATION AND DEBRIDEMENT PERIRECTAL ABSCESS;  Surgeon:  Edward Jolly, MD;  Location: [facility];  Service: General;  Laterality: Left;   INCISION AND DRAINAGE PERIRECTAL ABSCESS N/A 02/10/2014   Procedure: IRRIGATION AND DEBRIDEMENT PERIRECTAL ABSCESS;  Surgeon: [name], MD;  Location: [place];  Service: General;  Laterality: N/A;   INCISION AND DRAINAGE PERIRECTAL ABSCESS  06/26/2014   Procedure: IRRIGATION AND DEBRIDEMENT PERIRECTAL ABSCESS;  Surgeon: [name], MD;  Location: [place];  Service: General;;   LAPAROSOPCY CONVERTED TO OPEN ILEOCECOSTOMY FOR GANGRENOUS RUPTURED APPENDIX  08-11-2005   ORIF LEFT SCAPHOID FX MID SHAFT  12-27-2003   ORIF RADIUS FX RIGHT MID SHAFT/ CLOSED REDUCTION RADIUS AND ULNAR DISLOCATION WRIST/  I & D AND CLOSURE  11-21-2003   VENTRAL HERNIA REPAIR  2013   Patient Active Problem List   Diagnosis Date Noted   Gait abnormality 09/28/2021   Paresthesia 09/28/2021   Left leg swelling 09/28/2021   Stage 3 severe COPD by GOLD classification ([place]) 05/29/2021   Anal abscess 02/10/2014   Perirectal abscess 12/27/2013   Rectal pain 12/26/2013   Personal history of colonic polyps 12/26/2013   ABNORMAL FINDINGS GI TRACT 03/03/2009   ADRENAL MASS, BILATERAL 02/21/2009   FECAL INCONTINENCE 01/09/2009   CHANGE IN BOWELS 01/09/2009   ABDOMINAL PAIN-RLQ 01/09/2009    ONSET DATE: 02/2021 - Left sided numbness, 10/15/2021 - diagnosed with multiple strokes  REFERRING DIAG: R20.2ICD-10-CM Paresthesia of right upper and lower extremity  Z86.73ICD-10-CM  History of multiple strokes  THERAPY DIAG:  Other lack of coordination  Unsteadiness on feet  Muscle weakness (generalized)  Other abnormalities of gait and mobility  Rationale for Evaluation and Treatment Rehabilitation  SUBJECTIVE:   SUBJECTIVE STATEMENT: 02/26/22 "I was so tired and sore"  Pt accompanied by: significant other  PERTINENT HISTORY: HTN, COPD, Multiple strokes - chronic  PRECAUTIONS: Fall  PAIN:  Are you having  pain? No - pt reports uncomfortable  PATIENT GOALS Reduce numbness  OBJECTIVE:   HAND FUNCTION: Grip strength: Right: 77.8 lbs; Left: 85.7 lbs and Lateral pinch: Right: 30 lbs, Left: 31 lbs  COORDINATION: Finger Nose Finger test: Mild undershooting BUE 9 Hole Peg test: Right: 34.22 sec; Left: 31.28 sec   TODAY'S TREATMENT:  02/26/22  Long discussion with patient and spouse re: recommendations for increased safety and energy conservation. Pt hesitant for adopting new ways and adapting tasks to increase safety and conserve energy.  Resistance Clothespins 1-8# with RUE and LUE for mid and high functional reaching and sustained pinch. Pt req'd mod cues for breathing patterns and maintaining good positioning of RUE and LUE with reach. Grooved Pegs with RUE for increased coordination. Pt placed pegs with one at a time and removed with one at a time. Pt completed with min difficulty and min drops.    HOME EXERCISE PROGRAM: 02/13/22 Coordination HEP BUE, issued information about tub bench   GOALS: Goals reviewed with patient? Yes    LONG TERM GOALS: Target date: 03/15/22  Patient will complete HEP designed to address BUE coordination and strength Baseline:  Goal status: IN PROGRESS  2.  Patient and wife will demonstrate understanding regarding tub/shower equipment/ recommendations Baseline:  Goal status: IN PROGRESS  3.  Patient will complete modified simple yard work x 5-10 minutes Baseline:  Goal status: IN PROGRESS  4.  Patient will demonstrate 2 sec reduction in time for right 9 hole peg test Baseline:  Goal status: IN PROGRESS  5.  Patient will report reduced overall time to dress himself daily Baseline:  Goal status: IN PROGRESS  ASSESSMENT:  CLINICAL IMPRESSION: 02/26/22 Pt continues to report numbness in RUE.  PERFORMANCE DEFICITS in functional skills including coordination, dexterity, sensation, edema, pain, flexibility, GMC, balance, endurance, decreased  knowledge of precautions, decreased knowledge of use of DME, and UE functional use, cognitive skills including memory, and psychosocial skills including habits.   IMPAIRMENTS are limiting patient from ADLs and IADLs.   COMORBIDITIES may have co-morbidities  that affects occupational performance. Patient will benefit from skilled OT to address above impairments and improve overall function.  MODIFICATION OR ASSISTANCE TO COMPLETE EVALUATION: Min-Moderate modification of tasks or assist with assess necessary to complete an evaluation.  OT OCCUPATIONAL PROFILE AND HISTORY: Detailed assessment: Review of records and additional review of physical, cognitive, psychosocial history related to current functional performance.  CLINICAL DECISION MAKING: Moderate - several treatment options, min-mod task modification necessary  REHAB POTENTIAL: Good  EVALUATION COMPLEXITY: Moderate    PLAN: OT FREQUENCY: 1x/week  OT DURATION: 4 weeks  PLANNED INTERVENTIONS: self care/ADL training, therapeutic exercise, therapeutic activity, neuromuscular re-education, balance training, functional mobility training, aquatic therapy, ultrasound, patient/family education, and DME and/or AE instructions  RECOMMENDED OTHER SERVICES: NA  CONSULTED AND AGREED WITH PLAN OF CARE: Patient and family member/caregiver  PLAN FOR NEXT SESSION: 02/26/22 continue with BUE coordination, dressing techniques and activity tolerance   Zachery Conch, OT 02/26/2022, 3:30 PM

## 2022-02-26 NOTE — Therapy (Signed)
OUTPATIENT PHYSICAL THERAPY NEURO EVALUATION   Patient Name: Todd Elliott MRN: [id number] DOB:20-Sep-1955, 66 y.o., male Today's Date: 02/26/2022   PCP: [name], DO REFERRING PROVIDER: Frann Rider, NP    PT End of Session - 02/26/22 1446     Visit Number 4    Number of Visits 13   12+eval   Date for PT Re-Evaluation 05/14/22   Pushed out due to scheduling.   Authorization Type [employer]IC[employer]    PT Start Time 3557    PT Stop Time 1530    PT Time Calculation (min) 45 min    Equipment Utilized During Treatment Gait belt    Activity Tolerance Patient tolerated treatment well    Behavior During Therapy WFL for tasks assessed/performed             Past Medical History:  Diagnosis Date   Adrenal hyperplasia ([place])    Anal fistula    CHRONIC   COPD (chronic obstructive pulmonary disease) ([place])    Dyslipidemia    ED (erectile dysfunction)    Hemorrhoid    Hypercholesterolemia    Hypertension    Hypothyroidism    Left sided numbness    Past Surgical History:  Procedure Laterality Date   ANAL FISTULOTOMY N/A 01/30/2014   Procedure: ANAL FISTULOTOMY WITH DRAIN PLACEMENT;  Surgeon: [name], MD;  Location: [place];  Service: General;  Laterality: N/A;   CHOLECYSTECTOMY  2013   COLONOSCOPY     EVALUATION UNDER ANESTHESIA WITH ANAL FISTULECTOMY N/A 06/26/2014   Procedure: ANAL EXAM UNDER ANESTHESIA ;  Surgeon: [name], MD;  Location: [place];  Service: General;  Laterality: N/A;   EVALUATION UNDER ANESTHESIA WITH ANAL FISTULECTOMY N/A 04/17/2015   Procedure: EXAM UNDER ANESTHESIA WITH ANAL FISTULOTOMY;  Surgeon: [name], MD;  Location: [place];  Service: General;  Laterality: N/A;   EXAMINATION UNDER ANESTHESIA N/A 01/30/2014   Procedure: EXAM UNDER ANESTHESIA;  Surgeon: [name], MD;  Location: [place];  Service: General;  Laterality: N/A;   INCISION AND DRAINAGE  PERIRECTAL ABSCESS Left 12/27/2013   Procedure: IRRIGATION AND DEBRIDEMENT PERIRECTAL ABSCESS;  Surgeon: Edward Jolly, MD;  Location[facility];  Service: General;  Laterality: Left;   INCISION AND DRAINAGE PERIRECTAL ABSCESS N/A 02/10/2014   Procedure: IRRIGATION AND DEBRIDEMENT PERIRECTAL ABSCESS;  Surgeon: [name], MD;  Location: [facility];  Service: General;  Laterality: N/A;   INCISION AND DRAINAGE PERIRECTAL ABSCESS  06/26/2014   Procedure: IRRIGATION AND DEBRIDEMENT PERIRECTAL ABSCESS;  Surgeon: [name], MD;  Location: [place];  Service: General;;   LAPAROSOPCY CONVERTED TO OPEN ILEOCECOSTOMY FOR GANGRENOUS RUPTURED APPENDIX  08-11-2005   ORIF LEFT SCAPHOID FX MID SHAFT  12-27-2003   ORIF RADIUS FX RIGHT MID SHAFT/ CLOSED REDUCTION RADIUS AND ULNAR DISLOCATION WRIST/  I & D AND CLOSURE  11-21-2003   VENTRAL HERNIA REPAIR  2013   Patient Active Problem List   Diagnosis Date Noted   Gait abnormality 09/28/2021   Paresthesia 09/28/2021   Left leg swelling 09/28/2021   Stage 3 severe COPD by GOLD classification ([place]) 05/29/2021   Anal abscess 02/10/2014   Perirectal abscess 12/27/2013   Rectal pain 12/26/2013   Personal history of colonic polyps 12/26/2013   ABNORMAL FINDINGS GI TRACT 03/03/2009   ADRENAL MASS, BILATERAL 02/21/2009   FECAL INCONTINENCE 01/09/2009   CHANGE IN BOWELS 01/09/2009   ABDOMINAL PAIN-RLQ 01/09/2009    ONSET DATE: 01/13/2022  REFERRING DIAG: R20.2 (ICD-10-CM) - Paresthesia of right upper and lower extremity Z86.73 (ICD-10-CM) - History of multiple strokes   THERAPY DIAG:  Paresthesia of skin  Other lack of coordination  Unsteadiness on feet  Muscle weakness (generalized)  Other abnormalities of gait and mobility  Rationale for Evaluation and Treatment Rehabilitation  SUBJECTIVE:                                                                                                                                                                                               SUBJECTIVE STATEMENT: Pt denies falls.  He states he had to do his HEP without a band, wife elaborates they forgot band here at last visit. Pt accompanied by: significant other - [name] (wife)  PERTINENT HISTORY: multiple CVA affecting bilaterally-most recent occurred 10/2021, adrenal hyperplasia, COPD stage 3, hypercholesterolemia, HTN  PAIN: Are you having pain?  He states the N/T in RUE, especially the hand, is worse today and really bothering him.  PRECAUTIONS: Fall  WEIGHT BEARING RESTRICTIONS No  FALLS: Has patient fallen in last 6 months? No - his wife reminds him of incident in the shower where he went onto his hands and knees because he thought he was going to fall.  He pulled himself to his feet using grab bars.  His wife states he is not allowed to shower alone and mostly is supervised in the shower.  LIVING ENVIRONMENT: Lives with: lives with their spouse Lives in: House/apartment Stairs: Yes: External: 5 steps; on right going up, on left going up, and can reach both Has following equipment at home: Single point cane, Wheelchair (manual), Grab bars, and considering purchasing a shower chair  PLOF: Requires assistive device for independence and he states he needs extra time to get his balance.  PATIENT GOALS "To be well"  OBJECTIVE:   TODAY'S TREATMENT:  Today's Vitals   02/26/22 1448  BP: (!) 155/93  Pulse: 84  SpO2: 94%   GAIT: Gait pattern: step through pattern, decreased arm swing- Right, decreased arm swing- Left, decreased step length- Right, decreased step length- Left, decreased stride length, decreased hip/knee flexion- Right, decreased hip/knee flexion- Left, decreased ankle dorsiflexion- Right, decreased ankle dorsiflexion- Left, Right foot flat, Left foot flat, and shuffling Distance walked: 230' + 230' Assistive device utilized: Single point cane and None Level of assistance: CGA Comments: O2 96%  following second bout of gait.  Cued pt to inc step size with minimal correction, cued for paced breathing with pt verbalizing preference for breathing through mouth as it is habit.  Pt has wardrobe malfunction, but is able to catch LE  clothing without LOB and self-situate to resume ambulation w/ only CGA.  6" step ups x15 each LE w/ BUE support on rails  STAIRS:  Level of Assistance: SBA and CGA  Stair Negotiation Technique: Alternating Pattern  Forwards with Single Rail on Right Bilateral Rails  Number of Stairs: 8   Height of Stairs: 6"  Comments: O2 following 95%.  HR up to 105, allowed time for it to drop following activity, takes >85mns to drop to 86bpm.  Pt able to progress from use of bilateral rails to single right rail w/o LOB.  Pt able to demonstrate management of LE clothing without PT assistance on second bout.  He would do well to practice stairs w/ use of SPC in the future to promote improved independence.    SciFit hills mode x652m at L3.5 w/ BUE/BLE for cardiovascular endurance and reciprocal mobility. SpO2 94%, HR 90 bpm at end of session.  PT ambulates with pt 120' from mat table to car CGA due to reported LE fatigue following activity.  Pt is SBA to step up and enter truck.   PATIENT EDUCATION: Education details: Continue HEP.  Continued to promote monitoring O2 w/ pulse ox available at home especially on days of increased SOB.  Edu on no exercise if O2 below 90%.  Provided non-emergency nursing hotline number for pt and wife questions. Person educated: Patient and Spouse Education method: Explanation Education comprehension: verbalized understanding and needs further education   HOME EXERCISE PROGRAM: [facility]RL: [email or web address] Date: 02/12/2022 Prepared by: MaElease EtienneExercises - Sit to Stand  - 1 x daily - 5 x weekly - 2-3 sets - 8 reps - Seated Hamstring Curls with Resistance  - 1 x daily - 5 x weekly - 2 sets - 20  reps- green theraband - Standing Hip Abduction with Counter Support  - 1 x daily - 5 x weekly - 2 sets - 10 reps - Backward Walking with Counter Support  - 1 x daily - 5 x weekly - 3 sets - 10 reps - Seated Heel Raise  - 1 x daily - 5 x weekly - 2 sets - 20 reps Walking program:  2 minutes 3-4x per day 3-4 days per week with supervision from wife.  GOALS: Goals reviewed with patient? Yes  SHORT TERM GOALS: Target date: 03/05/2022  Pt will be independent in performance of initial strength and balance HEP with supervision from family. Baseline:  To be established. Goal status: INITIAL  2.  Pt will decrease 5xSTS to </= 16 seconds in order to demonstrate decreased risk for falls and improved functional bilateral LE strength and power. Baseline: 20.07 seconds w/ BUE support Goal status: INITIAL  3.  Pt will demonstrate a gait speed of >/=2.5 feet/sec w/ LRAD in order to decrease risk for falls. Baseline: 2.16 ft/sec w/ SPC Goal status: INITIAL  4.  Pt will ambulate >/=400' feet on 6MWT to demonstrate improved functional endurance for home and community participation. Baseline: 383' Goal status: INITIAL  5. Pt will improve FGA score to >/=18/30 in order to demonstrate improved balance and decreased fall risk. Baseline: 14/30 Goal status: INITIAL  LONG TERM GOALS: Target date: 04/02/2022 (8 weeks) 04/30/2022 (12 weeks)  Pt will be independent with finalized strength and balance HEP with supervision from family. Baseline: To be established. Goal status: INITIAL  2.  Pt will decrease 5xSTS to </=13 seconds in order to demonstrate decreased risk for falls and improved functional bilateral LE strength and  power. Baseline: 20.07 seconds w/ BUE support Goal status: INITIAL  3.  Pt will demonstrate a gait speed of >/=3.0 feet/sec w/ LRAD in order to decrease risk for falls. Baseline: 2.16 ft/sec w/ SPC Goal status: INITIAL  4.  Pt will ambulate >/=500 feet on 6MWT to demonstrate  improved functional endurance for home and community participation. Baseline: 383' Goal status: INITIAL  5.  Pt will improve FGA score to >/=22/30 in order to demonstrate improved balance and decreased fall risk. Baseline: 14/30 Goal status: INITIAL  ASSESSMENT:  CLINICAL IMPRESSION: Gait training performed this session with and without SPC to challenge dynamic balance and endurance of ambulation.  Performed stairs without cane using rails progressed to single right rail and reciprocal gait.  Pt is significantly limited by labored breathing and fatigue, but maintains stable oxygen saturation throughout session.  Will continue to progress towards goals as able.  OBJECTIVE IMPAIRMENTS Abnormal gait, decreased activity tolerance, decreased balance, decreased coordination, decreased endurance, decreased knowledge of condition, decreased knowledge of use of DME, decreased mobility, difficulty walking, decreased ROM, decreased strength, increased edema, and impaired sensation.   ACTIVITY LIMITATIONS carrying, standing, bathing, and locomotion level  PARTICIPATION LIMITATIONS: community activity  PERSONAL FACTORS Age, Education, Fitness, Past/current experiences, Time since onset of injury/illness/exacerbation, and 3+ comorbidities: multiple CVA affecting bilaterally, COPD stage 3, and HTN  are also affecting patient's functional outcome.   REHAB POTENTIAL: Fair See personal factors and PMH  CLINICAL DECISION MAKING: Evolving/moderate complexity  EVALUATION COMPLEXITY: Moderate  PLAN: PT FREQUENCY: 1x/week  PT DURATION: 12 weeks  PLANNED INTERVENTIONS: Therapeutic exercises, Therapeutic activity, Neuromuscular re-education, Balance training, Gait training, Patient/Family education, Joint mobilization, Stair training, Vestibular training, DME instructions, Manual therapy, and Re-evaluation  PLAN FOR NEXT SESSION: Assess STGs.  Update HEP for LE strength and balance prn, stair training, gait  training w/ and w/o AD, SciFit for endurance, monitor spO2, hurdles, step ups, ladder drills for step length   Bary Richard, PT, DPT 02/26/2022, 4:27 PM

## 2022-03-05 ENCOUNTER — Encounter: Payer: Self-pay | Admitting: Occupational Therapy

## 2022-03-05 ENCOUNTER — Telehealth: Payer: Self-pay | Admitting: Physical Therapy

## 2022-03-05 ENCOUNTER — Ambulatory Visit: Payer: Medicare HMO | Admitting: Occupational Therapy

## 2022-03-05 ENCOUNTER — Ambulatory Visit: Payer: Medicare HMO | Admitting: Physical Therapy

## 2022-03-05 ENCOUNTER — Encounter: Payer: Self-pay | Admitting: Physical Therapy

## 2022-03-05 ENCOUNTER — Other Ambulatory Visit: Payer: Medicare HMO

## 2022-03-05 VITALS — BP 126/73 | HR 84 | Resp 47 | Wt 257.6 lb

## 2022-03-05 DIAGNOSIS — M6281 Muscle weakness (generalized): Secondary | ICD-10-CM

## 2022-03-05 DIAGNOSIS — R2681 Unsteadiness on feet: Secondary | ICD-10-CM

## 2022-03-05 DIAGNOSIS — I700 Atherosclerosis of aorta: Secondary | ICD-10-CM

## 2022-03-05 DIAGNOSIS — R278 Other lack of coordination: Secondary | ICD-10-CM

## 2022-03-05 DIAGNOSIS — R2689 Other abnormalities of gait and mobility: Secondary | ICD-10-CM

## 2022-03-05 DIAGNOSIS — R208 Other disturbances of skin sensation: Secondary | ICD-10-CM | POA: Diagnosis not present

## 2022-03-05 DIAGNOSIS — E785 Hyperlipidemia, unspecified: Secondary | ICD-10-CM

## 2022-03-05 NOTE — Therapy (Signed)
OUTPATIENT PHYSICAL THERAPY NEURO TREATMENT NOTE   Patient Name: Todd Elliott MRN: [id number] DOB:11/14/1955, 66 y.o., male Today's Date: 03/05/2022   PCP: [name], DO REFERRING PROVIDER: Frann Rider, NP    PT End of Session - 03/05/22 1506     Visit Number 5    Number of Visits 13   12+eval   Date for PT Re-Evaluation 05/14/22   Pushed out due to scheduling.   Authorization Type [employer] MEDICARE    PT Start Time 1450    PT Stop Time 1522    PT Time Calculation (min) 32 min    Equipment Utilized During Treatment Gait belt    Activity Tolerance Patient tolerated treatment well    Behavior During Therapy WFL for tasks assessed/performed             Past Medical History:  Diagnosis Date   Adrenal hyperplasia ([place])    Anal fistula    CHRONIC   COPD (chronic obstructive pulmonary disease) ([place])    Dyslipidemia    ED (erectile dysfunction)    Hemorrhoid    Hypercholesterolemia    Hypertension    Hypothyroidism    Left sided numbness    Past Surgical History:  Procedure Laterality Date   ANAL FISTULOTOMY N/A 01/30/2014   Procedure: ANAL FISTULOTOMY WITH DRAIN PLACEMENT;  Surgeon: [name], MD;  Location: [facility];  Service: General;  Laterality: N/A;   CHOLECYSTECTOMY  2013   COLONOSCOPY     EVALUATION UNDER ANESTHESIA WITH ANAL FISTULECTOMY N/A 06/26/2014   Procedure: ANAL EXAM UNDER ANESTHESIA ;  Surgeon: [name], MD;  Location: [facility];  Service: General;  Laterality: N/A;   EVALUATION UNDER ANESTHESIA WITH ANAL FISTULECTOMY N/A 04/17/2015   Procedure: EXAM UNDER ANESTHESIA WITH ANAL FISTULOTOMY;  Surgeon: [name], MD;  Location: [facility];  Service: General;  Laterality: N/A;   EXAMINATION UNDER ANESTHESIA N/A 01/30/2014   Procedure: EXAM UNDER ANESTHESIA;  Surgeon: [name], MD;  Location: [facility];  Service: General;  Laterality: N/A;   INCISION AND DRAINAGE  PERIRECTAL ABSCESS Left 12/27/2013   Procedure: IRRIGATION AND DEBRIDEMENT PERIRECTAL ABSCESS;  Surgeon: Edward Jolly, MD;  Location: [facility];  Service: General;  Laterality: Left;   INCISION AND DRAINAGE PERIRECTAL ABSCESS N/A 02/10/2014   Procedure: IRRIGATION AND DEBRIDEMENT PERIRECTAL ABSCESS;  Surgeon: [name], MD;  Location: [facility];  Service: General;  Laterality: N/A;   INCISION AND DRAINAGE PERIRECTAL ABSCESS  06/26/2014   Procedure: IRRIGATION AND DEBRIDEMENT PERIRECTAL ABSCESS;  Surgeon: [name], MD;  Location: [facility];  Service: General;;   LAPAROSOPCY CONVERTED TO OPEN ILEOCECOSTOMY FOR GANGRENOUS RUPTURED APPENDIX  08-11-2005   ORIF LEFT SCAPHOID FX MID SHAFT  12-27-2003   ORIF RADIUS FX RIGHT MID SHAFT/ CLOSED REDUCTION RADIUS AND ULNAR DISLOCATION WRIST/  I & D AND CLOSURE  11-21-2003   VENTRAL HERNIA REPAIR  2013   Patient Active Problem List   Diagnosis Date Noted   Gait abnormality 09/28/2021   Paresthesia 09/28/2021   Left leg swelling 09/28/2021   Stage 3 severe COPD by GOLD classification ([place]) 05/29/2021   Anal abscess 02/10/2014   Perirectal abscess 12/27/2013   Rectal pain 12/26/2013   Personal history of colonic polyps 12/26/2013   ABNORMAL FINDINGS GI TRACT 03/03/2009   ADRENAL MASS, BILATERAL 02/21/2009   FECAL INCONTINENCE 01/09/2009   CHANGE IN BOWELS 01/09/2009   ABDOMINAL PAIN-RLQ 01/09/2009    ONSET DATE:  01/13/2022   REFERRING DIAG: R20.2 (ICD-10-CM) - Paresthesia of right upper and lower extremity Z86.73 (ICD-10-CM) - History of multiple strokes   THERAPY DIAG:  Other lack of coordination  Unsteadiness on feet  Muscle weakness (generalized)  Other abnormalities of gait and mobility  Rationale for Evaluation and Treatment Rehabilitation  SUBJECTIVE:                                                                                                                                                                                               SUBJECTIVE STATEMENT: Pt denies falls.  He and wife state he has had a recent onset of inc swelling in BLE w/ obvious deformity to lower limb from visible edema.  They state it is worse the past 2-3 days, but they do not weigh him as they were not aware they should do this.  They have not been able to get new size of compression stockings yet and current ones do not fit. Pt accompanied by: significant other - [name] (wife)  PERTINENT HISTORY: multiple CVA affecting bilaterally-most recent occurred 10/2021, adrenal hyperplasia, COPD stage 3, hypercholesterolemia, HTN  PAIN: Are you having pain?  He states the N/T in RUE, especially the hand, is worse today and really bothering him.  PRECAUTIONS: Fall  WEIGHT BEARING RESTRICTIONS No  OBJECTIVE:   TODAY'S TREATMENT:  Today's Vitals   03/05/22 1456  BP: 126/73  Pulse: 84  Resp: (!) 47  SpO2: 91%  Weight: 257 lb 9.6 oz (116.8 kg)   RLE Circumferential measurements  Malleolar line 31.7 cm 10 in above medial malleolus (difficult to palpate due to excessive fluid) 41.8 cm   LLE Circumferential measurements  Malleolar line 31.6 cm 10 in above medial malleolus (difficult to palpate due to excessive fluid) 43.7 cm  He denies tenderness to palpation.  Lateral distribution of pitting edema on RLE (2+ pitting).  1+ medially and near tibial plateau.  2+ around bilateral malleoli bilaterally. On LLE 3+ circumferentially from malleolar line to midcalf.  PATIENT EDUCATION: Education details: Continue HEP.  Discussed follow-up with MD, PT reaching out to PCP and cardiologist about recent changes.  Purchase of bigger size of compression stockings.  Edu on ED if SOB worsens or other symptoms of decompensation occur. Person educated: Patient and Spouse Education method: Explanation Education comprehension: verbalized understanding and needs further education   HOME EXERCISE PROGRAM: Access Code: [facility] URL:  [email or web address] Date: 02/12/2022 Prepared by: Elease Etienne  Exercises - Sit to Stand  - 1 x daily - 5 x weekly - 2-3 sets - 8 reps - Seated Hamstring Curls with Resistance  -  1 x daily - 5 x weekly - 2 sets - 20 reps- green theraband - Standing Hip Abduction with Counter Support  - 1 x daily - 5 x weekly - 2 sets - 10 reps - Backward Walking with Counter Support  - 1 x daily - 5 x weekly - 3 sets - 10 reps - Seated Heel Raise  - 1 x daily - 5 x weekly - 2 sets - 20 reps Walking program:  2 minutes 3-4x per day 3-4 days per week with supervision from wife.  GOALS: Goals reviewed with patient? Yes  SHORT TERM GOALS: Target date: 03/05/2022  Pt will be independent in performance of initial strength and balance HEP with supervision from family. Baseline:  Pt states compliance, wife states intermittent compliance to standing exercise and walking. Goal status: MET  2.  Pt will decrease 5xSTS to </= 16 seconds in order to demonstrate decreased risk for falls and improved functional bilateral LE strength and power. Baseline: 20.07 seconds w/ BUE support Goal status: INITIAL  3.  Pt will demonstrate a gait speed of >/=2.5 feet/sec w/ LRAD in order to decrease risk for falls. Baseline: 2.16 ft/sec w/ SPC Goal status: INITIAL  4.  Pt will ambulate >/=400' feet on 6MWT to demonstrate improved functional endurance for home and community participation. Baseline: 383' Goal status: INITIAL  5. Pt will improve FGA score to >/=18/30 in order to demonstrate improved balance and decreased fall risk. Baseline: 14/30 Goal status: INITIAL  LONG TERM GOALS: Target date: 04/02/2022 (8 weeks) 04/30/2022 (12 weeks)  Pt will be independent with finalized strength and balance HEP with supervision from family. Baseline: To be established. Goal status: INITIAL  2.  Pt will decrease 5xSTS to </=13 seconds in order to demonstrate decreased risk for falls and improved functional  bilateral LE strength and power. Baseline: 20.07 seconds w/ BUE support Goal status: INITIAL  3.  Pt will demonstrate a gait speed of >/=3.0 feet/sec w/ LRAD in order to decrease risk for falls. Baseline: 2.16 ft/sec w/ SPC Goal status: INITIAL  4.  Pt will ambulate >/=500 feet on 6MWT to demonstrate improved functional endurance for home and community participation. Baseline: 383' Goal status: INITIAL  5.  Pt will improve FGA score to >/=22/30 in order to demonstrate improved balance and decreased fall risk. Baseline: 14/30 Goal status: INITIAL  ASSESSMENT:  CLINICAL IMPRESSION: Assessment of goals not completed this session due to recent onset of increased edema Left worse than right LE.  PT completed formal assessment of BLE and vitals for baseline to communicate to PCP and cardiologist.  Edu provided to patient and wife on signs/symptoms requiring ED visit for further assessment.  Pt and wife verbalize understanding and were in agreement to not continue session past assessment of current state.  OBJECTIVE IMPAIRMENTS Abnormal gait, decreased activity tolerance, decreased balance, decreased coordination, decreased endurance, decreased knowledge of condition, decreased knowledge of use of DME, decreased mobility, difficulty walking, decreased ROM, decreased strength, increased edema, and impaired sensation.   ACTIVITY LIMITATIONS carrying, standing, bathing, and locomotion level  PARTICIPATION LIMITATIONS: community activity  PERSONAL FACTORS Age, Education, Fitness, Past/current experiences, Time since onset of injury/illness/exacerbation, and 3+ comorbidities: multiple CVA affecting bilaterally, COPD stage 3, and HTN  are also affecting patient's functional outcome.   REHAB POTENTIAL: Fair See personal factors and PMH  CLINICAL DECISION MAKING: Evolving/moderate complexity  EVALUATION COMPLEXITY: Moderate  PLAN: PT FREQUENCY: 1x/week  PT DURATION: 12 weeks  PLANNED  INTERVENTIONS: Therapeutic exercises, Therapeutic  activity, Neuromuscular re-education, Balance training, Gait training, Patient/Family education, Joint mobilization, Stair training, Vestibular training, DME instructions, Manual therapy, and Re-evaluation  PLAN FOR NEXT SESSION: Assess STGs.  Update HEP for LE strength and balance prn, stair training, gait training w/ and w/o AD, SciFit for endurance, monitor spO2, hurdles, step ups, ladder drills for step length   Bary Richard, PT, DPT 03/05/2022, 3:35 PM

## 2022-03-05 NOTE — Telephone Encounter (Signed)
I saw Elishah Ashmore today for physical therapy and he and his wife endorsed a marked worsening of his BLE edema over the last 2-3 days making it more difficult for him to get around.  I did a brief assessment to compare it to his PT evaluation and he does have 3+ pitting edema primarily in the LLE extending higher up his leg than previously (previously 2+).  He was breathing at an increased rate compared to baseline at 47 breaths per minute and his SpO2 was staying a bit lower around 91% today, but all other vitals were normal and there were no notable concerns for DVT at this time.  Patient's current weight is 257 lb 9.6 oz.  I have no prior recent weight to compare this to.  Provided some brief education on utilizing the ED if other symptoms arise/persist and purchasing compression stockings in a larger size to prevent further fluid buildup.  I just wanted to make you both aware in the event either office wants to reach out to the patient for follow-up.    Elease Etienne, PT, DPT

## 2022-03-05 NOTE — Therapy (Signed)
OUTPATIENT OCCUPATIONAL THERAPY NEURO DISCHARGE  Patient Name: Todd Elliott MRN: [id number] DOB:February 23, 1956, 66 y.o., male Today's Date: 03/05/2022  PCP: Irene Pap REFERRING PROVIDER: Frann Rider   OT End of Session - 03/05/22 1404     Visit Number 5    Number of Visits 5    Date for OT Re-Evaluation 03/15/22    Authorization Type [employer]    OT Start Time 9476    OT Stop Time 1430   d/c session   OT Time Calculation (min) 28 min    Activity Tolerance Patient tolerated treatment well    Behavior During Therapy WFL for tasks assessed/performed             Past Medical History:  Diagnosis Date   Adrenal hyperplasia ([place])    Anal fistula    CHRONIC   COPD (chronic obstructive pulmonary disease) ([place])    Dyslipidemia    ED (erectile dysfunction)    Hemorrhoid    Hypercholesterolemia    Hypertension    Hypothyroidism    Left sided numbness    Past Surgical History:  Procedure Laterality Date   ANAL FISTULOTOMY N/A 01/30/2014   Procedure: ANAL FISTULOTOMY WITH DRAIN PLACEMENT;  Surgeon: [name], MD;  Location: [facility];  Service: General;  Laterality: N/A;   CHOLECYSTECTOMY  2013   COLONOSCOPY     EVALUATION UNDER ANESTHESIA WITH ANAL FISTULECTOMY N/A 06/26/2014   Procedure: ANAL EXAM UNDER ANESTHESIA ;  Surgeon: [name], MD;  Location: [facility];  Service: General;  Laterality: N/A;   EVALUATION UNDER ANESTHESIA WITH ANAL FISTULECTOMY N/A 04/17/2015   Procedure: EXAM UNDER ANESTHESIA WITH ANAL FISTULOTOMY;  Surgeon: [name], MD;  Location: [facility];  Service: General;  Laterality: N/A;   EXAMINATION UNDER ANESTHESIA N/A 01/30/2014   Procedure: EXAM UNDER ANESTHESIA;  Surgeon: [name], MD;  Location: [facility];  Service: General;  Laterality: N/A;   INCISION AND DRAINAGE PERIRECTAL ABSCESS Left 12/27/2013   Procedure: IRRIGATION AND DEBRIDEMENT PERIRECTAL ABSCESS;   Surgeon: Edward Jolly, MD;  Location: [facility];  Service: General;  Laterality: Left;   INCISION AND DRAINAGE PERIRECTAL ABSCESS N/A 02/10/2014   Procedure: IRRIGATION AND DEBRIDEMENT PERIRECTAL ABSCESS;  Surgeon: [name], MD;  Location: [facility];  Service: General;  Laterality: N/A;   INCISION AND DRAINAGE PERIRECTAL ABSCESS  06/26/2014   Procedure: IRRIGATION AND DEBRIDEMENT PERIRECTAL ABSCESS;  Surgeon: [name], MD;  Location: [facility];  Service: General;;   LAPAROSOPCY CONVERTED TO OPEN ILEOCECOSTOMY FOR GANGRENOUS RUPTURED APPENDIX  08-11-2005   ORIF LEFT SCAPHOID FX MID SHAFT  12-27-2003   ORIF RADIUS FX RIGHT MID SHAFT/ CLOSED REDUCTION RADIUS AND ULNAR DISLOCATION WRIST/  I & D AND CLOSURE  11-21-2003   VENTRAL HERNIA REPAIR  2013   Patient Active Problem List   Diagnosis Date Noted   Gait abnormality 09/28/2021   Paresthesia 09/28/2021   Left leg swelling 09/28/2021   Stage 3 severe COPD by GOLD classification ([place]) 05/29/2021   Anal abscess 02/10/2014   Perirectal abscess 12/27/2013   Rectal pain 12/26/2013   Personal history of colonic polyps 12/26/2013   ABNORMAL FINDINGS GI TRACT 03/03/2009   ADRENAL MASS, BILATERAL 02/21/2009   FECAL INCONTINENCE 01/09/2009   CHANGE IN BOWELS 01/09/2009   ABDOMINAL PAIN-RLQ 01/09/2009    ONSET DATE: 02/2021 - Left sided numbness, 10/15/2021 - diagnosed with multiple strokes  REFERRING DIAG: R20.2ICD-10-CM Paresthesia of right upper and lower  extremity  Z86.73ICD-10-CM History of multiple strokes  THERAPY DIAG:  Other lack of coordination  Unsteadiness on feet  Muscle weakness (generalized)  Other abnormalities of gait and mobility  Rationale for Evaluation and Treatment Rehabilitation  SUBJECTIVE:   SUBJECTIVE STATEMENT: 03/05/22 "I was so tired and sore"  Pt accompanied by: significant other  PERTINENT HISTORY: HTN, COPD, Multiple strokes - chronic  PRECAUTIONS: Fall  PAIN:  Are you  having pain? No - pt reports uncomfortable  PATIENT GOALS Reduce numbness  OBJECTIVE:   HAND FUNCTION: Grip strength: Right: 77.8 lbs; Left: 85.7 lbs and Lateral pinch: Right: 30 lbs, Left: 31 lbs  COORDINATION: Finger Nose Finger test: Mild undershooting BUE 9 Hole Peg test: Right: 34.22 sec; Left: 31.28 sec   TODAY'S TREATMENT:  03/05/22  Issued red theraputty  Access Code: [email or web address] URL: [email or web address] Date: 03/05/2022 Prepared by: Waldo Laine  Exercises - Putty Squeezes  - 1 x daily - 7 x weekly - 3 sets - 10 reps - Rolling Putty on Table  - 1 x daily - 7 x weekly - 3 sets - 10 reps - Thumb Opposition with Putty  - 1 x daily - 7 x weekly - 3 sets - 10 reps - Seated Finger MP Flexion with Putty  - 1 x daily - 7 x weekly - 3 sets - 10 reps  Grooved Pegs with RUE for increased coordination. Pt placed pegs with one at a time and removed with one at a time. Pt completed with min difficulty and min drops.   OCCUPATIONAL THERAPY DISCHARGE SUMMARY  Visits from Start of Care: 5  Current functional level related to goals / functional outcomes: Increased coordination in RUE and with increased endurance. Pt and spouse verbalized understanding of ADL strategies for safety.   Remaining deficits: Continues with decreased endurance and unsteadiness on feet   Education / Equipment: HEPs for coordination and grip strength   Patient agrees to discharge. Patient goals were partially met. Patient is being discharged due to maximized rehab potential. .     HOME EXERCISE PROGRAM: 02/13/22 Coordination HEP BUE, issued information about tub bench 03/05/22 Access Code: [email or web address]   GOALS: Goals reviewed with patient? Yes    LONG TERM GOALS: Target date: 03/15/22  Patient will complete HEP designed to address BUE coordination and strength Baseline:  Goal status: MET  2.  Patient and wife will demonstrate understanding regarding tub/shower equipment/  recommendations Baseline:  Goal status: MET  3.  Patient will complete modified simple yard work x 5-10 minutes Baseline:  Goal status: NOT MET pt not doing yard work but taking longer walks in the yard and driveway  4.  Patient will demonstrate 2 sec reduction in time for right 9 hole peg test Baseline: Right: 34.22 sec; Left: 31.28 sec Goal status: MET 27s  5.  Patient will report reduced overall time to dress himself daily Baseline:  Goal status: NOT MET pt sitting down to get dressed now but not reduction in time.  ASSESSMENT:  CLINICAL IMPRESSION: 03/05/22 Pt met 3/5 goals and is discharging from OT at this time d/t maximizing rehab potential.  PERFORMANCE DEFICITS in functional skills including coordination, dexterity, sensation, edema, pain, flexibility, GMC, balance, endurance, decreased knowledge of precautions, decreased knowledge of use of DME, and UE functional use, cognitive skills including memory, and psychosocial skills including habits.   IMPAIRMENTS are limiting patient from ADLs and IADLs.   COMORBIDITIES may have co-morbidities  that affects occupational performance. Patient will benefit from skilled  OT to address above impairments and improve overall function.  MODIFICATION OR ASSISTANCE TO COMPLETE EVALUATION: Min-Moderate modification of tasks or assist with assess necessary to complete an evaluation.  OT OCCUPATIONAL PROFILE AND HISTORY: Detailed assessment: Review of records and additional review of physical, cognitive, psychosocial history related to current functional performance.  CLINICAL DECISION MAKING: Moderate - several treatment options, min-mod task modification necessary  REHAB POTENTIAL: Good  EVALUATION COMPLEXITY: Moderate    PLAN: OT FREQUENCY: 1x/week  OT DURATION: 4 weeks  PLANNED INTERVENTIONS: self care/ADL training, therapeutic exercise, therapeutic activity, neuromuscular re-education, balance training, functional mobility  training, aquatic therapy, ultrasound, patient/family education, and DME and/or AE instructions  RECOMMENDED OTHER SERVICES: NA  CONSULTED AND AGREED WITH PLAN OF CARE: Patient and family member/caregiver  PLAN FOR NEXT SESSION: 03/05/22 OT discharge   Zachery Conch, OT 03/05/2022, 2:42 PM

## 2022-03-06 LAB — HEPATIC FUNCTION PANEL
ALT: 20 IU/L (ref 0–44)
AST: 14 IU/L (ref 0–40)
Albumin: 4.1 g/dL (ref 3.9–4.9)
Alkaline Phosphatase: 81 IU/L (ref 44–121)
Bilirubin Total: 0.4 mg/dL (ref 0.0–1.2)
Bilirubin, Direct: 0.16 mg/dL (ref 0.00–0.40)
Total Protein: 6.3 g/dL (ref 6.0–8.5)

## 2022-03-06 LAB — LIPID PANEL
Chol/HDL Ratio: 3.2 ratio (ref 0.0–5.0)
Cholesterol, Total: 94 mg/dL — ABNORMAL LOW (ref 100–199)
HDL: 29 mg/dL — ABNORMAL LOW (ref >39)
LDL Chol Calc (NIH): 45 mg/dL (ref 0–99)
Triglycerides: 105 mg/dL (ref 0–149)
VLDL Cholesterol Cal: 20 mg/dL (ref 5–40)

## 2022-03-06 LAB — LIPOPROTEIN A (LPA): Lipoprotein (a): <8.4 nmol/L (ref <75.0)

## 2022-03-08 NOTE — Telephone Encounter (Signed)
From: Early Osmond, MD  Sent: 03/06/2022   6:33 AM EDT  To: Frann Rider, NP; Bary Richard, PT; *    Let's have him take his lasix TID x 3 days then back to BID.  Thanks.     Left messages to call back on home and mobile numbers.

## 2022-03-08 NOTE — Telephone Encounter (Signed)
Reviewed with the patient and his wife that Dr. Ali Lowe recommends increasing Lasix 20 mg to TID X3 days and then return to current dose of 20 mg BID.  He reports to me this morning that his ankles are three times as big as normal and look like sausages.  He does not weigh consistently.  I asked him to consume a low sodium diet and elevate legs when sitting.  He is seeing physical therapy again on Friday.

## 2022-03-12 ENCOUNTER — Ambulatory Visit: Payer: Medicare HMO | Admitting: Physical Therapy

## 2022-03-12 ENCOUNTER — Encounter: Payer: Self-pay | Admitting: Physical Therapy

## 2022-03-12 VITALS — BP 138/86 | HR 83 | Wt 254.2 lb

## 2022-03-12 DIAGNOSIS — R2689 Other abnormalities of gait and mobility: Secondary | ICD-10-CM

## 2022-03-12 DIAGNOSIS — R2681 Unsteadiness on feet: Secondary | ICD-10-CM

## 2022-03-12 DIAGNOSIS — R278 Other lack of coordination: Secondary | ICD-10-CM

## 2022-03-12 DIAGNOSIS — R208 Other disturbances of skin sensation: Secondary | ICD-10-CM | POA: Diagnosis not present

## 2022-03-12 DIAGNOSIS — M6281 Muscle weakness (generalized): Secondary | ICD-10-CM

## 2022-03-12 NOTE — Therapy (Signed)
OUTPATIENT PHYSICAL THERAPY NEURO TREATMENT NOTE   Patient Name: Todd Elliott MRN: [id number] DOB:08-Jan-1956, 66 y.o., male Today's Date: 03/12/2022   PCP: [name], DO REFERRING PROVIDER: Frann Rider, NP    PT End of Session - 03/12/22 1410     Visit Number 6    Number of Visits 13   12+eval   Date for PT Re-Evaluation 05/14/22   Pushed out due to scheduling.   Authorization Type [employer] MEDICARE    PT Start Time 1401    PT Stop Time 1450    PT Time Calculation (min) 49 min    Equipment Utilized During Treatment Gait belt    Activity Tolerance Treatment limited secondary to medical complications (Comment)   concern for SOB in combination with unknown weight gain over last 3 days with inc in LE pitting edema.   Behavior During Therapy WFL for tasks assessed/performed             Past Medical History:  Diagnosis Date   Adrenal hyperplasia ([place])    Anal fistula    CHRONIC   COPD (chronic obstructive pulmonary disease) ([place])    Dyslipidemia    ED (erectile dysfunction)    Hemorrhoid    Hypercholesterolemia    Hypertension    Hypothyroidism    Left sided numbness    Past Surgical History:  Procedure Laterality Date   ANAL FISTULOTOMY N/A 01/30/2014   Procedure: ANAL FISTULOTOMY WITH DRAIN PLACEMENT;  Surgeon: [name], MD;  Location: [place];  Service: General;  Laterality: N/A;   CHOLECYSTECTOMY  2013   COLONOSCOPY     EVALUATION UNDER ANESTHESIA WITH ANAL FISTULECTOMY N/A 06/26/2014   Procedure: ANAL EXAM UNDER ANESTHESIA ;  Surgeon: [name], MD;  Location: [place];  Service: General;  Laterality: N/A;   EVALUATION UNDER ANESTHESIA WITH ANAL FISTULECTOMY N/A 04/17/2015   Procedure: EXAM UNDER ANESTHESIA WITH ANAL FISTULOTOMY;  Surgeon: [name], MD;  Location: [place];  Service: General;  Laterality: N/A;   EXAMINATION UNDER ANESTHESIA N/A 01/30/2014   Procedure: EXAM UNDER  ANESTHESIA;  Surgeon: [name], MD;  Location: [place];  Service: General;  Laterality: N/A;   INCISION AND DRAINAGE PERIRECTAL ABSCESS Left 12/27/2013   Procedure: IRRIGATION AND DEBRIDEMENT PERIRECTAL ABSCESS;  Surgeon: Edward Jolly, MD;  Location[facility];  Service: General;  Laterality: Left;   INCISION AND DRAINAGE PERIRECTAL ABSCESS N/A 02/10/2014   Procedure: IRRIGATION AND DEBRIDEMENT PERIRECTAL ABSCESS;  Surgeon: [name], MD;  Location: [place];  Service: General;  Laterality: N/A;   INCISION AND DRAINAGE PERIRECTAL ABSCESS  06/26/2014   Procedure: IRRIGATION AND DEBRIDEMENT PERIRECTAL ABSCESS;  Surgeon: [name], MD;  Location: [place];  Service: General;;   LAPAROSOPCY CONVERTED TO OPEN ILEOCECOSTOMY FOR GANGRENOUS RUPTURED APPENDIX  08-11-2005   ORIF LEFT SCAPHOID FX MID SHAFT  12-27-2003   ORIF RADIUS FX RIGHT MID SHAFT/ CLOSED REDUCTION RADIUS AND ULNAR DISLOCATION WRIST/  I & D AND CLOSURE  11-21-2003   VENTRAL HERNIA REPAIR  2013   Patient Active Problem List   Diagnosis Date Noted   Gait abnormality 09/28/2021   Paresthesia 09/28/2021   Left leg swelling 09/28/2021   Stage 3 severe COPD by GOLD classification ([place]) 05/29/2021   Anal abscess 02/10/2014   Perirectal abscess 12/27/2013   Rectal pain 12/26/2013   Personal history of colonic polyps 12/26/2013   ABNORMAL FINDINGS GI TRACT 03/03/2009   ADRENAL MASS,  BILATERAL 02/21/2009   FECAL INCONTINENCE 01/09/2009   CHANGE IN BOWELS 01/09/2009   ABDOMINAL PAIN-RLQ 01/09/2009    ONSET DATE: 01/13/2022   REFERRING DIAG: R20.2 (ICD-10-CM) - Paresthesia of right upper and lower extremity Z86.73 (ICD-10-CM) - History of multiple strokes   THERAPY DIAG:  Other lack of coordination  Unsteadiness on feet  Muscle weakness (generalized)  Other abnormalities of gait and mobility  Rationale for Evaluation and Treatment Rehabilitation  SUBJECTIVE:                                                                                                                                                                                               SUBJECTIVE STATEMENT: Pt denies falls.  They obtained new compression stockings, but he has not worn them yet.  They took 3 lasix pills M/Tu/W then returned to 2 a day recommended by MD from last visit. Pt accompanied by: significant other - [name] (wife)  PERTINENT HISTORY: multiple CVA affecting bilaterally-most recent occurred 10/2021, adrenal hyperplasia, COPD stage 3, hypercholesterolemia, HTN  PAIN: Are you having pain? No  PRECAUTIONS: Fall  WEIGHT BEARING RESTRICTIONS No  OBJECTIVE:   TODAY'S TREATMENT:  Today's Vitals   03/12/22 1404  BP: 138/86  Pulse: 83  SpO2: 95%  Weight: 254 lb 3.2 oz (115.3 kg)   Verbally reviewed HEP, reviewed seated heel raises x10.  Pt has not been doing backwards walking.  Assessed 5xSTS w/ BUE support:  11.82 sec Assessed 10MWT w/ SPC:  12.15 sec = 0.82 m/sec OR 2.72 ft/sec Assessed 6MWT w/ SPC:  206' + 92' = 298' Assessed FGA:  [facility] PT Assessment - 03/12/22 1443       Functional Gait  Assessment   Gait assessed  Yes    Gait Level Surface Walks 20 ft in less than 7 sec but greater than 5.5 sec, uses assistive device, slower speed, mild gait deviations, or deviates 6-10 in outside of the 12 in walkway width.    Change in Gait Speed Makes only minor adjustments to walking speed, or accomplishes a change in speed with significant gait deviations, deviates 10-15 in outside the 12 in walkway width, or changes speed but loses balance but is able to recover and continue walking.    Gait with Horizontal Head Turns Performs head turns smoothly with slight change in gait velocity (eg, minor disruption to smooth gait path), deviates 6-10 in outside 12 in walkway width, or uses an assistive device.    Gait with Vertical Head Turns Performs task with slight change in gait velocity (eg,  minor disruption to smooth gait path), deviates 6 -  10 in outside 12 in walkway width or uses assistive device    Gait and Pivot Turn Pivot turns safely in greater than 3 sec and stops with no loss of balance, or pivot turns safely within 3 sec and stops with mild imbalance, requires small steps to catch balance.    Step Over Obstacle Cannot perform without assistance.    Gait with Narrow Base of Support Ambulates less than 4 steps heel to toe or cannot perform without assistance.    Gait with Eyes Closed Walks 20 ft, uses assistive device, slower speed, mild gait deviations, deviates 6-10 in outside 12 in walkway width. Ambulates 20 ft in less than 9 sec but greater than 7 sec.    Ambulating Backwards Walks 20 ft, uses assistive device, slower speed, mild gait deviations, deviates 6-10 in outside 12 in walkway width.    Steps Alternating feet, must use rail.    Total Score 15    FGA comment: High fall risk            PATIENT EDUCATION: Education details: Elevated feet, AROM, and wear compression socks during awake hours.  Discussed positioning and pursed lip breathing for promotion of comfort, relaxation and sleep.  Continue HEP and walking in home w/ supervision.   Person educated: Patient and Spouse Education method: Explanation Education comprehension: verbalized understanding and needs further education   HOME EXERCISE PROGRAM: Access Code: [facility] URL: [email or web address] Date: 02/12/2022 Prepared by: Elease Etienne  Exercises - Sit to Stand  - 1 x daily - 5 x weekly - 2-3 sets - 8 reps - Seated Hamstring Curls with Resistance  - 1 x daily - 5 x weekly - 2 sets - 20 reps- green theraband - Standing Hip Abduction with Counter Support  - 1 x daily - 5 x weekly - 2 sets - 10 reps - Backward Walking with Counter Support  - 1 x daily - 5 x weekly - 3 sets - 10 reps - Seated Heel Raise  - 1 x daily - 5 x weekly - 2 sets - 20 reps Walking program:  2 minutes 3-4x  per day 3-4 days per week with supervision from wife.  GOALS: Goals reviewed with patient? Yes  SHORT TERM GOALS: Target date: 03/05/2022  Pt will be independent in performance of initial strength and balance HEP with supervision from family. Baseline:  Pt states compliance, wife states intermittent compliance to standing exercise and walking. Goal status: MET  2.  Pt will decrease 5xSTS to </= 16 seconds in order to demonstrate decreased risk for falls and improved functional bilateral LE strength and power. Baseline: 20.07 seconds w/ BUE support; 11.82 sec w/ BUE support Goal status: MET  3.  Pt will demonstrate a gait speed of >/=2.5 feet/sec w/ LRAD in order to decrease risk for falls. Baseline: 2.16 ft/sec w/ SPC; 03/12/2022 2.72 ft/sec w/ SPC Goal status: MET  4.  Pt will ambulate >/=400' feet on 6MWT to demonstrate improved functional endurance for home and community participation. Baseline: 383'; 298' Goal status: NOT MET  5. Pt will improve FGA score to >/=18/30 in order to demonstrate improved balance and decreased fall risk. Baseline: 14/30; 03/12/2022 15/30 Goal status: NOT MET  LONG TERM GOALS: Target date: 04/02/2022 (8 weeks) 04/30/2022 (12 weeks)  Pt will be independent with finalized strength and balance HEP with supervision from family. Baseline: To be established. Goal status: INITIAL  2.  Pt will decrease 5xSTS to </=13 seconds w/o UE support in order  to demonstrate decreased risk for falls and improved functional bilateral LE strength and power. Baseline: 20.07 seconds w/ BUE support, 03/12/2022 11.82 sec w/ BUE support Goal status: REVISED  3.  Pt will demonstrate a gait speed of >/=3.0 feet/sec w/ LRAD in order to decrease risk for falls. Baseline: 2.16 ft/sec w/ SPC Goal status: INITIAL  4.  Pt will ambulate >/=500 feet on 6MWT to demonstrate improved functional endurance for home and community participation. Baseline: 383' Goal status: INITIAL  5.  Pt  will improve FGA score to >/=18/30 in order to demonstrate improved balance and decreased fall risk. Baseline: 14/30; 03/12/2022 15/30 Goal status: REVISED  ASSESSMENT:  CLINICAL IMPRESSION: Assessment of short term goals completed this session with patient meeting 3 of 5 goals.  He is intermittently working on all of HEP except backwards walking due to wife's work schedule.  He has progressed his 5xSTS to 11.82 seconds with BUE support and his gait speed has improved beyond goal level to 2.72 ft/sec w/ use of his SPC.  He tolerated less ambulatory distance during 6MWT this session of 298' compared to 383', but appears to still be dealing with some mobility limitations possibly related to fluctuation in BLE swelling following recent return to normal lasix medication schedule.  He scored a 15/30 on the FGA this session which is modest improvement overall with patient remaining severely challenged by hurdle, narrowed BOS, and ambulation with eyes closed.  Will continue to progress toward LTGs as able as pt is making some progress.  OBJECTIVE IMPAIRMENTS Abnormal gait, decreased activity tolerance, decreased balance, decreased coordination, decreased endurance, decreased knowledge of condition, decreased knowledge of use of DME, decreased mobility, difficulty walking, decreased ROM, decreased strength, increased edema, and impaired sensation.   ACTIVITY LIMITATIONS carrying, standing, bathing, and locomotion level  PARTICIPATION LIMITATIONS: community activity  PERSONAL FACTORS Age, Education, Fitness, Past/current experiences, Time since onset of injury/illness/exacerbation, and 3+ comorbidities: multiple CVA affecting bilaterally, COPD stage 3, and HTN  are also affecting patient's functional outcome.   REHAB POTENTIAL: Fair See personal factors and PMH  CLINICAL DECISION MAKING: Evolving/moderate complexity  EVALUATION COMPLEXITY: Moderate  PLAN: PT FREQUENCY: 1x/week  PT DURATION: 12  weeks  PLANNED INTERVENTIONS: Therapeutic exercises, Therapeutic activity, Neuromuscular re-education, Balance training, Gait training, Patient/Family education, Joint mobilization, Stair training, Vestibular training, DME instructions, Manual therapy, and Re-evaluation  PLAN FOR NEXT SESSION:  Update HEP for LE strength and balance prn, stair training, gait training w/ and w/o AD, SciFit for endurance, monitor spO2, hurdles, step ups, ladder drills for step length   Bary Richard, PT, DPT 03/12/2022, 4:47 PM

## 2022-03-19 ENCOUNTER — Encounter: Payer: Self-pay | Admitting: Physical Therapy

## 2022-03-19 ENCOUNTER — Ambulatory Visit: Payer: Medicare HMO | Attending: Adult Health | Admitting: Physical Therapy

## 2022-03-19 VITALS — BP 121/76 | HR 83

## 2022-03-19 DIAGNOSIS — M6281 Muscle weakness (generalized): Secondary | ICD-10-CM | POA: Diagnosis present

## 2022-03-19 DIAGNOSIS — R2681 Unsteadiness on feet: Secondary | ICD-10-CM

## 2022-03-19 DIAGNOSIS — R2689 Other abnormalities of gait and mobility: Secondary | ICD-10-CM | POA: Diagnosis present

## 2022-03-19 DIAGNOSIS — R278 Other lack of coordination: Secondary | ICD-10-CM | POA: Diagnosis present

## 2022-03-19 DIAGNOSIS — R202 Paresthesia of skin: Secondary | ICD-10-CM

## 2022-03-19 NOTE — Therapy (Signed)
OUTPATIENT PHYSICAL THERAPY NEURO TREATMENT NOTE   Patient Name: Todd Elliott MRN: [id number] DOB:Sep 20, 1955, 66 y.o., male Today's Date: 03/19/2022   PCP: [name], DO REFERRING PROVIDER: Frann Rider, NP    PT End of Session - 03/19/22 1348     Visit Number 7    Number of Visits 13   12+eval   Date for PT Re-Evaluation 05/14/22   Pushed out due to scheduling.   Authorization Type [employer] MEDICARE    PT Start Time 9629   pt agreeable to taking back early   PT Stop Time 1438    PT Time Calculation (min) 49 min    Equipment Utilized During Treatment Gait belt    Activity Tolerance Treatment limited secondary to medical complications (Comment)    Behavior During Therapy WFL for tasks assessed/performed              Past Medical History:  Diagnosis Date   Adrenal hyperplasia ([place])    Anal fistula    CHRONIC   COPD (chronic obstructive pulmonary disease) ([place])    Dyslipidemia    ED (erectile dysfunction)    Hemorrhoid    Hypercholesterolemia    Hypertension    Hypothyroidism    Left sided numbness    Past Surgical History:  Procedure Laterality Date   ANAL FISTULOTOMY N/A 01/30/2014   Procedure: ANAL FISTULOTOMY WITH DRAIN PLACEMENT;  Surgeon: [name], MD;  Location: [place];  Service: General;  Laterality: N/A;   CHOLECYSTECTOMY  2013   COLONOSCOPY     EVALUATION UNDER ANESTHESIA WITH ANAL FISTULECTOMY N/A 06/26/2014   Procedure: ANAL EXAM UNDER ANESTHESIA ;  Surgeon: [name], MD;  Location: [place];  Service: General;  Laterality: N/A;   EVALUATION UNDER ANESTHESIA WITH ANAL FISTULECTOMY N/A 04/17/2015   Procedure: EXAM UNDER ANESTHESIA WITH ANAL FISTULOTOMY;  Surgeon: [name], MD;  Location: [place];  Service: General;  Laterality: N/A;   EXAMINATION UNDER ANESTHESIA N/A 01/30/2014   Procedure: EXAM UNDER ANESTHESIA;  Surgeon: [name], MD;  Location: [place];  Service: General;  Laterality: N/A;   INCISION AND DRAINAGE PERIRECTAL ABSCESS Left 12/27/2013   Procedure: IRRIGATION AND DEBRIDEMENT PERIRECTAL ABSCESS;  Surgeon: Edward Jolly, MD;  [facility];  Service: General;  Laterality: Left;   INCISION AND DRAINAGE PERIRECTAL ABSCESS N/A 02/10/2014   Procedure: IRRIGATION AND DEBRIDEMENT PERIRECTAL ABSCESS;  Surgeon: [name], MD;  Location: [place];  Service: General;  Laterality: N/A;   INCISION AND DRAINAGE PERIRECTAL ABSCESS  06/26/2014   Procedure: IRRIGATION AND DEBRIDEMENT PERIRECTAL ABSCESS;  Surgeon: [name], MD;  Location: [place];  Service: General;;   LAPAROSOPCY CONVERTED TO [place]  08-11-2005   ORIF LEFT SCAPHOID FX MID SHAFT  12-27-2003   ORIF RADIUS FX RIGHT MID SHAFT/ CLOSED REDUCTION RADIUS AND ULNAR DISLOCATION WRIST/  I & D AND CLOSURE  11-21-2003   VENTRAL HERNIA REPAIR  2013   Patient Active Problem List   Diagnosis Date Noted   Gait abnormality 09/28/2021   Paresthesia 09/28/2021   Left leg swelling 09/28/2021   Stage 3 severe COPD by GOLD classification ([place]) 05/29/2021   Anal abscess 02/10/2014   Perirectal abscess 12/27/2013   Rectal pain 12/26/2013   Personal history of colonic polyps 12/26/2013   ABNORMAL FINDINGS GI TRACT 03/03/2009   ADRENAL MASS, BILATERAL 02/21/2009   FECAL INCONTINENCE 01/09/2009   CHANGE IN BOWELS  01/09/2009   ABDOMINAL PAIN-RLQ 01/09/2009    ONSET DATE: 01/13/2022   REFERRING DIAG: R20.2 (ICD-10-CM) - Paresthesia of right upper and lower extremity Z86.73 (ICD-10-CM) - History of multiple strokes   THERAPY DIAG:  Other lack of coordination  Unsteadiness on feet  Muscle weakness (generalized)  Other abnormalities of gait and mobility  Paresthesia of skin  Rationale for Evaluation and Treatment Rehabilitation  SUBJECTIVE:                                                                                                                                                                                               SUBJECTIVE STATEMENT: Pt denies falls or acute worsening of BLE edema.  He did have a singular near miss where he tripped over his daughter's dog.  He caught himself against a cabinet and almost hit both knees, he did lower himself to a knee and he pulled himself up by himself.  They are inconsistently putting on his compression stockings.  Pt is able to do some part of HEP every day, but cannot do all exercises all at once. Pt accompanied by: significant other - [name] (wife)  PERTINENT HISTORY: multiple CVA affecting bilaterally-most recent occurred 10/2021, adrenal hyperplasia, COPD stage 3, hypercholesterolemia, HTN  PAIN: Are you having pain? No  PRECAUTIONS: Fall  WEIGHT BEARING RESTRICTIONS No  OBJECTIVE:   TODAY'S TREATMENT:  Today's Vitals   03/19/22 1350 03/19/22 1437  BP: (!) 130/90 121/76  Pulse: 81 83  SpO2: 94%    GAIT: Gait pattern: step through pattern, decreased arm swing- Left, decreased step length- Right, decreased step length- Left, decreased stride length, decreased hip/knee flexion- Right, decreased hip/knee flexion- Left, trunk flexed, wide BOS, poor foot clearance- Right, and poor foot clearance- Left Distance walked: 230' + 230' Assistive device utilized: Single point cane Level of assistance: Modified independence and SBA Comments: Initial bout of gait w/ SPC, cued for step size.  Pt demonstrates decreased step size w/o SPC on second trial of gait requiring cuing for improved mechanics.  Edu on improved step size improving efficiency of gait w/ pt expressing he cannot walk with bigger steps.  SpO2 95% following.  Pt requires prolonged seated rest between and following trials.  4" hurdles w/ unilateral countertop support fwd/laterally 6x8' each; SpO2 95% following, pulse 91bpm - performed for hip engagement to task and  strengthening  Ladder fwds 4x10' w/ [facility] for gait mechanics and improved step length reciprocally > 2x10' w/o SPC and CGA w/ pt reporting inc fear of falling w/ steady appearance of gait and weight shift  Reassessed BP at end of session.  See above.  SpO2 95%.  PATIENT EDUCATION: Education details: Discussed elevation of LE vs dependent positioning and wear schedule with compression stockings to build tolerance around wife's work schedule (2 hrs > 4 hrs > etc.) due to pt's inability to don and doff stockings d/t body habitus and difficulty breathing during task.  Continue HEP. Person educated: Patient and Spouse Education method: Explanation Education comprehension: verbalized understanding and needs further education   HOME EXERCISE PROGRAM: Access Code: [facility] URL: [email or web address] Date: 02/12/2022 Prepared by: Elease Etienne  Exercises - Sit to Stand  - 1 x daily - 5 x weekly - 2-3 sets - 8 reps - Seated Hamstring Curls with Resistance  - 1 x daily - 5 x weekly - 2 sets - 20 reps- green theraband - Standing Hip Abduction with Counter Support  - 1 x daily - 5 x weekly - 2 sets - 10 reps - Backward Walking with Counter Support  - 1 x daily - 5 x weekly - 3 sets - 10 reps - Seated Heel Raise  - 1 x daily - 5 x weekly - 2 sets - 20 reps Walking program:  2 minutes 3-4x per day 3-4 days per week with supervision from wife.  GOALS: Goals reviewed with patient? Yes  SHORT TERM GOALS: Target date: 03/05/2022  Pt will be independent in performance of initial strength and balance HEP with supervision from family. Baseline:  Pt states compliance, wife states intermittent compliance to standing exercise and walking. Goal status: MET  2.  Pt will decrease 5xSTS to </= 16 seconds in order to demonstrate decreased risk for falls and improved functional bilateral LE strength and power. Baseline: 20.07 seconds w/ BUE support; 11.82 sec w/ BUE support Goal status:  MET  3.  Pt will demonstrate a gait speed of >/=2.5 feet/sec w/ LRAD in order to decrease risk for falls. Baseline: 2.16 ft/sec w/ SPC; 03/12/2022 2.72 ft/sec w/ SPC Goal status: MET  4.  Pt will ambulate >/=400' feet on 6MWT to demonstrate improved functional endurance for home and community participation. Baseline: 383'; 298' Goal status: NOT MET  5. Pt will improve FGA score to >/=18/30 in order to demonstrate improved balance and decreased fall risk. Baseline: 14/30; 03/12/2022 15/30 Goal status: NOT MET  LONG TERM GOALS: Target date: 04/02/2022 (8 weeks) 04/30/2022 (12 weeks)  Pt will be independent with finalized strength and balance HEP with supervision from family. Baseline: To be established. Goal status: INITIAL  2.  Pt will decrease 5xSTS to </=13 seconds w/o UE support in order to demonstrate decreased risk for falls and improved functional bilateral LE strength and power. Baseline: 20.07 seconds w/ BUE support, 03/12/2022 11.82 sec w/ BUE support Goal status: REVISED  3.  Pt will demonstrate a gait speed of >/=3.0 feet/sec w/ LRAD in order to decrease risk for falls. Baseline: 2.16 ft/sec w/ SPC Goal status: INITIAL  4.  Pt will ambulate >/=500 feet on 6MWT to demonstrate improved functional endurance for home and community participation. Baseline: 383' Goal status: INITIAL  5.  Pt will improve FGA score to >/=18/30 in order to demonstrate improved balance and decreased fall risk. Baseline: 14/30; 03/12/2022 15/30 Goal status: REVISED  ASSESSMENT:  CLINICAL IMPRESSION: Focus of skilled session on gait training and mechanics to promote improved efficiency of mobility.  Pt demonstrates increased fear of falling during ladder drills to improve step length, but maintains balance well.  He will continue to benefit from skilled PT interventions outlined in POC.  OBJECTIVE IMPAIRMENTS Abnormal gait, decreased activity tolerance, decreased balance, decreased coordination,  decreased endurance, decreased knowledge of condition, decreased knowledge of use of DME, decreased mobility, difficulty walking, decreased ROM, decreased strength, increased edema, and impaired sensation.   ACTIVITY LIMITATIONS carrying, standing, bathing, and locomotion level  PARTICIPATION LIMITATIONS: community activity  PERSONAL FACTORS Age, Education, Fitness, Past/current experiences, Time since onset of injury/illness/exacerbation, and 3+ comorbidities: multiple CVA affecting bilaterally, COPD stage 3, and HTN  are also affecting patient's functional outcome.   REHAB POTENTIAL: Fair See personal factors and PMH  CLINICAL DECISION MAKING: Evolving/moderate complexity  EVALUATION COMPLEXITY: Moderate  PLAN: PT FREQUENCY: 1x/week  PT DURATION: 12 weeks  PLANNED INTERVENTIONS: Therapeutic exercises, Therapeutic activity, Neuromuscular re-education, Balance training, Gait training, Patient/Family education, Joint mobilization, Stair training, Vestibular training, DME instructions, Manual therapy, and Re-evaluation  PLAN FOR NEXT SESSION:  Update HEP for LE strength and balance prn, stair training, gait training w/ and w/o AD, SciFit for endurance, monitor spO2, hip flexion activities, step ups, continue ladder drills for step length, postural exercises in sitting    Bary Richard, PT, DPT 03/19/2022, 2:48 PM

## 2022-03-26 ENCOUNTER — Ambulatory Visit: Payer: Medicare HMO | Admitting: Physical Therapy

## 2022-03-26 ENCOUNTER — Encounter: Payer: Self-pay | Admitting: Physical Therapy

## 2022-03-26 VITALS — HR 82

## 2022-03-26 DIAGNOSIS — R2681 Unsteadiness on feet: Secondary | ICD-10-CM

## 2022-03-26 DIAGNOSIS — R2689 Other abnormalities of gait and mobility: Secondary | ICD-10-CM

## 2022-03-26 DIAGNOSIS — R278 Other lack of coordination: Secondary | ICD-10-CM

## 2022-03-26 DIAGNOSIS — M6281 Muscle weakness (generalized): Secondary | ICD-10-CM

## 2022-03-26 NOTE — Therapy (Signed)
OUTPATIENT PHYSICAL THERAPY NEURO TREATMENT NOTE   Patient Name: Todd Elliott MRN: [id number] DOB:1956-08-06, 66 y.o., male Today's Date: 03/26/2022   PCP: Todd Belt, DO REFERRING PROVIDER: Frann Rider, NP    PT End of Session - 03/26/22 1406     Visit Number 8    Number of Visits 13   12+eval   Date for PT Re-Evaluation 05/14/22   Pushed out due to scheduling.   Authorization Type [employer] MEDICARE    PT Start Time 1401    PT Stop Time 1447    PT Time Calculation (min) 46 min    Activity Tolerance Treatment limited secondary to medical complications (Comment)    Behavior During Therapy WFL for tasks assessed/performed              Past Medical History:  Diagnosis Date   Adrenal hyperplasia ([place])    Anal fistula    CHRONIC   COPD (chronic obstructive pulmonary disease) ([place])    Dyslipidemia    ED (erectile dysfunction)    Hemorrhoid    Hypercholesterolemia    Hypertension    Hypothyroidism    Left sided numbness    Past Surgical History:  Procedure Laterality Date   ANAL FISTULOTOMY N/A 01/30/2014   Procedure: ANAL FISTULOTOMY WITH DRAIN PLACEMENT;  Surgeon: Todd Ruff, MD;  Location: [facility];  Service: General;  Laterality: N/A;   CHOLECYSTECTOMY  2013   COLONOSCOPY     EVALUATION UNDER ANESTHESIA WITH ANAL FISTULECTOMY N/A 06/26/2014   Procedure: ANAL EXAM UNDER ANESTHESIA ;  Surgeon: Todd Ruff, MD;  Location: [place];  Service: General;  Laterality: N/A;   EVALUATION UNDER ANESTHESIA WITH ANAL FISTULECTOMY N/A 04/17/2015   Procedure: EXAM UNDER ANESTHESIA WITH ANAL FISTULOTOMY;  Surgeon: Todd Ruff, MD;  Location: [facility];  Service: General;  Laterality: N/A;   EXAMINATION UNDER ANESTHESIA N/A 01/30/2014   Procedure: EXAM UNDER ANESTHESIA;  Surgeon: Todd Ruff, MD;  Location: [place];  Service: General;  Laterality: N/A;   INCISION AND DRAINAGE PERIRECTAL ABSCESS  Left 12/27/2013   Procedure: IRRIGATION AND DEBRIDEMENT PERIRECTAL ABSCESS;  Surgeon: Todd Jolly, MD;  Location: [facility];  Service: General;  Laterality: Left;   INCISION AND DRAINAGE PERIRECTAL ABSCESS N/A 02/10/2014   Procedure: IRRIGATION AND DEBRIDEMENT PERIRECTAL ABSCESS;  Surgeon: Todd Ruff, MD;  Location: [facility];  Service: General;  Laterality: N/A;   INCISION AND DRAINAGE PERIRECTAL ABSCESS  06/26/2014   Procedure: IRRIGATION AND DEBRIDEMENT PERIRECTAL ABSCESS;  Surgeon: Todd Ruff, MD;  Location: [facility];  Service: General;;   LAPAROSOPCY CONVERTED TO [place]  08-11-2005   ORIF LEFT SCAPHOID FX MID SHAFT  12-27-2003   ORIF RADIUS FX RIGHT MID SHAFT/ CLOSED REDUCTION RADIUS AND ULNAR DISLOCATION WRIST/  I & D AND CLOSURE  11-21-2003   VENTRAL HERNIA REPAIR  2013   Patient Active Problem List   Diagnosis Date Noted   Gait abnormality 09/28/2021   Paresthesia 09/28/2021   Left leg swelling 09/28/2021   Stage 3 severe COPD by GOLD classification ([place]) 05/29/2021   Anal abscess 02/10/2014   Perirectal abscess 12/27/2013   Rectal pain 12/26/2013   Personal history of colonic polyps 12/26/2013   ABNORMAL FINDINGS GI TRACT 03/03/2009   ADRENAL MASS, BILATERAL 02/21/2009   FECAL INCONTINENCE 01/09/2009   CHANGE IN BOWELS 01/09/2009   ABDOMINAL PAIN-RLQ 01/09/2009    ONSET DATE: 01/13/2022   REFERRING DIAG:  R20.2 (ICD-10-CM) - Paresthesia of right upper and lower extremity Z86.73 (ICD-10-CM) - History of multiple strokes   THERAPY DIAG:  Other lack of coordination  Unsteadiness on feet  Muscle weakness (generalized)  Other abnormalities of gait and mobility  Rationale for Evaluation and Treatment Rehabilitation  SUBJECTIVE:                                                                                                                                                                                               SUBJECTIVE STATEMENT: Pt denies falls and near misses.  States he is sore from doing more activity between last visit and now including HEP.  Wife endorses she has been following up with pt about doing HEP. Pt accompanied by: significant other - Todd Elliott (wife)  PERTINENT HISTORY: multiple CVA affecting bilaterally-most recent occurred 10/2021, adrenal hyperplasia, COPD stage 3, hypercholesterolemia, HTN  PAIN: Are you having pain? No-still some hand numbness  PRECAUTIONS: Fall  WEIGHT BEARING RESTRICTIONS No  OBJECTIVE:   TODAY'S TREATMENT:  Today's Vitals   03/26/22 1406  Pulse: 82  SpO2: 96%   -Standing 3-way hip x10 all directions each LE -Step-up hip drives x12 alt LE 4" step CGA w/ BUE support Assessed O2 following 96% and HR 87bpm -Tandem walking 10x8' progressed from BUE support to 2 finger support -anteriorly oriented tilt board holding level x1 minute w/ pt demonstrated BLE shaking/fatigue > prolonged seated rest -Pt on SciFit in [place] mode x43mins using BUE/BLE  on L6 for dynamic cooldown, reciprocal mobility, and aerobic endurance.   SpO2 96%, HR 92bpm.  PATIENT EDUCATION: Education details: Continue HEP.  Continue monitoring BLE edema fluctuation.  Practice deep breathing techniques reviewed in prior sessions. Person educated: Patient and Spouse Education method: Explanation Education comprehension: verbalized understanding and needs further education   HOME EXERCISE PROGRAM: Access Code: [facility] URL: [email or web address] Date: 02/12/2022 Prepared by: Todd Elliott  Exercises - Sit to Stand  - 1 x daily - 5 x weekly - 2-3 sets - 8 reps - Seated Hamstring Curls with Resistance  - 1 x daily - 5 x weekly - 2 sets - 20 reps- green theraband - Standing Hip Abduction with Counter Support  - 1 x daily - 5 x weekly - 2 sets - 10 reps - Backward Walking with Counter Support  - 1 x daily - 5 x weekly - 3 sets - 10 reps - Seated Heel Raise   - 1 x daily - 5 x weekly - 2 sets - 20 reps Walking program:  2 minutes 3-4x per day 3-4 days per week with supervision from wife.  GOALS: Goals reviewed with patient? Yes  SHORT TERM GOALS: Target date: 03/05/2022  Pt will be independent in performance of initial strength and balance HEP with supervision from family. Baseline:  Pt states compliance, wife states intermittent compliance to standing exercise and walking. Goal status: MET  2.  Pt will decrease 5xSTS to </= 16 seconds in order to demonstrate decreased risk for falls and improved functional bilateral LE strength and power. Baseline: 20.07 seconds w/ BUE support; 11.82 sec w/ BUE support Goal status: MET  3.  Pt will demonstrate a gait speed of >/=2.5 feet/sec w/ LRAD in order to decrease risk for falls. Baseline: 2.16 ft/sec w/ SPC; 03/12/2022 2.72 ft/sec w/ SPC Goal status: MET  4.  Pt will ambulate >/=400' feet on 6MWT to demonstrate improved functional endurance for home and community participation. Baseline: 383'; 298' Goal status: NOT MET  5. Pt will improve FGA score to >/=18/30 in order to demonstrate improved balance and decreased fall risk. Baseline: 14/30; 03/12/2022 15/30 Goal status: NOT MET  LONG TERM GOALS: Target date: 04/02/2022 (8 weeks) 04/30/2022 (12 weeks)  Pt will be independent with finalized strength and balance HEP with supervision from family. Baseline: To be established. Goal status: INITIAL  2.  Pt will decrease 5xSTS to </=13 seconds w/o UE support in order to demonstrate decreased risk for falls and improved functional bilateral LE strength and power. Baseline: 20.07 seconds w/ BUE support, 03/12/2022 11.82 sec w/ BUE support Goal status: REVISED  3.  Pt will demonstrate a gait speed of >/=3.0 feet/sec w/ LRAD in order to decrease risk for falls. Baseline: 2.16 ft/sec w/ SPC Goal status: INITIAL  4.  Pt will ambulate >/=500 feet on 6MWT to demonstrate improved functional endurance for home  and community participation. Baseline: 383' Goal status: INITIAL  5.  Pt will improve FGA score to >/=18/30 in order to demonstrate improved balance and decreased fall risk. Baseline: 14/30; 03/12/2022 15/30 Goal status: REVISED  ASSESSMENT:  CLINICAL IMPRESSION: Focus of skilled session on hip flexor strength and engagement as well as upright posture during standing tasks.  Pt remains limited by cardiovascular comorbidities impacting endurance.  Finished session with SciFit focused on aerobic endurance.  Will continue to progress towards LTGs with PT POC.  OBJECTIVE IMPAIRMENTS Abnormal gait, decreased activity tolerance, decreased balance, decreased coordination, decreased endurance, decreased knowledge of condition, decreased knowledge of use of DME, decreased mobility, difficulty walking, decreased ROM, decreased strength, increased edema, and impaired sensation.   ACTIVITY LIMITATIONS carrying, standing, bathing, and locomotion level  PARTICIPATION LIMITATIONS: community activity  PERSONAL FACTORS Age, Education, Fitness, Past/current experiences, Time since onset of injury/illness/exacerbation, and 3+ comorbidities: multiple CVA affecting bilaterally, COPD stage 3, and HTN  are also affecting patient's functional outcome.   REHAB POTENTIAL: Fair See personal factors and PMH  CLINICAL DECISION MAKING: Evolving/moderate complexity  EVALUATION COMPLEXITY: Moderate  PLAN: PT FREQUENCY: 1x/week  PT DURATION: 12 weeks  PLANNED INTERVENTIONS: Therapeutic exercises, Therapeutic activity, Neuromuscular re-education, Balance training, Gait training, Patient/Family education, Joint mobilization, Stair training, Vestibular training, DME instructions, Manual therapy, and Re-evaluation  PLAN FOR NEXT SESSION:  Update HEP for LE strength and balance prn, stair training, gait training w/ and w/o AD, SciFit for endurance, monitor spO2, hip flexion activities, step ups, continue ladder drills  for step length, postural exercises in sitting    Bary Richard, PT, DPT 03/26/2022, 2:51 PM

## 2022-03-30 NOTE — Progress Notes (Signed)
Cardiology Office Note:    Date:  04/02/2022   ID:  Todd Elliott, DOB 23-Feb-1956, MRN [id number]  PCP:  [name], DO   [facility] Providers Cardiologist:  [name], MD Referring MD: [name], DO   Chief Complaint/Reason for Referral: History of stroke  ASSESSMENT:    Cerebrovascular accident (CVA), unspecified mechanism ([place])  Aortic atherosclerosis ([place])  Hypertension, unspecified type  Hyperlipidemia, unspecified hyperlipidemia type  BMI 39.0-39.9,adult  Chronic obstructive pulmonary disease, unspecified COPD type ([place])  Edema, unspecified type    PLAN:    In order of problems listed above:  1.  CVA: There does not seem to be a cardiac etiology for the patient's strokes other than hypertension.  No intracardiac shunt or LV thrombus was noted and the patient had a reassuring 30-day monitor which demonstrated no atrial fibrillation.  We will continue to control cardiovascular risk factors.  He is on Plavix monotherapy per neurology.  Follow-up in 1 year or earlier if needed. 2.  Aortic atherosclerosis: Continue Plavix in lieu of aspirin and statin with strict blood pressure control. 3.  Hypertension: We will his Norvasc as detailed below and start hydrochlorothiazide 25 mg daily. 4.  Hyperlipidemia: LDL is at goal at 45 drawn last month.   5.  Elevated BMI: Refer to pharmacy for recommendations: 6.  COPD: This is being followed by other providers.  He unfortunately continues to smoke.  We will give him a limited prescription of Chantix and if beneficial he will need to get this from his PCP later. 7.  Edema: Lasix has not helped.  We will discontinue his Lasix.  If he has increasing shortness of breath he will let us know.  I will stop his Norvasc as well to see if this is contributing.          Dispo:  Return in about 1 year (around 04/03/2023).     Medication Adjustments/Labs and Tests Ordered: Current medicines are reviewed at length with  the patient today.  Concerns regarding medicines are outlined above.   Tests Ordered: No orders of the defined types were placed in this encounter.   Medication Changes: Meds ordered this encounter  Medications   hydrochlorothiazide (HYDRODIURIL) 25 MG tablet    Sig: Take 1 tablet (25 mg total) by mouth daily.    Dispense:  90 tablet    Refill:  3    Stop lasix, stop amlodipine   varenicline (CHANTIX PAK) 0.5 MG X 11 & 1 MG X 42 tablet    Sig: Take one 0.5 mg tablet by mouth once daily for 3 days, then increase to one 0.5 mg tablet twice daily for 4 days, then increase to one 1 mg tablet twice daily.    Dispense:  53 tablet    Refill:  0   varenicline (CHANTIX) 1 MG tablet    Sig: Take 1 tablet (1 mg total) by mouth 2 (two) times daily. Begin after completing starter pak    Dispense:  60 tablet    Refill:  1    History of Present Illness:    FOCUSED PROBLEM LIST:   1.  History of prior stroke with MRI March 2023 demonstrating chronic ischemic microvascular disease and multiple prior infarcts including bilateral pons, bilateral thalami, and cerebral white matter; on Plavix monotherapy per neurology 2.  Dyslipidemia; LP(a) less than 8.4 3.  Hypertension 4.  Severe COPD 5.  Obstructive sleep apnea 6.  Ongoing tobacco abuse 7.  BMI of  39.9 kg/m 8.  Aortic atherosclerosis on CT abdomen pelvis 2010  March 2023: The patient was seen for initial consultation regarding MRI findings showing chronic ischemic microvascular disease and multiple prior infarctions of the bilateral pons, bilateral thalami, and cerebral white matter.  Refer for bubble study echocardiogram which was negative for intracardiac shunt.  His ejection fraction was preserved at 60 to 65%.  His 30-day monitor demonstrated no atrial fibrillation and 1 episode of nonsustained ventricular tachycardia.  Artery Dopplers demonstrated very mild obstructive disease.  May 2023: The patient remains short of breath.  He is audibly  wheezing today and his visit.  He did see pulmonology recently and was started on inhalers which have helped somewhat.  He has not been taking his daily Lasix and has noted increasing peripheral edema.  He denies any exertional angina, lightheadedness, palpitations, paroxysmal nocturnal dyspnea, orthopnea.  He fortunately has not required emergency room visits or hospitalizations.  He has had no severe bleeding or bruising.  He is relatively unchanged versus last time I saw him other than the development of increasing peripheral edema.  His proBNP in care everywhere was not elevated however we increased his Lasix to 20 mg twice a day.  His Crestor was increased to 20 mg due to a LDL of 107 in January.  Today: The patient is relatively unchanged.  He does not think the Lasix has helped his breathing or his peripheral edema.  He still continues to wheeze and smoke.  He would like to try to quit smoking.  He fortunately has not required any emergency room visits or hospitalizations.  Continues to ambulate with a cane and is relatively sedentary.  He has had no recurrent signs or symptoms of stroke or bleeding issues while on Plavix monotherapy.    Current Medications: Current Meds  Medication Sig   albuterol (VENTOLIN HFA) 108 (90 Base) MCG/ACT inhaler Inhale 1-2 puffs into the lungs as needed. Every 4-6 hours as needed.   clopidogrel (PLAVIX) 75 MG tablet Take 1 tablet (75 mg total) by mouth daily.   hydrochlorothiazide (HYDRODIURIL) 25 MG tablet Take 1 tablet (25 mg total) by mouth daily.   levothyroxine (SYNTHROID, LEVOTHROID) 175 MCG tablet Take 175 mcg by mouth as directed. Take 1.5 tabs on Mondays & Fridays. Take 1 tab daily all other days.   lisinopril (ZESTRIL) 40 MG tablet Take 40 mg by mouth daily.   potassium chloride SA (KLOR-CON M) 20 MEQ tablet Take 20 mEq by mouth daily.   rosuvastatin (CRESTOR) 20 MG tablet Take 1 tablet (20 mg total) by mouth daily.   tamsulosin (FLOMAX) 0.4 MG CAPS  capsule Take 0.4 mg by mouth daily.   Tiotropium Bromide-Olodaterol (STIOLTO RESPIMAT) 2.5-2.5 MCG/ACT AERS Inhale 2 puffs into the lungs daily.   varenicline (CHANTIX PAK) 0.5 MG X 11 & 1 MG X 42 tablet Take one 0.5 mg tablet by mouth once daily for 3 days, then increase to one 0.5 mg tablet twice daily for 4 days, then increase to one 1 mg tablet twice daily.   varenicline (CHANTIX) 1 MG tablet Take 1 tablet (1 mg total) by mouth 2 (two) times daily. Begin after completing starter pak   vitamin B-12 (CYANOCOBALAMIN) 1000 MCG tablet Take 1,000 mcg by mouth daily.   VITAMIN D PO Take 25 mcg by mouth daily.   [DISCONTINUED] amLODipine (NORVASC) 5 MG tablet Take 5 mg by mouth daily.   [DISCONTINUED] furosemide (LASIX) 20 MG tablet Take 1 tablet (20 mg total) by  mouth 2 (two) times daily.     Allergies:    Patient has no known allergies.   Social History:   Social History   Tobacco Use   Smoking status: Every Day    Packs/day: 0.25    Years: 35.00    Total pack years: 8.75    Types: Cigarettes   Smokeless tobacco: Never  Substance Use Topics   Alcohol use: Yes    Comment: occasional use   Drug use: No     Family Hx: Family History  Problem Relation Age of Onset   Thyroid disease Mother    Thyroid disease Father    Colon cancer Father 59   Cancer Father        colon   Thyroid disease Sister    Thyroid disease Sister    Heart disease Maternal Grandfather    Cancer Maternal Grandfather        lymphoma   Diabetes Maternal Aunt        x2   Diabetes Maternal Uncle    Diabetes Other        Mat great GM     Review of Systems:   Please see the history of present illness.    All other systems reviewed and are negative.     EKGs/Labs/Other Test Reviewed:    EKG: Not done today previous EKGs have shown sinus rhythm  Prior CV studies:  TTE 2023 with ejection fraction of 60 to 65% with grade 1 diastolic dysfunction no significant valvular abnormalities; separate  echocardiogram demonstrated no intracardiac shunt with bubble study  Lower extremity Dopplers 2023 negative  Monitor 2023 No significant rhythm disturbances  Imaging studies that I have independently reviewed today: Mild atherosclerosis seen CT abdomen pelvis 2010  Recent Labs: 03/05/2022: ALT 20   Recent Lipid Panel Lab Results  Component Value Date/Time   CHOL 94 (L) 03/05/2022 08:50 AM   TRIG 105 03/05/2022 08:50 AM   HDL 29 (L) 03/05/2022 08:50 AM   LDLCALC 45 03/05/2022 08:50 AM    Risk Assessment/Calculations:          Physical Exam:    VS:  BP (!) 132/90   Pulse 80   Ht '5\' 6"'$  (1.676 m)   Wt 259 lb (117.5 kg)   SpO2 92%   BMI 41.80 kg/m    Wt Readings from Last 3 Encounters:  04/02/22 259 lb (117.5 kg)  03/12/22 254 lb 3.2 oz (115.3 kg)  03/05/22 257 lb 9.6 oz (116.8 kg)    GENERAL:  No apparent distress, Aox3, obese HEENT:  No carotid bruits, +2 carotid impulses, no scleral icterus CAR: RRR no murmurs, gallops, rubs, or thrills RES: Diffuse expiratory wheezes ABD:  Soft, nontender, nondistended, positive bowel sounds x 4 VASC:  +2 radial pulses, +2 carotid pulses, palpable pedal pulses NEURO:  CN 2-12 grossly intact; motor and sensory grossly intact PSYCH:  No active depression or anxiety EXT: +2 swelling bilaterally Signed, Early Osmond, MD  04/02/2022 10:48 AM    [address], [place], [place]  [address] Phone: [phone]; Fax: [phone]   Note:  This document was prepared using Dragon voice recognition software and may include unintentional dictation errors.

## 2022-04-02 ENCOUNTER — Encounter: Payer: Self-pay | Admitting: Internal Medicine

## 2022-04-02 ENCOUNTER — Encounter: Payer: Self-pay | Admitting: Physical Therapy

## 2022-04-02 ENCOUNTER — Ambulatory Visit: Payer: Medicare HMO | Admitting: Physical Therapy

## 2022-04-02 ENCOUNTER — Ambulatory Visit: Payer: Medicare HMO | Admitting: Internal Medicine

## 2022-04-02 VITALS — BP 132/90 | HR 80 | Ht 66.0 in | Wt 259.0 lb

## 2022-04-02 VITALS — HR 83

## 2022-04-02 DIAGNOSIS — I10 Essential (primary) hypertension: Secondary | ICD-10-CM | POA: Diagnosis not present

## 2022-04-02 DIAGNOSIS — R2681 Unsteadiness on feet: Secondary | ICD-10-CM

## 2022-04-02 DIAGNOSIS — I700 Atherosclerosis of aorta: Secondary | ICD-10-CM

## 2022-04-02 DIAGNOSIS — R278 Other lack of coordination: Secondary | ICD-10-CM

## 2022-04-02 DIAGNOSIS — E785 Hyperlipidemia, unspecified: Secondary | ICD-10-CM

## 2022-04-02 DIAGNOSIS — I639 Cerebral infarction, unspecified: Secondary | ICD-10-CM

## 2022-04-02 DIAGNOSIS — R609 Edema, unspecified: Secondary | ICD-10-CM

## 2022-04-02 DIAGNOSIS — R2689 Other abnormalities of gait and mobility: Secondary | ICD-10-CM

## 2022-04-02 DIAGNOSIS — J449 Chronic obstructive pulmonary disease, unspecified: Secondary | ICD-10-CM

## 2022-04-02 DIAGNOSIS — M6281 Muscle weakness (generalized): Secondary | ICD-10-CM

## 2022-04-02 DIAGNOSIS — Z6839 Body mass index (BMI) 39.0-39.9, adult: Secondary | ICD-10-CM

## 2022-04-02 MED ORDER — HYDROCHLOROTHIAZIDE 25 MG PO TABS: 25.0000 mg | ORAL_TABLET | Freq: Every day | ORAL | 3 refills | Status: DC

## 2022-04-02 MED ORDER — VARENICLINE TARTRATE 0.5 MG X 11 & 1 MG X 42 PO TBPK: ORAL_TABLET | ORAL | 0 refills | Status: DC

## 2022-04-02 MED ORDER — VARENICLINE TARTRATE 1 MG PO TABS: 1.0000 mg | ORAL_TABLET | Freq: Two times a day (BID) | ORAL | 1 refills | Status: DC

## 2022-04-02 NOTE — Therapy (Signed)
OUTPATIENT PHYSICAL THERAPY NEURO TREATMENT NOTE   Patient Name: Todd Elliott MRN: [id number] DOB:26-Feb-1956, 66 y.o., male Today's Date: 04/02/2022   PCP: [name], DO REFERRING PROVIDER: Frann Rider, NP    PT End of Session - 04/02/22 1415     Visit Number 9    Number of Visits 13   12+eval   Date for PT Re-Evaluation 05/14/22   Pushed out due to scheduling.   Authorization Type [employer] MEDICARE    PT Start Time 6812    PT Stop Time 7517    PT Time Calculation (min) 42 min    Activity Tolerance Patient tolerated treatment well    Behavior During Therapy WFL for tasks assessed/performed              Past Medical History:  Diagnosis Date   Adrenal hyperplasia ([place])    Anal fistula    CHRONIC   COPD (chronic obstructive pulmonary disease) ([place])    Dyslipidemia    ED (erectile dysfunction)    Hemorrhoid    Hypercholesterolemia    Hypertension    Hypothyroidism    Left sided numbness    Past Surgical History:  Procedure Laterality Date   ANAL FISTULOTOMY N/A 01/30/2014   Procedure: ANAL FISTULOTOMY WITH DRAIN PLACEMENT;  Surgeon: [name], MD;  Location: [place];  Service: General;  Laterality: N/A;   CHOLECYSTECTOMY  2013   COLONOSCOPY     EVALUATION UNDER ANESTHESIA WITH ANAL FISTULECTOMY N/A 06/26/2014   Procedure: ANAL EXAM UNDER ANESTHESIA ;  Surgeon: [name], MD;  Location: [place];  Service: General;  Laterality: N/A;   EVALUATION UNDER ANESTHESIA WITH ANAL FISTULECTOMY N/A 04/17/2015   Procedure: EXAM UNDER ANESTHESIA WITH ANAL FISTULOTOMY;  Surgeon: [name], MD;  Location: [place];  Service: General;  Laterality: N/A;   EXAMINATION UNDER ANESTHESIA N/A 01/30/2014   Procedure: EXAM UNDER ANESTHESIA;  Surgeon: [name], MD;  Location: [place];  Service: General;  Laterality: N/A;   INCISION AND DRAINAGE PERIRECTAL ABSCESS Left 12/27/2013   Procedure:  IRRIGATION AND DEBRIDEMENT PERIRECTAL ABSCESS;  Surgeon: Edward Jolly, MD;  Location: [facility];  Service: General;  Laterality: Left;   INCISION AND DRAINAGE PERIRECTAL ABSCESS N/A 02/10/2014   Procedure: IRRIGATION AND DEBRIDEMENT PERIRECTAL ABSCESS;  Surgeon: [name], MD;  Location: [place];  Service: General;  Laterality: N/A;   INCISION AND DRAINAGE PERIRECTAL ABSCESS  06/26/2014   Procedure: IRRIGATION AND DEBRIDEMENT PERIRECTAL ABSCESS;  Surgeon: [name], MD;  Location: [place];  Service: General;;   LAPAROSOPCY CONVERTED TO OPEN ILEOCECOSTOMY FOR GANGRENOUS RUPTURED APPENDIX  08-11-2005   ORIF LEFT SCAPHOID FX MID SHAFT  12-27-2003   ORIF RADIUS FX RIGHT MID SHAFT/ CLOSED REDUCTION RADIUS AND ULNAR DISLOCATION WRIST/  I & D AND CLOSURE  11-21-2003   VENTRAL HERNIA REPAIR  2013   Patient Active Problem List   Diagnosis Date Noted   Gait abnormality 09/28/2021   Paresthesia 09/28/2021   Left leg swelling 09/28/2021   Stage 3 severe COPD by GOLD classification ([place]) 05/29/2021   Anal abscess 02/10/2014   Perirectal abscess 12/27/2013   Rectal pain 12/26/2013   Personal history of colonic polyps 12/26/2013   ABNORMAL FINDINGS GI TRACT 03/03/2009   ADRENAL MASS, BILATERAL 02/21/2009   FECAL INCONTINENCE 01/09/2009   CHANGE IN BOWELS 01/09/2009   ABDOMINAL PAIN-RLQ 01/09/2009    ONSET DATE: 01/13/2022   REFERRING DIAG: R20.2 (ICD-10-CM) -  Paresthesia of right upper and lower extremity Z86.73 (ICD-10-CM) - History of multiple strokes   THERAPY DIAG:  Other lack of coordination  Unsteadiness on feet  Muscle weakness (generalized)  Other abnormalities of gait and mobility  Rationale for Evaluation and Treatment Rehabilitation  SUBJECTIVE:                                                                                                                                                                                              SUBJECTIVE  STATEMENT: Pt states he has been walking and lifting his legs at the counter.  Has not had any falls or near misses recently.  Wife states the cardiologist is changing med regimen to combine diuretic w/ BP med. Pt accompanied by: significant other - [name] (wife)  PERTINENT HISTORY: multiple CVA affecting bilaterally-most recent occurred 10/2021, adrenal hyperplasia, COPD stage 3, hypercholesterolemia, HTN  PAIN: Are you having pain? No-states the right hand is more numb today starting this morning  PRECAUTIONS: Fall  WEIGHT BEARING RESTRICTIONS No  OBJECTIVE:   TODAY'S TREATMENT:  Today's Vitals   04/02/22 1414  Pulse: 83  SpO2: 95%   Assessed LTGs this session, updated to 12 weeks goals.  -Verbally reviewed HEP.  Pt unable to do backwards walking much d/t wife's work schedule.  -5xSTS (Trial 1) w/ BUE support 15.19 sec; (Trial 2) 11.72 sec w/ hands-on-knees -10MWT:  17.20 sec w/ SPC (trial 1); 20.16 sec w/ SPC (pt moderately dyspneic w/ mild wheezing-also noted by cardiologist at visit earlier this morning per wife) = 0.58 m/sec OR 1.92 ft/sec  SpO2 94%, HR 80bpm. -6MWT:  24' w/ SPC, pt w/ intense audible wheezing and prolonged labored breathing.  Assessed SpO2 95, HR 83bpm.  Pt states he does not feel worse than prior visits today.   PATIENT EDUCATION: Education details: Continue HEP.  Continue monitoring BLE edema fluctuation.  Practice deep breathing techniques reviewed in prior sessions. Person educated: Patient and Spouse Education method: Explanation Education comprehension: verbalized understanding and needs further education   HOME EXERCISE PROGRAM: Access Code: [facility] URL: [email or web address] Date: 02/12/2022 Prepared by: Elease Etienne  Exercises - Sit to Stand  - 1 x daily - 5 x weekly - 2-3 sets - 8 reps - Seated Hamstring Curls with Resistance  - 1 x daily - 5 x weekly - 2 sets - 20 reps- green theraband - Standing Hip Abduction with  Counter Support  - 1 x daily - 5 x weekly - 2 sets - 10 reps - Backward Walking with Counter Support  - 1 x daily - 5 x weekly - 3 sets -  10 reps - Seated Heel Raise  - 1 x daily - 5 x weekly - 2 sets - 20 reps Walking program:  2 minutes 3-4x per day 3-4 days per week with supervision from wife.  GOALS: Goals reviewed with patient? Yes  SHORT TERM GOALS: Target date: 03/05/2022  Pt will be independent in performance of initial strength and balance HEP with supervision from family. Baseline:  Pt states compliance, wife states intermittent compliance to standing exercise and walking. Goal status: MET  2.  Pt will decrease 5xSTS to </= 16 seconds in order to demonstrate decreased risk for falls and improved functional bilateral LE strength and power. Baseline: 20.07 seconds w/ BUE support; 11.82 sec w/ BUE support Goal status: MET  3.  Pt will demonstrate a gait speed of >/=2.5 feet/sec w/ LRAD in order to decrease risk for falls. Baseline: 2.16 ft/sec w/ SPC; 03/12/2022 2.72 ft/sec w/ SPC Goal status: MET  4.  Pt will ambulate >/=400' feet on 6MWT to demonstrate improved functional endurance for home and community participation. Baseline: 383'; 298' Goal status: NOT MET  5. Pt will improve FGA score to >/=18/30 in order to demonstrate improved balance and decreased fall risk. Baseline: 14/30; 03/12/2022 15/30 Goal status: NOT MET  UPDATED LONG TERM GOALS: Target date:  04/30/2022 (12 weeks)  Pt will be independent with finalized strength and balance HEP with supervision from family. Baseline: Established with pt compliant, would benefit from updates. Goal status: IN PROGRESS  2.  Pt will decrease 5xSTS to </=13 seconds w/o UE support in order to demonstrate decreased risk for falls and improved functional bilateral LE strength and power. Baseline: 20.07 seconds w/ BUE support, 03/12/2022 11.82 sec w/ BUE support; 04/02/2022 11.72 sec w/ hands-on-knees Goal status: IN PROGRESS  3.  Pt  will demonstrate a gait speed of >/=3.0 feet/sec w/ LRAD in order to decrease risk for falls. Baseline: 2.16 ft/sec w/ SPC; 04/02/2022 1.92 ft/sec w/ SPC Goal status: IN PROGRESS  4.  Pt will ambulate >/=500 feet on 6MWT to demonstrate improved functional endurance for home and community participation. Baseline: 383'; 04/02/2022 345' Goal status: IN PROGRESS  5.  Pt will improve FGA score to >/=18/30 in order to demonstrate improved balance and decreased fall risk. Baseline: 14/30; 03/12/2022 15/30 Goal status: REVISED  ASSESSMENT:  CLINICAL IMPRESSION: Pt O2 remains stable throughout session with more audible wheeze worsened with activity this visit.  Assessed and updated LTGs this session.  Pt would benefit from updated HEP for added balance and activity tolerance challenges.  He is able to complete 5xSTS in 11.72 sec with hands-on-knees which is progressing towards goal of no UE use.  He gait speed was diminished this visit from prior assessment to 1.92 ft/sec and he could only tolerate 345' of ambulation during 6MWT before requiring prolonged seated rest.  FGA to be assessed next session to further assess progress with dynamic balance tasks.  Overall, he continues to benefit from skilled PT POC to promote improved endurance and management of physical activity in setting of pulmonary dysfunction.  OBJECTIVE IMPAIRMENTS Abnormal gait, decreased activity tolerance, decreased balance, decreased coordination, decreased endurance, decreased knowledge of condition, decreased knowledge of use of DME, decreased mobility, difficulty walking, decreased ROM, decreased strength, increased edema, and impaired sensation.   ACTIVITY LIMITATIONS carrying, standing, bathing, and locomotion level  PARTICIPATION LIMITATIONS: community activity  PERSONAL FACTORS Age, Education, Fitness, Past/current experiences, Time since onset of injury/illness/exacerbation, and 3+ comorbidities: multiple CVA affecting  bilaterally, COPD stage 3,  and HTN  are also affecting patient's functional outcome.   REHAB POTENTIAL: Fair See personal factors and PMH  CLINICAL DECISION MAKING: Evolving/moderate complexity  EVALUATION COMPLEXITY: Moderate  PLAN: PT FREQUENCY: 1x/week  PT DURATION: 12 weeks  PLANNED INTERVENTIONS: Therapeutic exercises, Therapeutic activity, Neuromuscular re-education, Balance training, Gait training, Patient/Family education, Joint mobilization, Stair training, Vestibular training, DME instructions, Manual therapy, and Re-evaluation  PLAN FOR NEXT SESSION:  Update HEP for LE strength and balance prn, stair training, gait training w/ and w/o AD, SciFit for endurance, monitor spO2, hip flexion activities, step ups, continue ladder drills for step length, postural exercises in sitting    Bary Richard, PT, DPT 04/02/2022, 2:58 PM

## 2022-04-02 NOTE — Patient Instructions (Signed)
Medication Instructions:  Your physician has recommended you make the following change in your medication:  1.) stop furosemide 2.) stop amlodipine 3.) start hydrochlorothiazide 25 mg (hctz) one tablet daily 4.) Chantix - take as directed  *If you need a refill on your cardiac medications before your next appointment, please call your pharmacy*   Lab Work: none If you have labs (blood work) drawn today and your tests are completely normal, you will receive your results only by: [place] (if you have MyChart) OR A paper copy in the mail If you have any lab test that is abnormal or we need to change your treatment, we will call you to review the results.   Testing/Procedures: none   Follow-Up: At [facility], you and your health needs are our priority.  As part of our continuing mission to provide you with exceptional heart care, we have created designated Provider Care Teams.  These Care Teams include your primary Cardiologist (physician) and Advanced Practice Providers (APPs -  Physician Assistants and Nurse Practitioners) who all work together to provide you with the care you need, when you need it.  Your next appointment:   12 month(s)  The format for your next appointment:   In Person  Provider:   Early Osmond, MD     Important Information About Sugar

## 2022-04-09 ENCOUNTER — Ambulatory Visit: Payer: Medicare HMO | Admitting: Physical Therapy

## 2022-04-09 ENCOUNTER — Encounter: Payer: Self-pay | Admitting: Physical Therapy

## 2022-04-09 VITALS — HR 88

## 2022-04-09 DIAGNOSIS — R278 Other lack of coordination: Secondary | ICD-10-CM

## 2022-04-09 DIAGNOSIS — R2689 Other abnormalities of gait and mobility: Secondary | ICD-10-CM

## 2022-04-09 DIAGNOSIS — R2681 Unsteadiness on feet: Secondary | ICD-10-CM

## 2022-04-09 DIAGNOSIS — M6281 Muscle weakness (generalized): Secondary | ICD-10-CM

## 2022-04-09 NOTE — Therapy (Signed)
OUTPATIENT PHYSICAL THERAPY NEURO TREATMENT NOTE   Patient Name: Todd Elliott MRN: [id number] DOB:12/02/1955, 66 y.o., male Today's Date: 04/09/2022   PCP: [name], DO REFERRING PROVIDER: Frann Rider, NP   PT progress note for [name].  Reporting period 02/03/2022 to 04/09/2022  See Note below for Objective Data and Assessment of Progress/Goals  Thank you for the referral of this patient. Elease Etienne, PT, DPT    PT End of Session - 04/09/22 1413     Visit Number 10    Number of Visits 13   12+eval   Date for PT Re-Evaluation 05/14/22   Pushed out due to scheduling.   Authorization Type [employer] MEDICARE    PT Start Time 1402    PT Stop Time 8101    PT Time Calculation (min) 43 min    Activity Tolerance Patient tolerated treatment well    Behavior During Therapy WFL for tasks assessed/performed              Past Medical History:  Diagnosis Date   Adrenal hyperplasia ([place])    Anal fistula    CHRONIC   COPD (chronic obstructive pulmonary disease) ([place])    Dyslipidemia    ED (erectile dysfunction)    Hemorrhoid    Hypercholesterolemia    Hypertension    Hypothyroidism    Left sided numbness    Past Surgical History:  Procedure Laterality Date   ANAL FISTULOTOMY N/A 01/30/2014   Procedure: ANAL FISTULOTOMY WITH DRAIN PLACEMENT;  Surgeon: [name], MD;  Location: [place];  Service: General;  Laterality: N/A;   CHOLECYSTECTOMY  2013   COLONOSCOPY     EVALUATION UNDER ANESTHESIA WITH ANAL FISTULECTOMY N/A 06/26/2014   Procedure: ANAL EXAM UNDER ANESTHESIA ;  Surgeon: [name], MD;  Location: [place];  Service: General;  Laterality: N/A;   EVALUATION UNDER ANESTHESIA WITH ANAL FISTULECTOMY N/A 04/17/2015   Procedure: EXAM UNDER ANESTHESIA WITH ANAL FISTULOTOMY;  Surgeon: [name], MD;  Location: [place];  Service: General;  Laterality: N/A;   EXAMINATION UNDER ANESTHESIA  N/A 01/30/2014   Procedure: EXAM UNDER ANESTHESIA;  Surgeon: [name], MD;  Location: [place];  Service: General;  Laterality: N/A;   INCISION AND DRAINAGE PERIRECTAL ABSCESS Left 12/27/2013   Procedure: IRRIGATION AND DEBRIDEMENT PERIRECTAL ABSCESS;  Surgeon: Edward Jolly, MD;  Location: [facility];  Service: General;  Laterality: Left;   INCISION AND DRAINAGE PERIRECTAL ABSCESS N/A 02/10/2014   Procedure: IRRIGATION AND DEBRIDEMENT PERIRECTAL ABSCESS;  Surgeon: [name], MD;  Location: [place];  Service: General;  Laterality: N/A;   INCISION AND DRAINAGE PERIRECTAL ABSCESS  06/26/2014   Procedure: IRRIGATION AND DEBRIDEMENT PERIRECTAL ABSCESS;  Surgeon: [name], MD;  Location: [place];  Service: General;;   LAPAROSOPCY CONVERTED TO OPEN ILEOCECOSTOMY FOR GANGRENOUS RUPTURED APPENDIX  08-11-2005   ORIF LEFT SCAPHOID FX MID SHAFT  12-27-2003   ORIF RADIUS FX RIGHT MID SHAFT/ CLOSED REDUCTION RADIUS AND ULNAR DISLOCATION WRIST/  I & D AND CLOSURE  11-21-2003   VENTRAL HERNIA REPAIR  2013   Patient Active Problem List   Diagnosis Date Noted   Gait abnormality 09/28/2021   Paresthesia 09/28/2021   Left leg swelling 09/28/2021   Stage 3 severe COPD by GOLD classification ([place]) 05/29/2021   Anal abscess 02/10/2014   Perirectal abscess 12/27/2013   Rectal pain 12/26/2013   Personal history of colonic polyps 12/26/2013   ABNORMAL FINDINGS  GI TRACT 03/03/2009   ADRENAL MASS, BILATERAL 02/21/2009   FECAL INCONTINENCE 01/09/2009   CHANGE IN BOWELS 01/09/2009   ABDOMINAL PAIN-RLQ 01/09/2009    ONSET DATE: 01/13/2022   REFERRING DIAG: R20.2 (ICD-10-CM) - Paresthesia of right upper and lower extremity Z86.73 (ICD-10-CM) - History of multiple strokes   THERAPY DIAG:  Other lack of coordination  Unsteadiness on feet  Muscle weakness (generalized)  Other abnormalities of gait and mobility  Rationale for Evaluation and Treatment  Rehabilitation  SUBJECTIVE:                                                                                                                                                                                              SUBJECTIVE STATEMENT: Pt has been getting in some longer walks doing more functional tasks at home like helping dad fix lawnmower - "supervising".  Pt has left eye redness w/ mild eyelid swelling, no visible drainage that he reports has made vision mildly cloudy but this all happened randomly over night a couple nights ago. Pt accompanied by: significant other - [name] (wife)  PERTINENT HISTORY: multiple CVA affecting bilaterally-most recent occurred 10/2021, adrenal hyperplasia, COPD stage 3, hypercholesterolemia, HTN  PAIN: Are you having pain? No  PRECAUTIONS: Fall  WEIGHT BEARING RESTRICTIONS No  OBJECTIVE:   TODAY'S TREATMENT:  Today's Vitals   04/09/22 1415  Pulse: 88  SpO2: 95%   Middle rows 2x12, pt demonstrates inc tricep and posterior delt compensation  Purple ball toss for power in multidirections > 1.1lb ball w/ pt having inc difficulty with power production  Seated boxing jabs > cross punches > moving target > punching down > lateral punches > 43min quick punches SpO2 97%; HR 94bpm Pt ambulates 230' in 4 mins 22 sec pushed to tolerance, severe dyspnea on exertion w/ SpO2 96%, 92bpm.  PATIENT EDUCATION: Education details: Continue HEP, walking program, and wear compression socks on regular schedule.  Discussed calling nurse line/PCP about eye. Person educated: Patient and Spouse Education method: Explanation Education comprehension: verbalized understanding and needs further education   HOME EXERCISE PROGRAM: Access Code: [facility] URL: [email or web address] Date: 02/12/2022 Prepared by: Elease Etienne  Exercises - Sit to Stand  - 1 x daily - 5 x weekly - 2-3 sets - 8 reps - Seated Hamstring Curls with Resistance  - 1 x daily - 5  x weekly - 2 sets - 20 reps- green theraband - Standing Hip Abduction with Counter Support  - 1 x daily - 5 x weekly - 2 sets - 10 reps - Backward Walking with Counter Support  - 1 x daily - 5 x  weekly - 3 sets - 10 reps - Seated Heel Raise  - 1 x daily - 5 x weekly - 2 sets - 20 reps Walking program:  2 minutes 3-4x per day 3-4 days per week with supervision from wife.  GOALS: Goals reviewed with patient? Yes  SHORT TERM GOALS: Target date: 03/05/2022  Pt will be independent in performance of initial strength and balance HEP with supervision from family. Baseline:  Pt states compliance, wife states intermittent compliance to standing exercise and walking. Goal status: MET  2.  Pt will decrease 5xSTS to </= 16 seconds in order to demonstrate decreased risk for falls and improved functional bilateral LE strength and power. Baseline: 20.07 seconds w/ BUE support; 11.82 sec w/ BUE support Goal status: MET  3.  Pt will demonstrate a gait speed of >/=2.5 feet/sec w/ LRAD in order to decrease risk for falls. Baseline: 2.16 ft/sec w/ SPC; 03/12/2022 2.72 ft/sec w/ SPC Goal status: MET  4.  Pt will ambulate >/=400' feet on to demonstrate improved functional endurance for home and community participation. Baseline: 383'; 298' Goal status: NOT MET  5. Pt will improve FGA score to >/=18/30 in order to demonstrate improved balance and decreased fall risk. Baseline: 14/30; 03/12/2022 15/30 Goal status: NOT MET  UPDATED LONG TERM GOALS: Target date:  04/30/2022 (12 weeks)  Pt will be independent with finalized strength and balance HEP with supervision from family. Baseline: Established with pt compliant, would benefit from updates. Goal status: IN PROGRESS  2.  Pt will decrease 5xSTS to </=13 seconds w/o UE support in order to demonstrate decreased risk for falls and improved functional bilateral LE strength and power. Baseline: 20.07 seconds w/ BUE support, 03/12/2022 11.82 sec w/ BUE  support; 04/02/2022 11.72 sec w/ hands-on-knees Goal status: IN PROGRESS  3.  Pt will demonstrate a gait speed of >/=3.0 feet/sec w/ LRAD in order to decrease risk for falls. Baseline: 2.16 ft/sec w/ SPC; 04/02/2022 1.92 ft/sec w/ SPC Goal status: IN PROGRESS  4.  Pt will ambulate >/=500 feet on to demonstrate improved functional endurance for home and community participation. Baseline: 383'; 04/02/2022 345' Goal status: IN PROGRESS  5.  Pt will improve FGA score to >/=18/30 in order to demonstrate improved balance and decreased fall risk. Baseline: 14/30; 03/12/2022 15/30 Goal status: REVISED  ASSESSMENT:  CLINICAL IMPRESSION: Pt remains most limited by severe exertional dyspnea and perceived difficulty breathing despite stable O2 throughout session.  Focus of skilled session on postural activity in sitting with pt exerting UE and periscapular musculature.  Will further progress activity tolerance in remaining visits per POC as pt is making modest progress towards LTGs.  OBJECTIVE IMPAIRMENTS Abnormal gait, decreased activity tolerance, decreased balance, decreased coordination, decreased endurance, decreased knowledge of condition, decreased knowledge of use of DME, decreased mobility, difficulty walking, decreased ROM, decreased strength, increased edema, and impaired sensation.   ACTIVITY LIMITATIONS carrying, standing, bathing, and locomotion level  PARTICIPATION LIMITATIONS: community activity  PERSONAL FACTORS Age, Education, Fitness, Past/current experiences, Time since onset of injury/illness/exacerbation, and 3+ comorbidities: multiple CVA affecting bilaterally, COPD stage 3, and HTN  are also affecting patient's functional outcome.   REHAB POTENTIAL: Fair See personal factors and PMH  CLINICAL DECISION MAKING: Evolving/moderate complexity  EVALUATION COMPLEXITY: Moderate  PLAN: PT FREQUENCY: 1x/week  PT DURATION: 12 weeks  PLANNED INTERVENTIONS: Therapeutic  exercises, Therapeutic activity, Neuromuscular re-education, Balance training, Gait training, Patient/Family education, Joint mobilization, Stair training, Vestibular training, DME instructions, Manual therapy, and Re-evaluation  PLAN FOR NEXT SESSION:  Update HEP for LE strength and balance prn, stair training, gait training w/ and w/o AD, SciFit for endurance, monitor spO2, hip flexion activities, step ups, continue ladder drills for step length, postural exercises in sitting-progress boxing to standing    Bary Richard, PT, DPT 04/09/2022, 4:34 PM

## 2022-04-16 ENCOUNTER — Ambulatory Visit: Payer: Medicare HMO | Attending: Adult Health | Admitting: Physical Therapy

## 2022-04-16 ENCOUNTER — Encounter: Payer: Self-pay | Admitting: Physical Therapy

## 2022-04-16 VITALS — BP 110/75 | HR 82

## 2022-04-16 DIAGNOSIS — R278 Other lack of coordination: Secondary | ICD-10-CM

## 2022-04-16 DIAGNOSIS — M6281 Muscle weakness (generalized): Secondary | ICD-10-CM | POA: Diagnosis present

## 2022-04-16 DIAGNOSIS — R2689 Other abnormalities of gait and mobility: Secondary | ICD-10-CM | POA: Diagnosis present

## 2022-04-16 DIAGNOSIS — R2681 Unsteadiness on feet: Secondary | ICD-10-CM

## 2022-04-16 NOTE — Therapy (Signed)
OUTPATIENT PHYSICAL THERAPY NEURO TREATMENT NOTE   Patient Name: Todd Elliott MRN: [id number] DOB:08/03/1956, 66 y.o., male Today's Date: 04/16/2022   PCP: Todd Belt, DO REFERRING PROVIDER: Frann Rider, NP   PT progress note for [name].  Reporting period 02/03/2022 to 04/09/2022  See Note below for Objective Data and Assessment of Progress/Goals  Thank you for the referral of this patient. Todd Elliott, PT, DPT    PT End of Session - 04/16/22 1413     Visit Number 11    Number of Visits 13   12+eval   Date for PT Re-Evaluation 05/14/22   Pushed out due to scheduling.   Authorization Type [employer] MEDICARE    PT Start Time 5038    PT Stop Time 1450    PT Time Calculation (min) 47 min    Activity Tolerance Patient tolerated treatment well    Behavior During Therapy WFL for tasks assessed/performed              Past Medical History:  Diagnosis Date   Adrenal hyperplasia ([place])    Anal fistula    CHRONIC   COPD (chronic obstructive pulmonary disease) ([place])    Dyslipidemia    ED (erectile dysfunction)    Hemorrhoid    Hypercholesterolemia    Hypertension    Hypothyroidism    Left sided numbness    Past Surgical History:  Procedure Laterality Date   ANAL FISTULOTOMY N/A 01/30/2014   Procedure: ANAL FISTULOTOMY WITH DRAIN PLACEMENT;  Surgeon: Todd Ruff, MD;  Location: [facility];  Service: General;  Laterality: N/A;   CHOLECYSTECTOMY  2013   COLONOSCOPY     EVALUATION UNDER ANESTHESIA WITH ANAL FISTULECTOMY N/A 06/26/2014   Procedure: ANAL EXAM UNDER ANESTHESIA ;  Surgeon: Todd Ruff, MD;  Location: [facility];  Service: General;  Laterality: N/A;   EVALUATION UNDER ANESTHESIA WITH ANAL FISTULECTOMY N/A 04/17/2015   Procedure: EXAM UNDER ANESTHESIA WITH ANAL FISTULOTOMY;  Surgeon: Todd Ruff, MD;  Location: [facility];  Service: General;  Laterality: N/A;   EXAMINATION UNDER ANESTHESIA  N/A 01/30/2014   Procedure: EXAM UNDER ANESTHESIA;  Surgeon: Todd Ruff, MD;  Location: [facility];  Service: General;  Laterality: N/A;   INCISION AND DRAINAGE PERIRECTAL ABSCESS Left 12/27/2013   Procedure: IRRIGATION AND DEBRIDEMENT PERIRECTAL ABSCESS;  Surgeon: Todd Jolly, MD;  Location: [facility];  Service: General;  Laterality: Left;   INCISION AND DRAINAGE PERIRECTAL ABSCESS N/A 02/10/2014   Procedure: IRRIGATION AND DEBRIDEMENT PERIRECTAL ABSCESS;  Surgeon: Todd Ruff, MD;  Location: [facility];  Service: General;  Laterality: N/A;   INCISION AND DRAINAGE PERIRECTAL ABSCESS  06/26/2014   Procedure: IRRIGATION AND DEBRIDEMENT PERIRECTAL ABSCESS;  Surgeon: Todd Ruff, MD;  Location: [facility];  Service: General;;   LAPAROSOPCY CONVERTED TO OPEN ILEOCECOSTOMY FOR GANGRENOUS RUPTURED APPENDIX  08-11-2005   ORIF LEFT SCAPHOID FX MID SHAFT  12-27-2003   ORIF RADIUS FX RIGHT MID SHAFT/ CLOSED REDUCTION RADIUS AND ULNAR DISLOCATION WRIST/  I & D AND CLOSURE  11-21-2003   VENTRAL HERNIA REPAIR  2013   Patient Active Problem List   Diagnosis Date Noted   Gait abnormality 09/28/2021   Paresthesia 09/28/2021   Left leg swelling 09/28/2021   Stage 3 severe COPD by GOLD classification ([place]) 05/29/2021   Anal abscess 02/10/2014   Perirectal abscess 12/27/2013   Rectal pain 12/26/2013   Personal history of colonic polyps 12/26/2013   ABNORMAL FINDINGS  GI TRACT 03/03/2009   ADRENAL MASS, BILATERAL 02/21/2009   FECAL INCONTINENCE 01/09/2009   CHANGE IN BOWELS 01/09/2009   ABDOMINAL PAIN-RLQ 01/09/2009    ONSET DATE: 01/13/2022   REFERRING DIAG: R20.2 (ICD-10-CM) - Paresthesia of right upper and lower extremity Z86.73 (ICD-10-CM) - History of multiple strokes   THERAPY DIAG:  Other lack of coordination  Unsteadiness on feet  Muscle weakness (generalized)  Other abnormalities of gait and mobility  Rationale for Evaluation and Treatment  Rehabilitation  SUBJECTIVE:                                                                                                                                                                                              SUBJECTIVE STATEMENT: Pt is having a bad day and he feels like the room is hot, does not think he is having a panic attack or feeling claustrophobic.  He is tired of feeling so drugged down.  Voices that he is not the same person that he was and he is afraid he is going to have to live with this. Pt accompanied by: significant other - Todd Elliott (wife)  PERTINENT HISTORY: multiple CVA affecting bilaterally-most recent occurred 10/2021, adrenal hyperplasia, COPD stage 3, hypercholesterolemia, HTN  PAIN: Are you having pain? No  PRECAUTIONS: Fall  WEIGHT BEARING RESTRICTIONS No  OBJECTIVE:   TODAY'S TREATMENT:  Today's Vitals   04/16/22 1407  BP: 110/75  Pulse: 82  SpO2: 95%   See edu.  Reviewed existing HEP and made additions, see below for details.  PATIENT EDUCATION: Education details: Edu on BP parameters, changes, precautions and limitations to look out for based on pt concern.  Discussed discharging in 2 visits due to pt's need to for mental break to focus on other medical issues with urology and recent med changes for fluid management.  Discussed staying motivated with walking and HEP as able.  Discussed having him talk to MD about options to manage anxiety.  Provided pt and spouse with business card. Person educated: Patient and Spouse Education method: Explanation Education comprehension: verbalized understanding and needs further education  HOME EXERCISE PROGRAM: Access Code: [facility] URL: [email or web address] Date: 02/12/2022 Prepared by: Todd Elliott  Exercises - Sit to Stand  - 1 x daily - 5 x weekly - 2-3 sets - 8 reps - Seated Hamstring Curls with Resistance  - 1 x daily - 5 x weekly - 2 sets - 20 reps- green theraband - Standing  Hip Abduction with Counter Support  - 1 x daily - 5 x weekly - 2 sets - 10 reps - Backward Walking with Counter Support  -  1 x daily - 5 x weekly - 3 sets - 10 reps - Seated Heel Raise  - 1 x daily - 5 x weekly - 2 sets - 20 reps - Seated Shoulder Horizontal Abduction with Resistance  - 1 x daily - 5 x weekly - 3 sets - 10 reps - Seated Shoulder Row with Anchored Resistance  - 1 x daily - 5 x weekly - 3 sets - 10 reps - Seated Shoulder Shrugs  - 1 x daily - 5 x weekly - 3 sets - 10 reps - Seated Diaphragmatic Breathing  - 1 x daily - 5 x weekly - 1 sets - 6-8 reps Walking program:  2 minutes 3-4x per day 3-4 days per week with supervision from wife.  GOALS: Goals reviewed with patient? Yes  SHORT TERM GOALS: Target date: 03/05/2022  Pt will be independent in performance of initial strength and balance HEP with supervision from family. Baseline:  Pt states compliance, wife states intermittent compliance to standing exercise and walking. Goal status: MET  2.  Pt will decrease 5xSTS to </= 16 seconds in order to demonstrate decreased risk for falls and improved functional bilateral LE strength and power. Baseline: 20.07 seconds w/ BUE support; 11.82 sec w/ BUE support Goal status: MET  3.  Pt will demonstrate a gait speed of >/=2.5 feet/sec w/ LRAD in order to decrease risk for falls. Baseline: 2.16 ft/sec w/ SPC; 03/12/2022 2.72 ft/sec w/ SPC Goal status: MET  4.  Pt will ambulate >/=400' feet on 6MWT to demonstrate improved functional endurance for home and community participation. Baseline: 383'; 298' Goal status: NOT MET  5. Pt will improve FGA score to >/=18/30 in order to demonstrate improved balance and decreased fall risk. Baseline: 14/30; 03/12/2022 15/30 Goal status: NOT MET  UPDATED LONG TERM GOALS: Target date:  04/30/2022 (12 weeks)  Pt will be independent with finalized strength and balance HEP with supervision from family. Baseline: Established with pt compliant, would  benefit from updates. Goal status: IN PROGRESS  2.  Pt will decrease 5xSTS to </=13 seconds w/o UE support in order to demonstrate decreased risk for falls and improved functional bilateral LE strength and power. Baseline: 20.07 seconds w/ BUE support, 03/12/2022 11.82 sec w/ BUE support; 04/02/2022 11.72 sec w/ hands-on-knees Goal status: IN PROGRESS  3.  Pt will demonstrate a gait speed of >/=3.0 feet/sec w/ LRAD in order to decrease risk for falls. Baseline: 2.16 ft/sec w/ SPC; 04/02/2022 1.92 ft/sec w/ SPC Goal status: IN PROGRESS  4.  Pt will ambulate >/=500 feet on 6MWT to demonstrate improved functional endurance for home and community participation. Baseline: 383'; 04/02/2022 345' Goal status: IN PROGRESS  5.  Pt will improve FGA score to >/=18/30 in order to demonstrate improved balance and decreased fall risk. Baseline: 14/30; 03/12/2022 15/30 Goal status: REVISED  ASSESSMENT:  CLINICAL IMPRESSION: Pt is not in good spirits this session with some noted anxiety related to ongoing issues this session.  Spent initial part of session discussing concerns as appropriate, see edu section.  Remainder of session spent reviewing HEP with additions made to challenge posture and breathing technique.  Will continue to address discharge plan and ongoing deficits at coming visit per ongoing PT POC.  OBJECTIVE IMPAIRMENTS Abnormal gait, decreased activity tolerance, decreased balance, decreased coordination, decreased endurance, decreased knowledge of condition, decreased knowledge of use of DME, decreased mobility, difficulty walking, decreased ROM, decreased strength, increased edema, and impaired sensation.   ACTIVITY LIMITATIONS carrying, standing, bathing,  and locomotion level  PARTICIPATION LIMITATIONS: community activity  PERSONAL FACTORS Age, Education, Fitness, Past/current experiences, Time since onset of injury/illness/exacerbation, and 3+ comorbidities: multiple CVA affecting  bilaterally, COPD stage 3, and HTN  are also affecting patient's functional outcome.   REHAB POTENTIAL: Fair See personal factors and PMH  CLINICAL DECISION MAKING: Evolving/moderate complexity  EVALUATION COMPLEXITY: Moderate  PLAN: PT FREQUENCY: 1x/week  PT DURATION: 12 weeks  PLANNED INTERVENTIONS: Therapeutic exercises, Therapeutic activity, Neuromuscular re-education, Balance training, Gait training, Patient/Family education, Joint mobilization, Stair training, Vestibular training, DME instructions, Manual therapy, and Re-evaluation  PLAN FOR NEXT SESSION:  Plan to D/C at the end of the current cert!  Update HEP for LE strength and balance prn, stair training, gait training w/ and w/o AD, SciFit for endurance, monitor spO2, hip flexion activities, step ups, continue ladder drills for step length, postural exercises in sitting-progress boxing to standing    Bary Richard, PT, DPT 04/16/2022, 2:55 PM

## 2022-04-16 NOTE — Patient Instructions (Signed)
Access Code: [facility] URL: [email or web address] Date: 04/16/2022 Prepared by: Elease Etienne  Exercises - Sit to Stand  - 1 x daily - 5 x weekly - 2-3 sets - 8 reps - Seated Hamstring Curls with Resistance  - 1 x daily - 5 x weekly - 2 sets - 20 reps - Standing Hip Abduction with Counter Support  - 1 x daily - 5 x weekly - 2 sets - 10 reps - Backward Walking with Counter Support  - 1 x daily - 5 x weekly - 3 sets - 10 reps - Seated Heel Raise  - 1 x daily - 5 x weekly - 2 sets - 20 reps - Seated Shoulder Horizontal Abduction with Resistance  - 1 x daily - 5 x weekly - 3 sets - 10 reps - Seated Shoulder Row with Anchored Resistance  - 1 x daily - 5 x weekly - 3 sets - 10 reps - Seated Shoulder Shrugs  - 1 x daily - 5 x weekly - 3 sets - 10 reps - Seated Diaphragmatic Breathing  - 1 x daily - 5 x weekly - 1 sets - 6-8 reps

## 2022-04-23 ENCOUNTER — Ambulatory Visit: Payer: Medicare HMO | Admitting: Physical Therapy

## 2022-04-23 ENCOUNTER — Encounter: Payer: Self-pay | Admitting: Physical Therapy

## 2022-04-23 VITALS — HR 81

## 2022-04-23 DIAGNOSIS — R2689 Other abnormalities of gait and mobility: Secondary | ICD-10-CM

## 2022-04-23 DIAGNOSIS — R2681 Unsteadiness on feet: Secondary | ICD-10-CM

## 2022-04-23 DIAGNOSIS — R278 Other lack of coordination: Secondary | ICD-10-CM | POA: Diagnosis not present

## 2022-04-23 DIAGNOSIS — M6281 Muscle weakness (generalized): Secondary | ICD-10-CM

## 2022-04-23 NOTE — Therapy (Signed)
OUTPATIENT PHYSICAL THERAPY NEURO TREATMENT NOTE   Patient Name: Todd Elliott MRN: [id number] DOB:1955/10/16, 66 y.o., male Today's Date: 04/23/2022   PCP: [name], DO REFERRING PROVIDER: Frann Rider, NP   PT progress note for [name].  Reporting period 02/03/2022 to 04/09/2022  See Note below for Objective Data and Assessment of Progress/Goals  Thank you for the referral of this patient. Elease Etienne, PT, DPT    PT End of Session - 04/23/22 1409     Visit Number 12    Number of Visits 13   12+eval   Date for PT Re-Evaluation 05/14/22   Pushed out due to scheduling.   Authorization Type [employer] MEDICARE    PT Start Time 0601    PT Stop Time 5615    PT Time Calculation (min) 42 min    Activity Tolerance Patient tolerated treatment well    Behavior During Therapy WFL for tasks assessed/performed              Past Medical History:  Diagnosis Date   Adrenal hyperplasia ([place])    Anal fistula    CHRONIC   COPD (chronic obstructive pulmonary disease) ([place])    Dyslipidemia    ED (erectile dysfunction)    Hemorrhoid    Hypercholesterolemia    Hypertension    Hypothyroidism    Left sided numbness    Past Surgical History:  Procedure Laterality Date   ANAL FISTULOTOMY N/A 01/30/2014   Procedure: ANAL FISTULOTOMY WITH DRAIN PLACEMENT;  Surgeon: [name], MD;  Location: [place];  Service: General;  Laterality: N/A;   CHOLECYSTECTOMY  2013   COLONOSCOPY     EVALUATION UNDER ANESTHESIA WITH ANAL FISTULECTOMY N/A 06/26/2014   Procedure: ANAL EXAM UNDER ANESTHESIA ;  Surgeon: [name], MD;  Location: [place];  Service: General;  Laterality: N/A;   EVALUATION UNDER ANESTHESIA WITH ANAL FISTULECTOMY N/A 04/17/2015   Procedure: EXAM UNDER ANESTHESIA WITH ANAL FISTULOTOMY;  Surgeon: [name], MD;  Location: [facility];  Service: General;  Laterality: N/A;   EXAMINATION UNDER ANESTHESIA  N/A 01/30/2014   Procedure: EXAM UNDER ANESTHESIA;  Surgeon: [name], MD;  Location: [place];  Service: General;  Laterality: N/A;   INCISION AND DRAINAGE PERIRECTAL ABSCESS Left 12/27/2013   Procedure: IRRIGATION AND DEBRIDEMENT PERIRECTAL ABSCESS;  Surgeon: Edward Jolly, MD;  Location: [facility];  Service: General;  Laterality: Left;   INCISION AND DRAINAGE PERIRECTAL ABSCESS N/A 02/10/2014   Procedure: IRRIGATION AND DEBRIDEMENT PERIRECTAL ABSCESS;  Surgeon: [name], MD;  Location: [place];  Service: General;  Laterality: N/A;   INCISION AND DRAINAGE PERIRECTAL ABSCESS  06/26/2014   Procedure: IRRIGATION AND DEBRIDEMENT PERIRECTAL ABSCESS;  Surgeon: [name], MD;  Location: [place];  Service: General;;   LAPAROSOPCY CONVERTED TO OPEN ILEOCECOSTOMY FOR GANGRENOUS RUPTURED APPENDIX  08-11-2005   ORIF LEFT SCAPHOID FX MID SHAFT  12-27-2003   ORIF RADIUS FX RIGHT MID SHAFT/ CLOSED REDUCTION RADIUS AND ULNAR DISLOCATION WRIST/  I & D AND CLOSURE  11-21-2003   VENTRAL HERNIA REPAIR  2013   Patient Active Problem List   Diagnosis Date Noted   Gait abnormality 09/28/2021   Paresthesia 09/28/2021   Left leg swelling 09/28/2021   Stage 3 severe COPD by GOLD classification ([place]) 05/29/2021   Anal abscess 02/10/2014   Perirectal abscess 12/27/2013   Rectal pain 12/26/2013   Personal history of colonic polyps 12/26/2013   ABNORMAL FINDINGS  GI TRACT 03/03/2009   ADRENAL MASS, BILATERAL 02/21/2009   FECAL INCONTINENCE 01/09/2009   CHANGE IN BOWELS 01/09/2009   ABDOMINAL PAIN-RLQ 01/09/2009    ONSET DATE: 01/13/2022   REFERRING DIAG: R20.2 (ICD-10-CM) - Paresthesia of right upper and lower extremity Z86.73 (ICD-10-CM) - History of multiple strokes   THERAPY DIAG:  Other lack of coordination  Unsteadiness on feet  Muscle weakness (generalized)  Other abnormalities of gait and mobility  Rationale for Evaluation and Treatment  Rehabilitation  SUBJECTIVE:                                                                                                                                                                                              SUBJECTIVE STATEMENT: Pt is having a bad day and he feels like the room is hot, does not think he is having a panic attack or feeling claustrophobic.  He is tired of feeling so drugged down.  Voices that he is not the same person that he was and he is afraid he is going to have to live with this. Pt accompanied by: significant other - [name] (wife)  PERTINENT HISTORY: multiple CVA affecting bilaterally-most recent occurred 10/2021, adrenal hyperplasia, COPD stage 3, hypercholesterolemia, HTN  PAIN: Are you having pain? No  PRECAUTIONS: Fall  WEIGHT BEARING RESTRICTIONS No  OBJECTIVE:   TODAY'S TREATMENT:  Today's Vitals   04/23/22 1407  Pulse: 81  SpO2: 96%   -STS on airex x10; inc time to complete task -Standing boxing to bilateral targets:  static wide stance jabs and cross punches x63min > ipsilateral step and jab > contralateral step and cross punch; cued for inc trunk rotation w/ targets moved more lateral > moving target jabs, cross punches, punching overhead, and uppercuts -SciFit L1.5 x5 minutes using BUE/BLE for reciprocal mobility and cardiovascular endurance, step goal of 60 steps per min w/ 305 total achieved.  PATIENT EDUCATION: Education details: Continue HEP and attempt walking program.  Discussed using rotation and forward reaching to engage core and ribcage musculature during everyday tasks outside of HEP. Person educated: Patient and Spouse Education method: Explanation Education comprehension: verbalized understanding and needs further education  HOME EXERCISE PROGRAM: Access Code: [facility] URL: [email or web address] Date: 02/12/2022 Prepared by: Elease Etienne  Exercises - Sit to Stand  - 1 x daily - 5 x weekly - 2-3 sets - 8  reps - Seated Hamstring Curls with Resistance  - 1 x daily - 5 x weekly - 2 sets - 20 reps- green theraband - Standing Hip Abduction with Counter Support  - 1 x daily - 5 x weekly - 2 sets -  10 reps - Backward Walking with Counter Support  - 1 x daily - 5 x weekly - 3 sets - 10 reps - Seated Heel Raise  - 1 x daily - 5 x weekly - 2 sets - 20 reps - Seated Shoulder Horizontal Abduction with Resistance  - 1 x daily - 5 x weekly - 3 sets - 10 reps - Seated Shoulder Row with Anchored Resistance  - 1 x daily - 5 x weekly - 3 sets - 10 reps - Seated Shoulder Shrugs  - 1 x daily - 5 x weekly - 3 sets - 10 reps - Seated Diaphragmatic Breathing  - 1 x daily - 5 x weekly - 1 sets - 6-8 reps Walking program:  2 minutes 3-4x per day 3-4 days per week with supervision from wife.  GOALS: Goals reviewed with patient? Yes  SHORT TERM GOALS: Target date: 03/05/2022  Pt will be independent in performance of initial strength and balance HEP with supervision from family. Baseline:  Pt states compliance, wife states intermittent compliance to standing exercise and walking. Goal status: MET  2.  Pt will decrease 5xSTS to </= 16 seconds in order to demonstrate decreased risk for falls and improved functional bilateral LE strength and power. Baseline: 20.07 seconds w/ BUE support; 11.82 sec w/ BUE support Goal status: MET  3.  Pt will demonstrate a gait speed of >/=2.5 feet/sec w/ LRAD in order to decrease risk for falls. Baseline: 2.16 ft/sec w/ SPC; 03/12/2022 2.72 ft/sec w/ SPC Goal status: MET  4.  Pt will ambulate >/=400' feet on 6MWT to demonstrate improved functional endurance for home and community participation. Baseline: 383'; 298' Goal status: NOT MET  5. Pt will improve FGA score to >/=18/30 in order to demonstrate improved balance and decreased fall risk. Baseline: 14/30; 03/12/2022 15/30 Goal status: NOT MET  UPDATED LONG TERM GOALS: Target date:  04/30/2022 (12 weeks)  Pt will be independent  with finalized strength and balance HEP with supervision from family. Baseline: Established with pt compliant, would benefit from updates. Goal status: IN PROGRESS  2.  Pt will decrease 5xSTS to </=13 seconds w/o UE support in order to demonstrate decreased risk for falls and improved functional bilateral LE strength and power. Baseline: 20.07 seconds w/ BUE support, 03/12/2022 11.82 sec w/ BUE support; 04/02/2022 11.72 sec w/ hands-on-knees Goal status: IN PROGRESS  3.  Pt will demonstrate a gait speed of >/=3.0 feet/sec w/ LRAD in order to decrease risk for falls. Baseline: 2.16 ft/sec w/ SPC; 04/02/2022 1.92 ft/sec w/ SPC Goal status: IN PROGRESS  4.  Pt will ambulate >/=500 feet on 6MWT to demonstrate improved functional endurance for home and community participation. Baseline: 383'; 04/02/2022 345' Goal status: IN PROGRESS  5.  Pt will improve FGA score to >/=18/30 in order to demonstrate improved balance and decreased fall risk. Baseline: 14/30; 03/12/2022 15/30 Goal status: REVISED  ASSESSMENT:  CLINICAL IMPRESSION: Progressed boxing task to standing this session with emphasis on trunk rotation and stepping strategy to increased amplitude of movement.  Pt remains limited by anxiety related to condition especially during increased activity.  Will plan to assess LTG and discharge next session.  OBJECTIVE IMPAIRMENTS Abnormal gait, decreased activity tolerance, decreased balance, decreased coordination, decreased endurance, decreased knowledge of condition, decreased knowledge of use of DME, decreased mobility, difficulty walking, decreased ROM, decreased strength, increased edema, and impaired sensation.   ACTIVITY LIMITATIONS carrying, standing, bathing, and locomotion level  PARTICIPATION LIMITATIONS: community activity  PERSONAL FACTORS  Age, Education, Fitness, Past/current experiences, Time since onset of injury/illness/exacerbation, and 3+ comorbidities: multiple CVA affecting  bilaterally, COPD stage 3, and HTN  are also affecting patient's functional outcome.   REHAB POTENTIAL: Fair See personal factors and PMH  CLINICAL DECISION MAKING: Evolving/moderate complexity  EVALUATION COMPLEXITY: Moderate  PLAN: PT FREQUENCY: 1x/week  PT DURATION: 12 weeks  PLANNED INTERVENTIONS: Therapeutic exercises, Therapeutic activity, Neuromuscular re-education, Balance training, Gait training, Patient/Family education, Joint mobilization, Stair training, Vestibular training, DME instructions, Manual therapy, and Re-evaluation  PLAN FOR NEXT SESSION:  Assess LTG-D/C!  Update HEP for LE strength and balance prn, stair training, gait training w/ and w/o AD, SciFit for endurance, monitor spO2, hip flexion activities, step ups, continue ladder drills for step length, postural exercises in sitting-progress boxing to standing    Bary Richard, PT, DPT 04/23/2022, 2:51 PM

## 2022-04-30 ENCOUNTER — Encounter: Payer: Self-pay | Admitting: Physical Therapy

## 2022-04-30 ENCOUNTER — Ambulatory Visit: Payer: Medicare HMO | Admitting: Physical Therapy

## 2022-04-30 VITALS — HR 71

## 2022-04-30 DIAGNOSIS — R278 Other lack of coordination: Secondary | ICD-10-CM | POA: Diagnosis not present

## 2022-04-30 DIAGNOSIS — M6281 Muscle weakness (generalized): Secondary | ICD-10-CM

## 2022-04-30 DIAGNOSIS — R2689 Other abnormalities of gait and mobility: Secondary | ICD-10-CM

## 2022-04-30 DIAGNOSIS — R2681 Unsteadiness on feet: Secondary | ICD-10-CM

## 2022-04-30 NOTE — Therapy (Signed)
OUTPATIENT PHYSICAL THERAPY NEURO TREATMENT NOTE/DISCHARGE SUMMARY   Patient Name: Todd Elliott MRN: [id number] DOB:Mar 15, 1956, 66 y.o., male Today's Date: 04/30/2022   PCP: [name], DO REFERRING PROVIDER: Frann Rider, NP   PHYSICAL THERAPY DISCHARGE SUMMARY  Visits from Start of Care: 13  Current functional level related to goals / functional outcomes: See clinical assessment statement.   Remaining deficits: severe dyspnea on exertion, some anxiety noted in prior sessions, decreased cardiovascular endurance, and generalized weakness   Education / Equipment: Discharge plan, benefits of episodic care, walking progression.   Patient agrees to discharge. Patient goals were not met. Patient is being discharged due to maximized rehab potential.      PT End of Session - 04/30/22 1402     Visit Number 13    Number of Visits 13   12+eval   Date for PT Re-Evaluation 05/14/22   Pushed out due to scheduling.   Authorization Type [employer] MEDICARE    PT Start Time 4818    PT Stop Time 1443    PT Time Calculation (min) 46 min    Equipment Utilized During Treatment Gait belt    Activity Tolerance Patient tolerated treatment well;Patient limited by fatigue    Behavior During Therapy WFL for tasks assessed/performed              Past Medical History:  Diagnosis Date   Adrenal hyperplasia ([place])    Anal fistula    CHRONIC   COPD (chronic obstructive pulmonary disease) ([place])    Dyslipidemia    ED (erectile dysfunction)    Hemorrhoid    Hypercholesterolemia    Hypertension    Hypothyroidism    Left sided numbness    Past Surgical History:  Procedure Laterality Date   ANAL FISTULOTOMY N/A 01/30/2014   Procedure: ANAL FISTULOTOMY WITH DRAIN PLACEMENT;  Surgeon: [name], MD;  Location: [place];  Service: General;  Laterality: N/A;   CHOLECYSTECTOMY  2013   COLONOSCOPY     EVALUATION UNDER ANESTHESIA WITH ANAL FISTULECTOMY N/A 06/26/2014    Procedure: ANAL EXAM UNDER ANESTHESIA ;  Surgeon: [name], MD;  Location: [place];  Service: General;  Laterality: N/A;   EVALUATION UNDER ANESTHESIA WITH ANAL FISTULECTOMY N/A 04/17/2015   Procedure: EXAM UNDER ANESTHESIA WITH ANAL FISTULOTOMY;  Surgeon: [name], MD;  Location: [place];  Service: General;  Laterality: N/A;   EXAMINATION UNDER ANESTHESIA N/A 01/30/2014   Procedure: EXAM UNDER ANESTHESIA;  Surgeon: [name], MD;  Location: [place];  Service: General;  Laterality: N/A;   INCISION AND DRAINAGE PERIRECTAL ABSCESS Left 12/27/2013   Procedure: IRRIGATION AND DEBRIDEMENT PERIRECTAL ABSCESS;  Surgeon: Edward Jolly, MD;  Location: [facility];  Service: General;  Laterality: Left;   INCISION AND DRAINAGE PERIRECTAL ABSCESS N/A 02/10/2014   Procedure: IRRIGATION AND DEBRIDEMENT PERIRECTAL ABSCESS;  Surgeon: [name], MD;  Location: [place];  Service: General;  Laterality: N/A;   INCISION AND DRAINAGE PERIRECTAL ABSCESS  06/26/2014   Procedure: IRRIGATION AND DEBRIDEMENT PERIRECTAL ABSCESS;  Surgeon: [name], MD;  Location: [place];  Service: General;;   LAPAROSOPCY CONVERTED TO [place]  08-11-2005   ORIF LEFT SCAPHOID FX MID SHAFT  12-27-2003   ORIF RADIUS FX RIGHT MID SHAFT/ CLOSED REDUCTION RADIUS AND ULNAR DISLOCATION WRIST/  I & D AND CLOSURE  11-21-2003   VENTRAL HERNIA REPAIR  2013   Patient Active Problem List  Diagnosis Date Noted   Gait abnormality 09/28/2021   Paresthesia 09/28/2021   Left leg swelling 09/28/2021   Stage 3 severe COPD by GOLD classification ([place]) 05/29/2021   Anal abscess 02/10/2014   Perirectal abscess 12/27/2013   Rectal pain 12/26/2013   Personal history of colonic polyps 12/26/2013   ABNORMAL FINDINGS GI TRACT 03/03/2009   ADRENAL MASS, BILATERAL 02/21/2009   FECAL INCONTINENCE 01/09/2009   CHANGE IN  BOWELS 01/09/2009   ABDOMINAL PAIN-RLQ 01/09/2009    ONSET DATE: 01/13/2022   REFERRING DIAG: R20.2 (ICD-10-CM) - Paresthesia of right upper and lower extremity Z86.73 (ICD-10-CM) - History of multiple strokes   THERAPY DIAG:  Other lack of coordination  Unsteadiness on feet  Muscle weakness (generalized)  Other abnormalities of gait and mobility  Rationale for Evaluation and Treatment Rehabilitation  SUBJECTIVE:                                                                                                                                                                                              SUBJECTIVE STATEMENT: Pt is fatigued today due to prior appts and helping his dad in the yard.  He has done more walking than he anticipated today.  No falls or other acute changes. Pt accompanied by: significant other - [name] (wife)  PERTINENT HISTORY: multiple CVA affecting bilaterally-most recent occurred 10/2021, adrenal hyperplasia, COPD stage 3, hypercholesterolemia, HTN  PAIN: Are you having pain? No  PRECAUTIONS: Fall  WEIGHT BEARING RESTRICTIONS No  OBJECTIVE:   TODAY'S TREATMENT:  Today's Vitals   04/30/22 1400  Pulse: 71  SpO2: 97%   Discussion of discharge plan.  Edu on benefit of episodic care and obtaining a new referral for "tune up" in the future. Pt turns to illustrate a point while speaking and throws his right arm behind him and out to the side hitting a rolling table creating a small skin tear over his pre-existing blood blister.  PT cleans skin with an alcohol wipe and placed a bandage over the wound. Assessed LTGs: -Verbally reviewed HEP and gradual inc in amount of walking using standing rest breaks. -5xSTS w/ BUE support:  14.82 sec -10MWT w/ SPC:  20.25 sec = 0.49 m/sec OR 1.63 ft/sec -6MWT: 230' w/ SPC w/ 2 prolonged seated rest breaks d/t dyspnea -FGA:  [facility] PT Assessment - 04/30/22 1430       Functional Gait  Assessment   Gait assessed   Yes    Gait Level Surface Walks 20 ft, slow speed, abnormal gait pattern, evidence for imbalance or deviates 10-15 in outside of the 12 in walkway width. Requires more than  7 sec to ambulate 20 ft.    Change in Gait Speed Makes only minor adjustments to walking speed, or accomplishes a change in speed with significant gait deviations, deviates 10-15 in outside the 12 in walkway width, or changes speed but loses balance but is able to recover and continue walking.    Gait with Horizontal Head Turns Performs head turns smoothly with slight change in gait velocity (eg, minor disruption to smooth gait path), deviates 6-10 in outside 12 in walkway width, or uses an assistive device.    Gait with Vertical Head Turns Performs task with slight change in gait velocity (eg, minor disruption to smooth gait path), deviates 6 - 10 in outside 12 in walkway width or uses assistive device    Gait and Pivot Turn Pivot turns safely in greater than 3 sec and stops with no loss of balance, or pivot turns safely within 3 sec and stops with mild imbalance, requires small steps to catch balance.    Step Over Obstacle Is able to step over 2 stacked shoe boxes taped together (9 in total height) without changing gait speed. No evidence of imbalance.   pt able to complete w/o cane   Gait with Narrow Base of Support Ambulates less than 4 steps heel to toe or cannot perform without assistance.    Gait with Eyes Closed Walks 20 ft, uses assistive device, slower speed, mild gait deviations, deviates 6-10 in outside 12 in walkway width. Ambulates 20 ft in less than 9 sec but greater than 7 sec.    Ambulating Backwards Walks 20 ft, uses assistive device, slower speed, mild gait deviations, deviates 6-10 in outside 12 in walkway width.    Steps Alternating feet, must use rail.   using left rail   Total Score 17    FGA comment: High fall risk            PATIENT EDUCATION: Education details: Continue HEP and attempt walking  program.  Discharge plan. Person educated: Patient and Spouse Education method: Explanation Education comprehension: verbalized understanding and needs further education  HOME EXERCISE PROGRAM: Access Code: [facility] URL: [email or web address] Date: 02/12/2022 Prepared by: Elease Etienne  Exercises - Sit to Stand  - 1 x daily - 5 x weekly - 2-3 sets - 8 reps - Seated Hamstring Curls with Resistance  - 1 x daily - 5 x weekly - 2 sets - 20 reps- green theraband - Standing Hip Abduction with Counter Support  - 1 x daily - 5 x weekly - 2 sets - 10 reps - Backward Walking with Counter Support  - 1 x daily - 5 x weekly - 3 sets - 10 reps - Seated Heel Raise  - 1 x daily - 5 x weekly - 2 sets - 20 reps - Seated Shoulder Horizontal Abduction with Resistance  - 1 x daily - 5 x weekly - 3 sets - 10 reps - Seated Shoulder Row with Anchored Resistance  - 1 x daily - 5 x weekly - 3 sets - 10 reps - Seated Shoulder Shrugs  - 1 x daily - 5 x weekly - 3 sets - 10 reps - Seated Diaphragmatic Breathing  - 1 x daily - 5 x weekly - 1 sets - 6-8 reps Walking program:  2 minutes 3-4x per day 3-4 days per week with supervision from wife.  GOALS: Goals reviewed with patient? Yes  SHORT TERM GOALS: Target date: 03/05/2022  Pt will be independent in performance of initial strength  and balance HEP with supervision from family. Baseline:  Pt states compliance, wife states intermittent compliance to standing exercise and walking. Goal status: MET  2.  Pt will decrease 5xSTS to </= 16 seconds in order to demonstrate decreased risk for falls and improved functional bilateral LE strength and power. Baseline: 20.07 seconds w/ BUE support; 11.82 sec w/ BUE support Goal status: MET  3.  Pt will demonstrate a gait speed of >/=2.5 feet/sec w/ LRAD in order to decrease risk for falls. Baseline: 2.16 ft/sec w/ SPC; 03/12/2022 2.72 ft/sec w/ SPC Goal status: MET  4.  Pt will ambulate >/=400' feet on  6MWT to demonstrate improved functional endurance for home and community participation. Baseline: 383'; 298' Goal status: NOT MET  5. Pt will improve FGA score to >/=18/30 in order to demonstrate improved balance and decreased fall risk. Baseline: 14/30; 03/12/2022 15/30 Goal status: NOT MET  UPDATED LONG TERM GOALS: Target date:  04/30/2022 (12 weeks)  Pt will be independent with finalized strength and balance HEP with supervision from family. Baseline: Established with pt compliant, would benefit from updates. 04/30/2022 HEP up-to-date and pt compliant. Goal status: MET  2.  Pt will decrease 5xSTS to </=13 seconds w/o UE support in order to demonstrate decreased risk for falls and improved functional bilateral LE strength and power. Baseline: 20.07 seconds w/ BUE support, 03/12/2022 11.82 sec w/ BUE support; 04/02/2022 11.72 sec w/ hands-on-knees; 04/30/2022 14.82 sec w/ BUE support Goal status: NOT MET  3.  Pt will demonstrate a gait speed of >/=3.0 feet/sec w/ LRAD in order to decrease risk for falls. Baseline: 2.16 ft/sec w/ SPC; 04/02/2022 1.92 ft/sec w/ SPC; 04/30/2022 1.63 ft/sec Goal status: NOT MET  4.  Pt will ambulate >/=500 feet on 6MWT to demonstrate improved functional endurance for home and community participation. Baseline: 383'; 04/02/2022 345'; 04/30/2022 230' Goal status: NOT MET  5.  Pt will improve FGA score to >/=18/30 in order to demonstrate improved balance and decreased fall risk. Baseline: 14/30; 03/12/2022 15/30; 04/30/2022 17/30 Goal status: NOT MET  ASSESSMENT:  CLINICAL IMPRESSION: Assess pt's LTGs this session in preparation for discharge today.  Pt did make progress towards FGA goal, but did not meet goal level for any of his 5 LTGs.  He was somewhat limited by fatigue this session as he did more walking than he is used to with his doctor's appt and helping his dad in the yard this morning.  He remains limited by severe dyspnea on exertion, some anxiety noted in  prior sessions, decreased cardiovascular endurance, and generalized weakness.  He is motivated and has an established HEP with good social support from wife and father.  He was encouraged to return in a few months with a new referral to re-visit therapy needs, but at this time therapist feels it appropriate to discharge and have pt focus on low level walking progression and functional exercises.  He will likely benefit from episodic style therapy services in the future with edu provided to pt and wife with both in agreement.  OBJECTIVE IMPAIRMENTS Abnormal gait, decreased activity tolerance, decreased balance, decreased coordination, decreased endurance, decreased knowledge of condition, decreased knowledge of use of DME, decreased mobility, difficulty walking, decreased ROM, decreased strength, increased edema, and impaired sensation.   ACTIVITY LIMITATIONS carrying, standing, bathing, and locomotion level  PARTICIPATION LIMITATIONS: community activity  PERSONAL FACTORS Age, Education, Fitness, Past/current experiences, Time since onset of injury/illness/exacerbation, and 3+ comorbidities: multiple CVA affecting bilaterally, COPD stage 3, and HTN  are also affecting patient's functional outcome.   REHAB POTENTIAL: Fair See personal factors and PMH  CLINICAL DECISION MAKING: Evolving/moderate complexity  EVALUATION COMPLEXITY: Moderate  PLAN: PT FREQUENCY: 1x/week  PT DURATION: 12 weeks  PLANNED INTERVENTIONS: Therapeutic exercises, Therapeutic activity, Neuromuscular re-education, Balance training, Gait training, Patient/Family education, Joint mobilization, Stair training, Vestibular training, DME instructions, Manual therapy, and Re-evaluation  PLAN FOR NEXT SESSION:  Assess LTG-D/C!  Update HEP for LE strength and balance prn, stair training, gait training w/ and w/o AD, SciFit for endurance, monitor spO2, hip flexion activities, step ups, continue ladder drills for step length, postural  exercises    Bary Richard, PT, DPT 04/30/2022, 3:16 PM

## 2022-05-19 ENCOUNTER — Ambulatory Visit: Payer: Medicare HMO | Admitting: Adult Health

## 2022-05-19 ENCOUNTER — Encounter: Payer: Self-pay | Admitting: Adult Health

## 2022-05-19 VITALS — BP 142/84 | HR 77 | Ht 66.0 in | Wt 255.4 lb

## 2022-05-19 DIAGNOSIS — Z8673 Personal history of transient ischemic attack (TIA), and cerebral infarction without residual deficits: Secondary | ICD-10-CM

## 2022-05-19 DIAGNOSIS — Z720 Tobacco use: Secondary | ICD-10-CM | POA: Diagnosis not present

## 2022-05-19 NOTE — Patient Instructions (Addendum)
Please let me know when you would like to restart physical therapy and I can place these orders  Continue clopidogrel 75 mg daily  and Crestor '20mg'$  daily  for secondary stroke prevention  Continue to follow up with PCP regarding cholesterol and blood pressure management  Maintain strict control of hypertension with blood pressure goal below 130/90 and cholesterol with LDL cholesterol (bad cholesterol) goal below 70 mg/dL.   Signs of a Stroke? Follow the BEFAST method:  Balance Watch for a sudden loss of balance, trouble with coordination or vertigo Eyes Is there a sudden loss of vision in one or both eyes? Or double vision?  Face: Ask the person to smile. Does one side of the face droop or is it numb?  Arms: Ask the person to raise both arms. Does one arm drift downward? Is there weakness or numbness of a leg? Speech: Ask the person to repeat a simple phrase. Does the speech sound slurred/strange? Is the person confused ? Time: If you observe any of these signs, call 911.  Highly recommend complete tobacco cessation as continued use greatly increases risk of additional stroke and heart disease       Thank you for coming to see [place] at [facility]. I hope we have been able to provide you high quality care today.  You may receive a patient satisfaction survey over the next few weeks. We would appreciate your feedback and comments so that we may continue to improve ourselves and the health of our patients.

## 2022-05-19 NOTE — Progress Notes (Signed)
[facility] [address] [place]. [place] [address] [phone]       OFFICE FOLLOW UP NOTE  Mr. Todd Elliott Date of Birth:  August 14, 1956 Medical Record Number:  [id number]    Primary neurologist: Dr. Krista Blue Reason for visit: Stroke follow-up    SUBJECTIVE:   CHIEF COMPLAINT:  Chief Complaint  Patient presents with   Multiple strokes    Rm 3, 4 month FU wife- Mayra Reel "never started Chantix"    HPI:   Update 05/19/2022 JM: Patient returns for stroke follow-up accompanied by his wife.  Reports continued to have bilateral numbness, can fluctuate, some days worse than others.  Denies new stroke/TIA symptoms.  Completed PT last month, met max rehab potential, PT rec'd consideration of reevaluation in a couple months.  Continues HEP at home.  Continues to ambulate with a cane.  Compliant on Plavix and Crestor, denies side effects.  Lipid panel back in July showed LDL 45.  Blood pressure today 142/84, monitors at home and has been stable.  Routinely follows with PCP and cardiology.  Reports continued tobacco use about half pack per day, some days can smoke less and other days more.       History provided for reference purposes only Update 01/13/2022 JM: Patient returns for follow-up visit after prior initial visit with Dr. Krista Blue approximately 3 months ago.  He is accompanied by his wife.  He continues to have left-sided numbness but over the past 2 months, he had acute onset right-sided numbness on head, arm and leg.  Denies any pain associated with numbness, more just bothersome.  Previously having issues with left leg edema and now edema bilaterally. Cardiology recently increase Lasix dosage.  His balance remains poor which is baseline.  Denies any weakness on left or right side, visual changes, speech changes, worsening cognition or worsening balance. Reports compliance on Plavix and Crestor, denies side effects.  Cardiology recently increase Crestor dosage to 20 mg  daily and plans on repeat labs in 2 months.  Blood pressure today 148/94.  Occasionally monitors at home and typically 130s/80s.  He continues to smoke approximately 1 pack/day.  Reports trying to quit in the past but was not successful.  Reports taking Chantix for 2 days but stopped as it was not helpful.  He continues to have difficulty with shortness of breath and is closely being followed by pulmonology.  He is also closely followed by cardiology and PCP.  He completed MRI back in March which did not show any acute findings but did show old strokes within the bilateral thalamus, bilateral dorsal pontine infarcts, periventricular and deep cerebral white matter infarcts and small foci of low SWI signal within left llamas and bilateral cerebral hemispheres compatible with prior microhemorrhages.  He has since had full evaluation with cardiology with 2D echo with no evidence of PFO or LV thrombus and 30-day cardiac monitor negative.  Carotid ultrasound showed bilateral ICA 1 to 39% stenosis.  LE Dopplers negative for DVT.  Prior lab work in 08/2021 showed LDL 107 and in 09/2021 A1c 5.6.  No further concerns at this time    Consult visit 09/28/2021 Dr. Krista Blue (history provided for reference purposes only) Todd Elliott is a 66 year old male, seen in request by his primary care doctor Lazoff, Waldemar Dickens, for evaluation of left sided numbness, initial evaluation was on September 28, 2021.   I reviewed and summarized the referring note. PMHX HTN Hypothyroidism HLD Smoke 1ppd x 50 years. Obesity Snoring,  In 2022, he woke up 1 day noticed dense numbness from left side head, face, left upper extremity, left lower extremity, some subjective weakness, numbness has been persistent since its onset  He attempted MRI once in November 2022 without success due to severe claustrophobia, MRI under general anesthesia is pending in March 2023 ordered by his primary care physician  Prior to the event, he is already  on aspirin 81 mg daily,   He also complains few months history of tender left lower lower extremity, Sweating   He also has mild snoring, obesity, COPD, was seen by pulmonologist, suggest sleep study, he declined at this point      ROS:   14 system review of systems performed and negative with exception of those listed in HPI  PMH:  Past Medical History:  Diagnosis Date   Adrenal hyperplasia ([facility])    Anal fistula    CHRONIC   COPD (chronic obstructive pulmonary disease) ([facility])    Dyslipidemia    ED (erectile dysfunction)    Hemorrhoid    Hypercholesterolemia    Hypertension    Hypothyroidism    Left sided numbness    Stroke ([place])     PSH:  Past Surgical History:  Procedure Laterality Date   ANAL FISTULOTOMY N/A 01/30/2014   Procedure: ANAL FISTULOTOMY WITH DRAIN PLACEMENT;  Surgeon: [name], MD;  Location: [facility];  Service: General;  Laterality: N/A;   CHOLECYSTECTOMY  2013   COLONOSCOPY     EVALUATION UNDER ANESTHESIA WITH ANAL FISTULECTOMY N/A 06/26/2014   Procedure: ANAL EXAM UNDER ANESTHESIA ;  Surgeon: [name], MD;  Location: [facility];  Service: General;  Laterality: N/A;   EVALUATION UNDER ANESTHESIA WITH ANAL FISTULECTOMY N/A 04/17/2015   Procedure: EXAM UNDER ANESTHESIA WITH ANAL FISTULOTOMY;  Surgeon: [name], MD;  Location: [facility];  Service: General;  Laterality: N/A;   EXAMINATION UNDER ANESTHESIA N/A 01/30/2014   Procedure: EXAM UNDER ANESTHESIA;  Surgeon: [name], MD;  Location: [facility];  Service: General;  Laterality: N/A;   INCISION AND DRAINAGE PERIRECTAL ABSCESS Left 12/27/2013   Procedure: IRRIGATION AND DEBRIDEMENT PERIRECTAL ABSCESS;  Surgeon: Edward Jolly, MD;  Location: [facility];  Service: General;  Laterality: Left;   INCISION AND DRAINAGE PERIRECTAL ABSCESS N/A 02/10/2014   Procedure: IRRIGATION AND DEBRIDEMENT PERIRECTAL ABSCESS;  Surgeon: [name], MD;  Location: [facility];  Service: General;  Laterality: N/A;   INCISION AND DRAINAGE PERIRECTAL ABSCESS  06/26/2014   Procedure: IRRIGATION AND DEBRIDEMENT PERIRECTAL ABSCESS;  Surgeon: [name], MD;  Location: [facility];  Service: General;;   LAPAROSOPCY CONVERTED TO OPEN ILEOCECOSTOMY FOR GANGRENOUS RUPTURED APPENDIX  08-11-2005   ORIF LEFT SCAPHOID FX MID SHAFT  12-27-2003   ORIF RADIUS FX RIGHT MID SHAFT/ CLOSED REDUCTION RADIUS AND ULNAR DISLOCATION WRIST/  I & D AND CLOSURE  11-21-2003   VENTRAL HERNIA REPAIR  2013    Social History:  Social History   Socioeconomic History   Marital status: Married    Spouse name: Laurene   Number of children: 3   Years of education: 12   Highest education level: High school graduate  Occupational History   Occupation: Retired  Tobacco Use   Smoking status: Every Day    Packs/day: 0.25    Years: 35.00    Total pack years: 8.75    Types: Cigarettes   Smokeless tobacco: Never  Substance and Sexual Activity   Alcohol  use: Yes    Comment: occasional use   Drug use: No   Sexual activity: Not on file  Other Topics Concern   Not on file  Social History Narrative   Lives at home with his wife.   Right-handed.   Caffeine use: 4 cups per day.   Social Determinants of Health   Financial Resource Strain: Not on file  Food Insecurity: Not on file  Transportation Needs: Not on file  Physical Activity: Not on file  Stress: Not on file  Social Connections: Not on file  Intimate Partner Violence: Not on file    Family History:  Family History  Problem Relation Age of Onset   Thyroid disease Mother    Thyroid disease Father    Colon cancer Father 15   Cancer Father        colon   Thyroid disease Sister    Thyroid disease Sister    Heart disease Maternal Grandfather    Cancer Maternal Grandfather        lymphoma   Diabetes Maternal Aunt        x2   Diabetes Maternal Uncle    Diabetes Other        Mat  great GM    Medications:   Current Outpatient Medications on File Prior to Visit  Medication Sig Dispense Refill   albuterol (VENTOLIN HFA) 108 (90 Base) MCG/ACT inhaler Inhale 1-2 puffs into the lungs as needed. Every 4-6 hours as needed.     clopidogrel (PLAVIX) 75 MG tablet Take 1 tablet (75 mg total) by mouth daily. 30 tablet 11   hydrochlorothiazide (HYDRODIURIL) 25 MG tablet Take 1 tablet (25 mg total) by mouth daily. 90 tablet 3   levothyroxine (SYNTHROID, LEVOTHROID) 175 MCG tablet Take 175 mcg by mouth as directed. Take 1.5 tabs on Mondays & Fridays. Take 1 tab daily all other days.     lisinopril (ZESTRIL) 40 MG tablet Take 40 mg by mouth daily.     potassium chloride SA (KLOR-CON M) 20 MEQ tablet Take 20 mEq by mouth daily.     rosuvastatin (CRESTOR) 20 MG tablet Take 1 tablet (20 mg total) by mouth daily. 90 tablet 3   silodosin (RAPAFLO) 8 MG CAPS capsule Take 8 mg by mouth daily.     Tiotropium Bromide-Olodaterol (STIOLTO RESPIMAT) 2.5-2.5 MCG/ACT AERS Inhale 2 puffs into the lungs daily.     vitamin B-12 (CYANOCOBALAMIN) 1000 MCG tablet Take 1,000 mcg by mouth daily.     VITAMIN D PO Take 25 mcg by mouth daily.     varenicline (CHANTIX PAK) 0.5 MG X 11 & 1 MG X 42 tablet Take one 0.5 mg tablet by mouth once daily for 3 days, then increase to one 0.5 mg tablet twice daily for 4 days, then increase to one 1 mg tablet twice daily. (Patient not taking: Reported on 05/19/2022) 53 tablet 0   varenicline (CHANTIX) 1 MG tablet Take 1 tablet (1 mg total) by mouth 2 (two) times daily. Begin after completing starter pak (Patient not taking: Reported on 05/19/2022) 60 tablet 1   No current facility-administered medications on file prior to visit.    Allergies:  No Known Allergies    OBJECTIVE:  Physical Exam  Vitals:   05/19/22 1325  BP: (!) 142/84  Pulse: 77  Weight: 255 lb 6.4 oz (115.8 kg)  Height: _0  (1.676 m)    Body mass index is 41.22 kg/m. No results  found.  General: well developed,  well nourished, very pleasant elderly Caucasian male, seated, in no evident distress although becomes short of breath easily Head: head normocephalic and atraumatic.   Neck: supple with no carotid or supraclavicular bruits Cardiovascular: regular rate and rhythm, no murmurs Musculoskeletal: no deformity Skin:  no rash/petichiae; R>L LE pitting edema  Vascular:  Normal pulses all extremities   Neurologic Exam Mental Status: Awake and fully alert.  Fluent speech and language.  Oriented to place and time. Recent memory impaired and remote memory intact. Attention span, concentration and fund of knowledge mostly appropriate during visit. Mood and affect appropriate.  Cranial Nerves: Pupils equal, briskly reactive to light. Extraocular movements full without nystagmus. Visual fields full to confrontation. Hearing intact. Facial sensation intact. Face, tongue, palate moves normally and symmetrically.  Motor: Normal bulk and tone. Normal strength in all tested extremity muscles Sensory.: intact to touch , pinprick , position and vibratory sensation although subjective numbness complete right and left side Coordination: Rapid alternating movements normal in all extremities. Finger-to-nose and heel-to-shin performed accurately bilaterally. Gait and Station: Arises from chair with mild difficulty. Stance is slightly hunched. Gait demonstrates decreased stride length and step height bilaterally with imbalance/unsteadiness and use of cane. Tandem walk and heel toe not attempted Reflexes: 1+ and symmetric. Toes downgoing.    PERTINENT IMAGING  MR BRAIN W WO CONTRAST 10/15/2021 (View impression in care everywhere) IMPRESSION: 1.  Moderately limited exam secondary to patient motion.  2.  No acute intracranial abnormality.  3.  Background of extensive presumed chronic ischemic microvascular disease and multiple prior infarcts including the bilateral dorsal pons, bilateral  thalami, and the cerebral white matter.   2D ECHO 10/13/2021 IMPRESSIONS   1. Left ventricular ejection fraction, by estimation, is 60 to 65%. Left  ventricular ejection fraction by 3D volume is 58 %. The left ventricle has  normal function. The left ventricle has no regional wall motion  abnormalities. Left ventricular diastolic   parameters are consistent with Grade I diastolic dysfunction (impaired  relaxation).   2. Right ventricular systolic function is normal. The right ventricular  size is normal.   3. The mitral valve is normal in structure. No evidence of mitral valve  regurgitation. No evidence of mitral stenosis.   4. The aortic valve is normal in structure. Aortic valve regurgitation is  not visualized. No aortic stenosis is present.   5. The inferior vena cava is normal in size with greater than 50%  respiratory variability, suggesting right atrial pressure of 3 mmHg.   2D ECHO LIMITED BUBBLE STUDY 11/06/2021 IMPRESSIONS   1. Left ventricular ejection fraction, by estimation, is 60 to 65%. The  left ventricle has normal function. Left ventricular diastolic parameters  are indeterminate.   2. Right ventricular systolic function is normal.   3. No evidence of mitral valve regurgitation.   4. There is mild calcification of the aortic valve. Aortic valve  regurgitation is not visualized.   5. Agitated saline contrast bubble study was negative, with no evidence  of any interatrial shunt.   CAROTID DUPLEX 11/13/2021 Summary:  Right Carotid: Velocities in the right ICA are consistent with a 1-39% stenosis.  Left Carotid: Velocities in the left ICA are consistent with a 1-39% stenosis.  Vertebrals:  Bilateral vertebral arteries demonstrate antegrade flow.  Subclavians: Normal flow hemodynamics were seen in bilateral subclavian arteries.   CTA HEAD 02/08/2022 IMPRESSION: No intracranial large vessel occlusion. Head CT images show advanced chronic small-vessel ischemic  changes throughout the brain as outlined above.  Atherosclerotic calcification in the carotid siphon regions but without stenosis greater than 30%. Moderate stenoses of the proximal M2 branches on both sides. Moderate to severe stenosis of the azygos anterior cerebral artery at the genu. 30% stenoses of both vertebral artery V4 segments. Atherosclerotic irregularity of the more distal PCA branch vessels.  LOWER EXTREMITY VENOUS 10/13/2021 Summary:  RIGHT:  - No evidence of common femoral vein obstruction.  LEFT:  - There is no evidence of deep vein thrombosis in the lower extremity.  - No cystic structure found in the popliteal fossa.             ASSESSMENT  Todd Elliott is a 66 y.o. year old male with sudden onset left-sided hemiparesthesias around 08/2020 and right sided hemiparesthesias around 10/2021.  MRI completed 10/2021 (required general anesthesia for completion hence the delay of completing since onset) which showed multiple areas of old strokes including bilateral thalami, bilateral dorsal pontine infarcts, white matter infarcts and prior microhemorrhages in the left thalamus and bilateral cerebral hemispheres.  Vascular risk factors include HTN, HLD, tobacco use, hypothyroidism, obesity, B12 deficiency and suspected sleep apnea.      PLAN:  Multiple chronic strokes:  Residual deficits: Bilateral paresthesias and gait impairment. Completed PT - met max rehab potential, discussed continued HEP, consider reevaluation in a couple months per PT rec's -he was advised to call if he wishes to proceed.  Continue use of cane at all times unless otherwise instructed CTA head 01/2022: Proximal cerebellum,, and basal ganglia, extensive chronic small vessel ischemic changes of the pons, and intracranial stenosis Discussed importance of proceeding to ED immediately with any new or worsening stroke/TIA symptoms for emergent evaluation Continue Plavix 75 mg daily and Crestor 20 mg  daily for secondary stroke prevention measures.  Discussed lifelong use of these medications which can be managed by PCP/cardiology continued to follow-up with cardiology/PCP for HTN management with BP goal<130/90 Stroke labs: LDL 45 (02/2022), A1c 5.7 (05/2021)  At risk for sleep apnea: Discussed possible underlying sleep apnea.  He previously declined further evaluation with pulmonology.  Discussed the risks and ramifications of untreated OSA especially with multiple prior strokes seen on imaging and longstanding HTN.  He continues to decline interest but will further discuss with pulmonology if he wishes to proceed with the future  Tobacco use: Discussed ongoing use of tobacco greatly increases risk of additional strokes.  He was advised to follow-up with PCP if further assistance is needed.  He verbalized understanding.     Overall stable from stroke standpoint without further recommendations and risk factors are being managed by PCP and cardiology.  Recommend follow-up on an as-needed basis.   CC:  [facility] provider: Dr. Krista Blue PCP: Rayann Heman, Shawn P, DO    I spent 31 minutes of face-to-face and non-face-to-face time with patient and wife.  This included previsit chart review, lab review, study review, electronic health record documentation, patient and wife education and discussion regarding above diagnoses and treatment plan and answered all questions to patient wife satisfaction  Frann Rider, AGNP-BC  [facility] [address] [place], [place] [address]  Phone [phone] Fax [phone] Note: This document was prepared with digital dictation and possible smart phrase technology. Any transcriptional errors that result from this process are unintentional.

## 2022-12-09 ENCOUNTER — Other Ambulatory Visit: Payer: Self-pay | Admitting: Neurology

## 2023-01-06 ENCOUNTER — Other Ambulatory Visit: Payer: Self-pay | Admitting: Neurology

## 2023-01-06 ENCOUNTER — Other Ambulatory Visit: Payer: Self-pay | Admitting: Internal Medicine

## 2023-01-07 ENCOUNTER — Other Ambulatory Visit: Payer: Self-pay | Admitting: Internal Medicine

## 2023-01-07 ENCOUNTER — Other Ambulatory Visit: Payer: Self-pay | Admitting: Neurology

## 2023-01-11 ENCOUNTER — Other Ambulatory Visit: Payer: Self-pay

## 2023-01-11 MED ORDER — CLOPIDOGREL BISULFATE 75 MG PO TABS: 75.0000 mg | ORAL_TABLET | Freq: Every day | ORAL | 0 refills | Status: AC

## 2023-07-21 DIAGNOSIS — I10 Essential (primary) hypertension: Secondary | ICD-10-CM | POA: Insufficient documentation

## 2023-07-21 DIAGNOSIS — I639 Cerebral infarction, unspecified: Secondary | ICD-10-CM | POA: Insufficient documentation

## 2023-07-21 DIAGNOSIS — E785 Hyperlipidemia, unspecified: Secondary | ICD-10-CM | POA: Insufficient documentation

## 2023-07-21 NOTE — Progress Notes (Signed)
Cardiology Office Note:    Date:  07/22/2023  ID:  Todd Elliott, DOB [birth year], MRN [id number] PCP: Mattie Marlin, DO  [facility] Providers Cardiologist:  Orbie Pyo, MD       Patient Profile:      Hx of CVA MRI in 10/2021: chronic ischemic microvascular disease and multiple prior infarcts including bilateral pons, bilateral thalami, and cerebral white matter  TTE 10/13/21: EF 60-65, no RWMA, Gr 1 DD, NL RVSF, RAP 3  TTE w Bubble Study 11/06/21: EF 60-65, NL RVSF, bubble study neg Carotid US 11/13/21: Bilat ICA 1-39  Monitor 10/2021: NSVT x 6 sec, o/w NSR, no AFib  Hypertension  Hyperlipidemia  Chronic kidney disease  Chronic Obstructive Pulmonary Disease Aortic atherosclerosis  Leg edema          History of Present Illness:  Discussed the use of AI scribe software for clinical note transcription with the patient, who gave verbal consent to proceed.  Todd Elliott is a 67 y.o. male who returns for follow-up of hypertension, hyperlipidemia.  He was last seen by Dr. Lynnette Caffey in 03/2022.  He is here with his wife.  Since his stroke last year, he notes bilateral numbness.  He has not had any improvement.  I reviewed the notes from neurology.  He completed physical therapy.  No other recommendations were made.  We discussed referral back to neurology but he does not want to do this.  He has not had chest pain.  He has chronic shortness of breath related to COPD.  He has not had syncope, orthopnea, leg edema.  He continues to smoke.       ROS   See HPI    Studies Reviewed:       Labs reviewed (CareEverywhere) 07/08/23: K 4.1, SCr 1.33, eGFR 59, ALT 11, TC 158, Tg 141, HDL 31, LDL 103   EKG today personally reviewed.  NSR, HR 78, normal axis, QTc 435, no change since prior tracing           Risk Assessment/Calculations:             Physical Exam:   VS:  BP 132/82   Pulse 78   Ht 5\' 6"  (1.676 m)   Wt 225 lb 9.6 oz (102.3 kg)   SpO2 97%   BMI 36.41 kg/m     Wt Readings from Last 3 Encounters:  07/22/23 225 lb 9.6 oz (102.3 kg)  05/19/22 255 lb 6.4 oz (115.8 kg)  04/02/22 259 lb (117.5 kg)    Constitutional:      Appearance: Healthy appearance. Not in distress.  Neck:     Vascular: JVD normal.  Pulmonary:     Breath sounds: Scattered Wheezing present. No rales.  Cardiovascular:     Normal rate. Regular rhythm.     Murmurs: There is no murmur.  Edema:    Peripheral edema absent.  Abdominal:     Palpations: Abdomen is soft.        Assessment and Plan:   Assessment & Plan Hyperlipidemia LDL goal <70 LDL in November 2024 was above goal.  He has been adherent to therapy with rosuvastatin.  Continue rosuvastatin 20 mg daily.  Start Zetia 10 mg daily.  Obtain fasting CMET, lipids in 3 months. Essential hypertension Fair control.  Continue to monitor at home.  Continue HCTZ 12.5 mg daily, lisinopril 40 mg daily.  If blood pressure increases, consider adding amlodipine. Cerebrovascular accident (CVA), unspecified mechanism (HCC) He  underwent extensive workup last year with minimal nonobstructive plaque on carotid US, no atrial fibrillation on event monitoring and no evidence of PFO on echo with bubble study.  He remains on Plavix.  We discussed the importance of good risk factor modification to prevent recurrence.  We also discussed the importance of quitting smoking to prevent recurrent stroke, MI or cancer.  I offered to refer him back to neurology to see if there was any assistance that could be provided to help with his global paresthesias.  However he declines. Tobacco use As noted, we discussed the importance of quitting.        Dispo:  Return in about 1 year (around 07/21/2024) for Routine Follow Up, w/ Dr. Lynnette Caffey.  Signed, Tereso Newcomer, PA-C

## 2023-07-22 ENCOUNTER — Ambulatory Visit: Payer: Medicare HMO | Attending: Physician Assistant | Admitting: Physician Assistant

## 2023-07-22 ENCOUNTER — Encounter: Payer: Self-pay | Admitting: Physician Assistant

## 2023-07-22 ENCOUNTER — Other Ambulatory Visit: Payer: Self-pay | Admitting: *Deleted

## 2023-07-22 VITALS — BP 132/82 | HR 78 | Ht 66.0 in | Wt 225.6 lb

## 2023-07-22 DIAGNOSIS — E785 Hyperlipidemia, unspecified: Secondary | ICD-10-CM | POA: Diagnosis not present

## 2023-07-22 DIAGNOSIS — Z720 Tobacco use: Secondary | ICD-10-CM

## 2023-07-22 DIAGNOSIS — I639 Cerebral infarction, unspecified: Secondary | ICD-10-CM | POA: Diagnosis not present

## 2023-07-22 DIAGNOSIS — I10 Essential (primary) hypertension: Secondary | ICD-10-CM | POA: Diagnosis not present

## 2023-07-22 MED ORDER — EZETIMIBE 10 MG PO TABS: 10.0000 mg | ORAL_TABLET | Freq: Every day | ORAL | 3 refills | Status: DC

## 2023-07-22 NOTE — Assessment & Plan Note (Signed)
Fair control.  Continue to monitor at home.  Continue HCTZ 12.5 mg daily, lisinopril 40 mg daily.  If blood pressure increases, consider adding amlodipine.

## 2023-07-22 NOTE — Patient Instructions (Signed)
Medication Instructions:  Your physician has recommended you make the following change in your medication:   START Zetia 10 mg taking 1 daily  *If you need a refill on your cardiac medications before your next appointment, please call your pharmacy*   Lab Work: MARCH 2025, YOU CAN GO TO ANY [facility] FOR LABS.  MAKE SURE TO FAST:  CMET & LIPID   If you have labs (blood work) drawn today and your tests are completely normal, you will receive your results only by: MyChart Message (if you have MyChart) OR A paper copy in the mail If you have any lab test that is abnormal or we need to change your treatment, we will call you to review the results.   Testing/Procedures: None ordered   Follow-Up: At [facility], you and your health needs are our priority.  As part of our continuing mission to provide you with exceptional heart care, we have created designated Provider Care Teams.  These Care Teams include your primary Cardiologist (physician) and Advanced Practice Providers (APPs -  Physician Assistants and Nurse Practitioners) who all work together to provide you with the care you need, when you need it.  We recommend signing up for the patient portal called "MyChart".  Sign up information is provided on this After Visit Summary.  MyChart is used to connect with patients for Virtual Visits (Telemedicine).  Patients are able to view lab/test results, encounter notes, upcoming appointments, etc.  Non-urgent messages can be sent to your provider as well.   To learn more about what you can do with MyChart, go to [email or web address].    Your next appointment:   12 month(s)  Provider:   Orbie Pyo, MD     Other Instructions

## 2023-07-22 NOTE — Assessment & Plan Note (Signed)
LDL in November 2024 was above goal.  He has been adherent to therapy with rosuvastatin.  Continue rosuvastatin 20 mg daily.  Start Zetia 10 mg daily.  Obtain fasting CMET, lipids in 3 months.

## 2023-07-22 NOTE — Assessment & Plan Note (Signed)
He underwent extensive workup last year with minimal nonobstructive plaque on carotid US, no atrial fibrillation on event monitoring and no evidence of PFO on echo with bubble study.  He remains on Plavix.  We discussed the importance of good risk factor modification to prevent recurrence.  We also discussed the importance of quitting smoking to prevent recurrent stroke, MI or cancer.  I offered to refer him back to neurology to see if there was any assistance that could be provided to help with his global paresthesias.  However he declines.

## 2024-07-23 ENCOUNTER — Other Ambulatory Visit: Payer: Self-pay | Admitting: Physician Assistant

## 2024-07-24 ENCOUNTER — Other Ambulatory Visit: Payer: Self-pay | Admitting: Physician Assistant
